# Patient Record
Sex: Female | Born: 1940
Health system: Southern US, Community
[De-identification: ages and names within clinical notes are randomized; demographics above are authoritative.]

## PROBLEM LIST (undated history)

## (undated) DIAGNOSIS — M67919 Unspecified disorder of synovium and tendon, unspecified shoulder: Secondary | ICD-10-CM

## (undated) DIAGNOSIS — M159 Polyosteoarthritis, unspecified: Secondary | ICD-10-CM

## (undated) DIAGNOSIS — K449 Diaphragmatic hernia without obstruction or gangrene: Secondary | ICD-10-CM

## (undated) DIAGNOSIS — I10 Essential (primary) hypertension: Secondary | ICD-10-CM

## (undated) DIAGNOSIS — K644 Residual hemorrhoidal skin tags: Secondary | ICD-10-CM

## (undated) DIAGNOSIS — M1990 Unspecified osteoarthritis, unspecified site: Secondary | ICD-10-CM

## (undated) DIAGNOSIS — I8390 Asymptomatic varicose veins of unspecified lower extremity: Secondary | ICD-10-CM

## (undated) DIAGNOSIS — M25512 Pain in left shoulder: Secondary | ICD-10-CM

## (undated) DIAGNOSIS — M719 Bursopathy, unspecified: Secondary | ICD-10-CM

## (undated) DIAGNOSIS — H269 Unspecified cataract: Secondary | ICD-10-CM

## (undated) DIAGNOSIS — D126 Benign neoplasm of colon, unspecified: Secondary | ICD-10-CM

## (undated) DIAGNOSIS — M25559 Pain in unspecified hip: Secondary | ICD-10-CM

## (undated) DIAGNOSIS — N72 Inflammatory disease of cervix uteri: Secondary | ICD-10-CM

## (undated) DIAGNOSIS — J3489 Other specified disorders of nose and nasal sinuses: Secondary | ICD-10-CM

## (undated) DIAGNOSIS — K2100 Gastro-esophageal reflux disease with esophagitis, without bleeding: Secondary | ICD-10-CM

## (undated) DIAGNOSIS — M25511 Pain in right shoulder: Secondary | ICD-10-CM

## (undated) DIAGNOSIS — M949 Disorder of cartilage, unspecified: Secondary | ICD-10-CM

## (undated) DIAGNOSIS — M25579 Pain in unspecified ankle and joints of unspecified foot: Secondary | ICD-10-CM

## (undated) DIAGNOSIS — E669 Obesity, unspecified: Secondary | ICD-10-CM

## (undated) DIAGNOSIS — M899 Disorder of bone, unspecified: Secondary | ICD-10-CM

## (undated) DIAGNOSIS — H6120 Impacted cerumen, unspecified ear: Secondary | ICD-10-CM

## (undated) DIAGNOSIS — E559 Vitamin D deficiency, unspecified: Secondary | ICD-10-CM

## (undated) DIAGNOSIS — K219 Gastro-esophageal reflux disease without esophagitis: Secondary | ICD-10-CM

## (undated) DIAGNOSIS — M25519 Pain in unspecified shoulder: Secondary | ICD-10-CM

## (undated) DIAGNOSIS — N6019 Diffuse cystic mastopathy of unspecified breast: Secondary | ICD-10-CM

## (undated) HISTORY — PX: UPPER GASTROINTESTINAL ENDOSCOPY: SHX188

## (undated) HISTORY — DX: Other specified disorders of nose and nasal sinuses: J34.89

## (undated) HISTORY — DX: Pain in unspecified shoulder: M25.519

## (undated) HISTORY — DX: Pain in unspecified ankle and joints of unspecified foot: M25.579

## (undated) HISTORY — DX: Pain in right shoulder: M25.511

## (undated) HISTORY — DX: Disorder of bone, unspecified: M89.9

## (undated) HISTORY — DX: Essential (primary) hypertension: I10

## (undated) HISTORY — DX: Residual hemorrhoidal skin tags: K64.4

## (undated) HISTORY — DX: Polyosteoarthritis, unspecified: M15.9

## (undated) HISTORY — DX: Bursopathy, unspecified: M71.9

## (undated) HISTORY — DX: Disorder of cartilage, unspecified: M94.9

## (undated) HISTORY — DX: Pain in unspecified hip: M25.559

## (undated) HISTORY — DX: Diffuse cystic mastopathy of unspecified breast: N60.19

## (undated) HISTORY — DX: Inflammatory disease of cervix uteri: N72

## (undated) HISTORY — DX: Obesity, unspecified: E66.9

## (undated) HISTORY — DX: Gastro-esophageal reflux disease with esophagitis, without bleeding: K21.00

## (undated) HISTORY — DX: Vitamin D deficiency, unspecified: E55.9

## (undated) HISTORY — DX: Pain in left shoulder: M25.512

## (undated) HISTORY — DX: Unspecified disorder of synovium and tendon, unspecified shoulder: M67.919

## (undated) HISTORY — DX: Gastro-esophageal reflux disease without esophagitis: K21.9

## (undated) HISTORY — DX: Benign neoplasm of colon, unspecified: D12.6

## (undated) HISTORY — DX: Unspecified cataract: H26.9

## (undated) HISTORY — DX: Gastro-esophageal reflux disease with esophagitis: K21.0

## (undated) HISTORY — DX: Diaphragmatic hernia without obstruction or gangrene: K44.9

## (undated) HISTORY — DX: Unspecified osteoarthritis, unspecified site: M19.90

## (undated) HISTORY — DX: Impacted cerumen, unspecified ear: H61.20

## (undated) HISTORY — DX: Asymptomatic varicose veins of unspecified lower extremity: I83.90

---
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
## 1988-10-02 HISTORY — PX: FLEXIBLE SIGMOIDOSCOPY: SHX1649

## 1998-04-30 ENCOUNTER — Other Ambulatory Visit: Admission: RE | Admit: 1998-04-30 | Discharge: 1998-04-30 | Payer: Self-pay | Admitting: Internal Medicine

## 1999-05-10 ENCOUNTER — Ambulatory Visit (HOSPITAL_COMMUNITY): Admission: RE | Admit: 1999-05-10 | Discharge: 1999-05-10 | Payer: Self-pay | Admitting: *Deleted

## 2000-06-01 ENCOUNTER — Other Ambulatory Visit: Admission: RE | Admit: 2000-06-01 | Discharge: 2000-06-01 | Payer: Self-pay | Admitting: Internal Medicine

## 2001-07-03 LAB — HM PAP SMEAR

## 2002-05-28 ENCOUNTER — Ambulatory Visit (HOSPITAL_COMMUNITY): Admission: RE | Admit: 2002-05-28 | Discharge: 2002-05-28 | Payer: Self-pay | Admitting: *Deleted

## 2002-05-28 ENCOUNTER — Encounter (INDEPENDENT_AMBULATORY_CARE_PROVIDER_SITE_OTHER): Payer: Self-pay | Admitting: Specialist

## 2003-10-03 HISTORY — PX: MOUTH SURGERY: SHX715

## 2004-10-02 HISTORY — PX: COLONOSCOPY: SHX174

## 2005-07-25 ENCOUNTER — Ambulatory Visit (HOSPITAL_COMMUNITY): Admission: RE | Admit: 2005-07-25 | Discharge: 2005-07-25 | Payer: Self-pay | Admitting: *Deleted

## 2009-02-02 ENCOUNTER — Encounter: Admission: RE | Admit: 2009-02-02 | Discharge: 2009-02-02 | Payer: Self-pay | Admitting: Internal Medicine

## 2009-09-21 ENCOUNTER — Encounter: Payer: Self-pay | Admitting: Internal Medicine

## 2010-01-28 ENCOUNTER — Encounter: Payer: Self-pay | Admitting: Internal Medicine

## 2010-05-25 ENCOUNTER — Encounter (INDEPENDENT_AMBULATORY_CARE_PROVIDER_SITE_OTHER): Payer: Self-pay | Admitting: *Deleted

## 2010-05-25 ENCOUNTER — Encounter: Payer: Self-pay | Admitting: Internal Medicine

## 2010-06-24 ENCOUNTER — Encounter (INDEPENDENT_AMBULATORY_CARE_PROVIDER_SITE_OTHER): Payer: Self-pay | Admitting: *Deleted

## 2010-06-27 ENCOUNTER — Ambulatory Visit: Payer: Self-pay | Admitting: Internal Medicine

## 2010-07-12 ENCOUNTER — Ambulatory Visit: Payer: Self-pay | Admitting: Internal Medicine

## 2010-07-23 ENCOUNTER — Encounter: Payer: Self-pay | Admitting: Internal Medicine

## 2010-11-01 NOTE — Letter (Signed)
Summary: Previsit letter  [facility]  [address] [place], [place] [address]   Phone: [phone]  Fax: [phone]       05/25/2010 MRN: [id number]  Danielle Pierce [address] [place], [place]  [address]  Dear Danielle Pierce,  Welcome to the Gastroenterology Division at [facility].    You are scheduled to see a nurse for your pre-procedure visit on 06/27/2010 at 10:00am  on the 3rd floor at [facility], [address]  We ask that you try to arrive at our office 15 minutes prior to your appointment time to allow for check-in.  Your nurse visit will consist of discussing your medical and surgical history, your immediate family medical history, and your medications.    Please bring a complete list of all your medications or, if you prefer, bring the medication bottles and we will list them.  We will need to be aware of both prescribed and over the counter drugs.  We will need to know exact dosage information as well.  If you are on blood thinners (Coumadin, Plavix, Aggrenox, Ticlid, etc.) please call our office today/prior to your appointment, as we need to consult with your physician about holding your medication.   Please be prepared to read and sign documents such as consent forms, a financial agreement, and acknowledgement forms.  If necessary, and with your consent, a friend or relative is welcome to sit-in on the nurse visit with you.  Please bring your insurance card so that we may make a copy of it.  If your insurance requires a referral to see a specialist, please bring your referral form from your primary care physician.  No co-pay is required for this nurse visit.     If you cannot keep your appointment, please call [phone] to cancel or reschedule prior to your appointment date.  This allows [place] the opportunity to schedule an appointment for another patient in need of care.    Thank you for choosing [facility] for your medical  needs.  We appreciate the opportunity to care for you.  Please visit [place] at our website  to learn more about our practice.                     Sincerely.                                                                                                                   The Gastroenterology Division

## 2010-11-01 NOTE — Letter (Signed)
Summary: [facility]   Imported By: Lester Straughn 07/14/2010 09:09:31  _____________________________________________________________________  External Attachment:    Type:   Image     Comment:   External Document

## 2010-11-01 NOTE — Miscellaneous (Signed)
Summary: previsit prep/rm  Clinical Lists Changes  Medications: Added new medication of MOVIPREP 100 GM  SOLR (PEG-KCL-NACL-NASULF-NA ASC-C) As per prep instructions. - Signed Rx of MOVIPREP 100 GM  SOLR (PEG-KCL-NACL-NASULF-NA ASC-C) As per prep instructions.;  #1 x 0;  Signed;  Entered by: Sherren Kerns RN;  Authorized by: Iva Boop MD, [facility];  Method used: Electronically to [facility]*, [address], [place], [place]  [id number], Ph: [phone], Fax: [phone] Observations: Added new observation of ALLERGY REV: Done (06/27/2010 9:40) Added new observation of NKA: T (06/27/2010 9:40)    Prescriptions: MOVIPREP 100 GM  SOLR (PEG-KCL-NACL-NASULF-NA ASC-C) As per prep instructions.  #1 x 0   Entered by:   Sherren Kerns RN   Authorized by:   Iva Boop MD, [facility]   Signed by:   Sherren Kerns RN on 06/27/2010   Method used:   Electronically to        [facility]* (retail)       [address]       [place], [place]  [id number]       Ph: [phone]       Fax: [phone]   RxID:   [id number]

## 2010-11-01 NOTE — Letter (Signed)
Summary: [facility] Instructions  [facility]  [address] [place], [place] [address]   Phone: [phone]  Fax: [phone]       Danielle Pierce    1941-07-13    MRN: [id number]        Procedure Day Dorna Bloom:  Jake Shark  07/12/10     Arrival Time:  10:30AM     Procedure Time:  11:30AM     Location of Procedure:                    _ X_  [facility] (4th Floor)                       PREPARATION FOR COLONOSCOPY WITH MOVIPREP   Starting 5 days prior to your procedure 07/07/10 do not eat nuts, seeds, popcorn, corn, beans, peas,  salads, or any raw vegetables.  Do not take any fiber supplements (e.g. Metamucil, Citrucel, and Benefiber).  THE DAY BEFORE YOUR PROCEDURE         DATE: 07/11/10  DAY: MONDAY  1.  Drink clear liquids the entire day-NO SOLID FOOD  2.  Do not drink anything colored red or purple.  Avoid juices with pulp.  No orange juice.  3.  Drink at least 64 oz. (8 glasses) of fluid/clear liquids during the day to prevent dehydration and help the prep work efficiently.  CLEAR LIQUIDS INCLUDE: Water Jello Ice Popsicles Tea (sugar ok, no milk/cream) Powdered fruit flavored drinks Coffee (sugar ok, no milk/cream) Gatorade Juice: apple, white grape, white cranberry  Lemonade Clear bullion, consomm, broth Carbonated beverages (any kind) Strained chicken noodle soup Hard Candy                             4.  In the morning, mix first dose of MoviPrep solution:    Empty 1 Pouch A and 1 Pouch B into the disposable container    Add lukewarm drinking water to the top line of the container. Mix to dissolve    Refrigerate (mixed solution should be used within 24 hrs)  5.  Begin drinking the prep at 5:00 p.m. The MoviPrep container is divided by 4 marks.   Every 15 minutes drink the solution down to the next mark (approximately 8 oz) until the full liter is complete.   6.  Follow completed prep with 16 oz of clear liquid of your choice  (Nothing red or purple).  Continue to drink clear liquids until bedtime.  7.  Before going to bed, mix second dose of MoviPrep solution:    Empty 1 Pouch A and 1 Pouch B into the disposable container    Add lukewarm drinking water to the top line of the container. Mix to dissolve    Refrigerate  THE DAY OF YOUR PROCEDURE      DATE: 07/12/10  DAY: TUESDAY  Beginning at 6:30AM (5 hours before procedure):         1. Every 15 minutes, drink the solution down to the next mark (approx 8 oz) until the full liter is complete.  2. Follow completed prep with 16 oz. of clear liquid of your choice.    3. You may drink clear liquids until 9:30AM (2 HOURS BEFORE PROCEDURE).   MEDICATION INSTRUCTIONS  Unless otherwise instructed, you should take regular prescription medications with a small sip of water   as early as possible the morning of  your procedure.  Additional medication instructions: Hold losartan/HCTZ pill morning of procedure        OTHER INSTRUCTIONS  You will need a responsible adult at least 69 years of age to accompany you and drive you home.   This person must remain in the waiting room during your procedure.  Wear loose fitting clothing that is easily removed.  Leave jewelry and other valuables at home.  However, you may wish to bring a book to read or  an iPod/MP3 player to listen to music as you wait for your procedure to start.  Remove all body piercing jewelry and leave at home.  Total time from sign-in until discharge is approximately 2-3 hours.  You should go home directly after your procedure and rest.  You can resume normal activities the  day after your procedure.  The day of your procedure you should not:   Drive   Make legal decisions   Operate machinery   Drink alcohol   Return to work  You will receive specific instructions about eating, activities and medications before you leave.    The above instructions have been reviewed and  explained to me by  Sherren Kerns RN  June 27, 2010 10:01 AM      I fully understand and can verbalize these instructions _____________________________ Date _________

## 2010-11-01 NOTE — Letter (Signed)
Summary: [facility]   Imported By: Lester Taunton 07/14/2010 09:07:26  _____________________________________________________________________  External Attachment:    Type:   Image     Comment:   External Document

## 2010-11-01 NOTE — Letter (Signed)
Summary: Kimber Relic MD  Kimber Relic MD   Imported By: Lester Startex 07/14/2010 09:06:18  _____________________________________________________________________  External Attachment:    Type:   Image     Comment:   External Document

## 2010-11-01 NOTE — Procedures (Signed)
Summary: Colonoscopy  Patient: Danielle Pierce Note: All result statuses are Final unless otherwise noted.  Tests: (1) Colonoscopy (COL)   COL Colonoscopy           DONE      [facility]     [address]     [place], [place]  [address]           COLONOSCOPY PROCEDURE REPORT           PATIENT:  Danielle Pierce, Danielle Pierce  MR#:  [id number]     BIRTHDATE:  [birth year], 68 yrs. old  GENDER:  female     ENDOSCOPIST:  Iva Boop, MD, [facility]           PROCEDURE DATE:  07/12/2010     PROCEDURE:  Colonoscopy with snare polypectomy     ASA CLASS:  Class II     INDICATIONS:  surveillance and high-risk screening, history of     pre-cancerous (adenomatous) colon polyps, family history of colon     cancer prior diminutive adenoma in 2003, no polyps in 2006 (Dr.     Virginia Rochester).     father had colon cancer in his 71's     MEDICATIONS:   Fentanyl 50 mcg IV, Versed 5 mg IV           DESCRIPTION OF PROCEDURE:   After the risks benefits and     alternatives of the procedure were thoroughly explained, informed     consent was obtained.  Digital rectal exam was performed and     revealed no abnormalities.   The [id number] [id number] endoscope was     introduced through the anus and advanced to the cecum, which was     identified by both the appendix and ileocecal valve, without     limitations.  The quality of the prep was excellent, using     MoviPrep.  The instrument was then slowly withdrawn as the colon     was fully examined. Insertion: 1:00 minutes Withdrawal: 15:23     minutes     <<PROCEDUREIMAGES>>           FINDINGS:  There was a possible polyp seen in the in the ascending     colon. Sessile lesion, raised and irregular mucosa, not a     contiguous abnormality.  Suspected polyp was snared piecemeal     without cautery. Retrieval was successful. This was otherwise a     normal examination of the colon. Includes right colon     retroflexion.   Retroflexed views in the rectum revealed internal  hemorrhoids.    The scope was then withdrawn from the patient and     the procedure completed.           COMPLICATIONS:  None     ENDOSCOPIC IMPRESSION:     1) Possible polyp in the ascending colon - removed     2) Internal hemorrhoids     3) Otherwise normal examination, excellent prep     4) personal history of diminutive adenoma (2003) and family     history of colon cancer (father)           REPEAT EXAM:  In for Colonoscopy, pending biopsy results.           Iva Boop, MD, [facility]           CC:  Murray Hodgkins, MD     The Patient  n.     eSIGNED:   Iva Boop at 07/12/2010 12:54 PM           Quenten Raven, [id number]  Note: An exclamation mark (!) indicates a result that was not dispersed into the flowsheet. Document Creation Date: 07/12/2010 12:56 PM _______________________________________________________________________  (1) Order result status: Final Collection or observation date-time: 07/12/2010 12:35 Requested date-time:  Receipt date-time:  Reported date-time:  Referring Physician:   Ordering Physician: Stan Head [phone]) Specimen Source:  Source: Launa Grill Order Number: [id number] Lab site:   Appended Document: Colonoscopy   Colonoscopy  Procedure date:  07/12/2010  Findings:          1) Possible polyp in the ascending colon - removed ADENOMA     2) Internal hemorrhoids     3) Otherwise normal examination, excellent prep     4) personal history of diminutive adenoma (2003) and family     history of colon cancer (father)  Comments:      Repeat colonoscopy in 3 years.   Due to shape of polyp and family hx  Procedures Next Due Date:    Colonoscopy: 07/2013

## 2010-11-01 NOTE — Letter (Signed)
Summary: Patient Notice- Polyp Results  [facility]  [address]   [place], [place] [address]   Phone: [phone]  Fax: [phone]        July 23, 2010 MRN: [id number]    Danielle Pierce [address] [place], [place]  [address]    Dear Ms. Skeels,  The polyp removed from your colon was adenomatous. This means that it was pre-cancerous or that  it had the potential to change into cancer over time.   I recommend that you have a repeat colonoscopy in 3 years to determine if you have developed any new polyps over time. If you develop any new rectal bleeding, abdominal pain or significant bowel habit changes, please contact us before then.  In addition to repeating colonoscopy, changing health habits may reduce your risk of having more colon polyps and possibly, colon cancer. You may lower your risk of future polyps and colon cancer by adopting healthy habits such as not smoking or using tobacco (if you do), being physically active, losing weight (if overweight), and eating a diet which includes fruits and vegetables and limits red meat.   Sincerely,  Iva Boop MD, [facility]  This letter has been electronically signed by your physician.  Appended Document: Patient Notice- Polyp Results letter mailed

## 2011-02-17 NOTE — Op Note (Signed)
   Danielle Pierce, FALTER                     ACCOUNT NO.:  [id number]   MEDICAL RECORD NO.:  [id number]                   PATIENT TYPE:  AMB   LOCATION:  ENDO                                 FACILITY[facility]  [facility]   PHYSICIAN:  Georgiana Spinner, M.D.                 DATE OF BIRTH:  1941/08/06   DATE OF PROCEDURE:  DATE OF DISCHARGE:                                 OPERATIVE REPORT   PROCEDURE:  Upper endoscopy.   INDICATIONS FOR PROCEDURE:  Small bowel polyps, Hemoccult positivity.   ANESTHESIA:  Demerol 40, Versed 4 mg.   DESCRIPTION OF PROCEDURE:  With the patient mildly sedated in the left  lateral decubitus position, the Olympus videoscopic endoscope was inserted  in the mouth and passed under direct vision through the esophagus which  appeared normal. The endoscope was advanced to the stomach, the fundus,  body, antrum, and duodenal bulb  all appeared normal. In the second portion  of the duodenum were two small polyps which we removed using biopsy forceps  technique. The endoscope was then pulled back into the stomach, placed in  retroflexion to view the stomach from below.  The endoscope was then  straightened and withdrawn taking circumferential views of the remaining  gastric and esophageal mucosa. The patient's vital signs and pulse oximeter  remained stable. The patient tolerated the procedure well without apparent  complications.   FINDINGS:  Duodenal polyps removed. Await biopsy report. Proceed to  colonoscopy as planned.                                                Georgiana Spinner, M.D.    GMO/MEDQ  D:  05/28/2002  T:  05/29/2002  Job:  [id number]

## 2011-02-17 NOTE — Op Note (Signed)
Danielle Pierce, Danielle Pierce            ACCOUNT NO.:  [id number]   MEDICAL RECORD NO.:  [id number]          PATIENT TYPE:  AMB   LOCATION:  ENDO                         FACILITY:  [facility]   PHYSICIAN:  Georgiana Spinner, M.D.    DATE OF BIRTH:  11-12-1940   DATE OF PROCEDURE:  07/25/2005  DATE OF DISCHARGE:                                 OPERATIVE REPORT   PROCEDURE:  Colonoscopy.   ENDOSCOPIST:  Georgiana Spinner, M.D.   INDICATIONS:  Colon polyps.   ANESTHESIA:  Demerol 60, Versed 6 mg.   PROCEDURE:  With the patient mildly sedated in the left lateral decubitus  position, the Olympus videoscopic colonoscope was inserted in the rectum and  passed under direct vision to the cecum, identified by ileocecal valve and  appendiceal orifice, both of which were photographed.  From this point, the  colonoscope was slowly withdrawn, taking circumferential views of the  colonic mucosa, stopping in the rectum, which appeared normal on direct and  showed hemorrhoids on retroflexed view.  The endoscope was straightened and  withdrawn.  The patient's vital signs and pulse oximetry remained stable.  The patient tolerated procedure well without apparent complications.   FINDINGS:  Internal hemorrhoids, otherwise an unremarkable colonoscopic  examination to cecum.   PLAN:  Consider repeat examination possibly in 5 years.           ______________________________  Georgiana Spinner, M.D.     GMO/MEDQ  D:  07/25/2005  T:  07/25/2005  Job:  [id number]

## 2011-02-17 NOTE — Op Note (Signed)
   TNAMEMARQUETTE, PIONTEK                     ACCOUNT NO.:  [id number]   MEDICAL RECORD NO.:  [id number]                   PATIENT TYPE:  AMB   LOCATION:  ENDO                                 [facility]ACILITY:  [facility]   PHYSICIAN:  Georgiana Spinner, M.D.                 DATE OF BIRTH:  Feb 18, 1941   DATE OF PROCEDURE:  DATE OF DISCHARGE:                                 OPERATIVE REPORT   PROCEDURE:  Colonoscopy with biopsy.   INDICATIONS FOR PROCEDURE:  Colon polyps.   ANESTHESIA:  None further given.   DESCRIPTION OF PROCEDURE:  With the patient mildly sedated in the left  lateral decubitus position, the Olympus videoscopic colonoscope was inserted  in the rectum and passed under direct vision to the cecum identified by the  ileocecal valve and appendiceal orifice. From this point, the colonoscope  was slowly withdrawn taking circumferential views of the entire colonic  mucosa stopping in the descending colon where a polyp was seen, photographed  and removed using hot biopsy forceps technique on a setting of 20:20 blended  current. The endoscope was then withdrawn all the way to the rectum which  appeared normal on direct and retroflexed view. The endoscope was  straightened and withdrawn.  The patient's vital signs and pulse oximeter  remained stable. The patient tolerated the procedure well without apparent  complications.   FINDINGS:  Small polyp of descending colon. Await biopsy report. The patient  will call me for results and followup with me as an outpatient. See  endoscopy note for further details as well.                                                 Georgiana Spinner, M.D.    GMO/MEDQ  D:  05/28/2002  T:  05/29/2002  Job:  [id number]

## 2011-09-13 DIAGNOSIS — N72 Inflammatory disease of cervix uteri: Secondary | ICD-10-CM

## 2011-09-13 HISTORY — DX: Inflammatory disease of cervix uteri: N72

## 2011-10-02 ENCOUNTER — Other Ambulatory Visit: Payer: Self-pay | Admitting: Internal Medicine

## 2011-10-02 ENCOUNTER — Ambulatory Visit
Admission: RE | Admit: 2011-10-02 | Discharge: 2011-10-02 | Disposition: A | Discharge status: Home / Self Care | Payer: Medicare Other | Source: Ambulatory Visit | Attending: Internal Medicine | Admitting: Internal Medicine

## 2011-11-07 DIAGNOSIS — H6120 Impacted cerumen, unspecified ear: Secondary | ICD-10-CM

## 2011-11-07 HISTORY — DX: Impacted cerumen, unspecified ear: H61.20

## 2012-12-24 ENCOUNTER — Other Ambulatory Visit: Payer: Self-pay | Admitting: *Deleted

## 2012-12-24 DIAGNOSIS — E669 Obesity, unspecified: Secondary | ICD-10-CM

## 2012-12-24 DIAGNOSIS — I10 Essential (primary) hypertension: Secondary | ICD-10-CM

## 2012-12-24 DIAGNOSIS — E119 Type 2 diabetes mellitus without complications: Secondary | ICD-10-CM

## 2012-12-30 ENCOUNTER — Other Ambulatory Visit: Payer: Self-pay | Admitting: Internal Medicine

## 2013-01-02 ENCOUNTER — Other Ambulatory Visit: Payer: Medicare Other

## 2013-01-02 DIAGNOSIS — E119 Type 2 diabetes mellitus without complications: Secondary | ICD-10-CM

## 2013-01-02 DIAGNOSIS — E669 Obesity, unspecified: Secondary | ICD-10-CM

## 2013-01-02 DIAGNOSIS — I10 Essential (primary) hypertension: Secondary | ICD-10-CM

## 2013-01-03 ENCOUNTER — Other Ambulatory Visit: Payer: Self-pay

## 2013-01-03 LAB — VITAMIN D 25 HYDROXY (VIT D DEFICIENCY, FRACTURES): Vit D, 25-Hydroxy: 29.2 ng/mL — ABNORMAL LOW (ref 30.0–100.0)

## 2013-01-03 LAB — COMPREHENSIVE METABOLIC PANEL
ALT: 18 IU/L (ref 0–32)
AST: 30 IU/L (ref 0–40)
Albumin/Globulin Ratio: 2.3 (ref 1.1–2.5)
GFR calc Af Amer: 60 mL/min/{1.73_m2} (ref >59)
GFR calc non Af Amer: 52 mL/min/{1.73_m2} — ABNORMAL LOW (ref >59)
Potassium: 4 mmol/L (ref 3.5–5.2)
Sodium: 143 mmol/L (ref 134–144)
Total Bilirubin: 0.5 mg/dL (ref 0.0–1.2)

## 2013-01-03 LAB — MICROALBUMIN / CREATININE URINE RATIO
Creatinine, Ur: 360.5 mg/dL — ABNORMAL HIGH (ref 15.0–278.0)
MICROALB/CREAT RATIO: 16.9 mg/g creat (ref 0.0–30.0)
Microalbumin, Urine: 61.1 ug/mL — ABNORMAL HIGH (ref 0.0–17.0)

## 2013-01-07 ENCOUNTER — Encounter: Payer: Self-pay | Admitting: Internal Medicine

## 2013-01-07 ENCOUNTER — Ambulatory Visit (INDEPENDENT_AMBULATORY_CARE_PROVIDER_SITE_OTHER): Payer: Medicare Other | Admitting: Internal Medicine

## 2013-01-07 VITALS — BP 150/80 | HR 76 | Temp 97.8°F | Resp 18 | Ht 65.0 in | Wt 237.0 lb

## 2013-01-07 DIAGNOSIS — E559 Vitamin D deficiency, unspecified: Secondary | ICD-10-CM

## 2013-01-07 DIAGNOSIS — E119 Type 2 diabetes mellitus without complications: Secondary | ICD-10-CM

## 2013-01-07 DIAGNOSIS — E669 Obesity, unspecified: Secondary | ICD-10-CM

## 2013-01-07 DIAGNOSIS — I10 Essential (primary) hypertension: Secondary | ICD-10-CM

## 2013-01-07 NOTE — Patient Instructions (Signed)
Continue current medications. Increase vitamin D to 2000 units daily

## 2013-02-20 ENCOUNTER — Encounter: Payer: Self-pay | Admitting: Internal Medicine

## 2013-02-20 DIAGNOSIS — E559 Vitamin D deficiency, unspecified: Secondary | ICD-10-CM | POA: Insufficient documentation

## 2013-02-20 DIAGNOSIS — I10 Essential (primary) hypertension: Secondary | ICD-10-CM | POA: Insufficient documentation

## 2013-02-20 DIAGNOSIS — E119 Type 2 diabetes mellitus without complications: Secondary | ICD-10-CM | POA: Insufficient documentation

## 2013-02-20 DIAGNOSIS — E1129 Type 2 diabetes mellitus with other diabetic kidney complication: Secondary | ICD-10-CM | POA: Insufficient documentation

## 2013-02-20 DIAGNOSIS — E669 Obesity, unspecified: Secondary | ICD-10-CM | POA: Insufficient documentation

## 2013-02-20 NOTE — Progress Notes (Signed)
Date: 02/20/2013  MRN:  [id number] Name:  Danielle Pierce Sex:  female Age:  72 y.o. DOB:Feb 22, 1941   [facility] #:     [phone]                  Provider:  Murray Hodgkins, MD  Emergency Contacts: Contact Information   Name Relation Home Work Mobile   Pierce,Danielle Spouse [phone]  [phone]   Pierce, Danielle Daughter [phone]  [phone]      Code Status: full  Allergies:No Known Allergies   Chief Complaint  Patient presents with  . Medical Managment of Chronic Issues  . fingers have been going numb     HPI:  Unspecified essential hypertension: controlled  Type II or unspecified type diabetes mellitus without mention of complication, not stated as uncontrolled: controlled  Obesity, unspecified: no weight loss  Unspecified vitamin D deficiency: still low. Irregular use of supplement    Past Medical History  Diagnosis Date  . GERD (gastroesophageal reflux disease)   . Diabetes mellitus without complication   . Unspecified vitamin D deficiency   . Hypertension   . Fibrocystic breast   . Hiatal hernia   . Arthritis     Ankle  . Obesity, unspecified     Past Surgical History  Procedure Laterality Date  . Mouth surgery  2005     Procedures: 1990-Flex ZDG:[id number] 1993-BE:normal 2000-EGD:hiatal hernia, duodenal polyps 2000-Colonoscopy:cecal polyp; hemorrhoids 2003-Colonoscopy-Polyp 2006-Colonoscopy [place]) 02/26/2008 Mammogram  02/02/2009 Chest x-ray mild cardiomegaly 03/02/2009 Mammogram negative 03/03/2010 Mammogram Normal  05/31/2010 Bone Density  03/06/2011 Mammogram Normal  10/02/11 xray right hip: normal  03/12/12 Mammogram: normal  05/04/12 bone density: T is -2.1   Consultants: Ophthalmology-Dr.Shapiro GI-Dr.Orr    Current Outpatient Prescriptions  Medication Sig Dispense Refill  . aspirin 81 MG tablet Take 81 mg by mouth daily. Take 1 tablet once a day.      . calcium carbonate (TUMS) 500 MG chewable tablet Chew 2 tablets by  mouth 3 (three) times daily. Chew 2 tablets three times a day for indigestion.      . Calcium Carbonate-Vitamin D (CALCIUM-VITAMIN D) 500-200 MG-UNIT per tablet Take 1 tablet by mouth 2 (two) times daily. Take 1 tablet twice a day to help bones.      . Carboxymethylcell-Hypromellose (GENTEAL) 0.25-0.3 % GEL Apply 1 application to eye as needed. Use 1 application to each eye as needed to alleviate irritation.      . Cholecalciferol (VITAMIN D-3 PO) Take 2 tablets by mouth daily. Take 2 tablets once a day for vitamin D.      . ibuprofen (ADVIL,MOTRIN) 800 MG tablet TAKE 1 TABLET UP TO 4 TIMES A DAY AS NEEDED FOR PAIN.  120 tablet  5  . losartan-hydrochlorothiazide (HYZAAR) 50-12.5 MG per tablet Take 1 tablet by mouth daily. Take 1 tablet once a day to control blood pressure.      . vitamin B-12 (CYANOCOBALAMIN) 1000 MCG tablet Take 1,000 mcg by mouth daily. Take 1 tablet once a day.      . vitamin C (ASCORBIC ACID) 500 MG tablet Take 500 mg by mouth daily. Take 1 tablet once a day.      . vitamin E 400 UNIT capsule Take 400 Units by mouth daily. Take 1 capsule once a day.       No current facility-administered medications for this visit.    Immunization History  Administered Date(s) Administered  . Td 04/30/1998     Diet: no concentrated sweets  History  Substance  Use Topics  . Smoking status: Never Smoker   . Smokeless tobacco: Not on file  . Alcohol Use: No    Family History  Problem Relation Age of Onset  . Hypertension Mother   . Kidney disease Mother     Renal failure  . Cancer Brother     Lymphoma  . ADD / ADHD Son   . Seizures Son   . Hypertension Sister   . Hypertension Sister   . Hypertension Sister   . Early death Brother     Some type of accident    Review of Systems Constitutional: negative Eyes: negative Ears, nose, mouth, throat, and face: negative Respiratory: negative Cardiovascular: negative Gastrointestinal: positive for  constipation Genitourinary:negative Integument/breast: negative Hematologic/lymphatic: negative Musculoskeletal:positive for arthralgias, back pain and myalgias Neurological: negative Behavioral/Psych: negative Endocrine: diabetic Allergic/Immunologic: negative  Vital signs: BP 150/80  Pulse 76  Temp(Src) 97.8 F (36.6 C) (Oral)  Resp 18  Ht 5\' 5"  (1.651 m)  Wt 237 lb (107.502 kg)  BMI 39.44 kg/m2  General Appearance:    Alert, cooperative, no distress, appears stated age. Obese.  Head:    Normocephalic, without obvious abnormality, atraumatic  Eyes:    PERRL, conjunctiva/corneas clear, EOM's intact, fundi    benign, both eyes  Ears:    Normal TM's and external ear canals, both ears  Nose:   Nares normal, septum midline, mucosa normal, no drainage    or sinus tenderness  Throat:   Lips, mucosa, and tongue normal; teeth and gums normal  Neck:   Supple, symmetrical, trachea midline, no adenopathy;    thyroid:  no enlargement/tenderness/nodules; no carotid   bruit or JVD  Back:     Symmetric, no curvature, ROM normal, no CVA tenderness  Lungs:     Clear to auscultation bilaterally, respirations unlabored  Chest Wall:    No tenderness or deformity   Heart:    Regular rate and rhythm, S1 and S2 normal, no murmur, rub   or gallop  Breast Exam:    No tenderness, masses, or nipple abnormality  Abdomen:     Soft, non-tender, bowel sounds active all four quadrants,    no masses, no organomegaly  Genitalia:    Normal female without lesion, discharge or tenderness  Rectal:    Normal tone, normal prostate, no masses or tenderness;   guaiac negative stool  Extremities:   Extremities normal, atraumatic, no cyanosis or edema  Pulses:   2+ and symmetric all extremities  Skin:   Skin color, texture, turgor normal, no rashes or lesions  Lymph nodes:   Cervical, supraclavicular, and axillary nodes normal  Neurologic:   CNII-XII intact, normal strength, sensation and reflexes    throughout      Screening Score  MMS    PHQ2 0  PHQ9     Fall Risk    BIMS    Lab reports 01/27/2011 BMP Glucose 96 Bun 20 Creatinine 0.99          HA1C 6.4          Lipid Panel Cholesterol 186 Triglycerides 66 Hdl 65 Ldl 108          Vitamin D 25 hydroxy 24.4  06/02/2011   BMP: Glucose 100, BUN 16, Creatinine 1.02,          HgbA1C 6.2         Vit D 26.8 09/11/2011 CMP; Glucose 105, BUN 18, Creatinine 1.04 A1c 6.1 Lipid Panel; Cholesterol 193, Triglycerides 61, HDL 64, LDL  117 Microalbumin 36.0, TSH 2.340 01/22/2012  BMP: glucose 104, BUN 19, Creatinine 1.05 A1C: 6.2 Appointment on 01/02/2013  Component Date Value Range Status  . Glucose 01/02/2013 104* 65 - 99 mg/dL Final  . BUN 19/14/7829 13  8 - 27 mg/dL Final  . Creatinine, Ser 01/02/2013 1.08* 0.57 - 1.00 mg/dL Final  . GFR calc non Af Amer 01/02/2013 52* >59 mL/min/1.73 Final  . GFR calc Af Amer 01/02/2013 60  >59 mL/min/1.73 Final  . BUN/Creatinine Ratio 01/02/2013 12  11 - 26 Final  . Sodium 01/02/2013 143  134 - 144 mmol/L Final  . Potassium 01/02/2013 4.0  3.5 - 5.2 mmol/L Final  . Chloride 01/02/2013 105  97 - 108 mmol/L Final  . CO2 01/02/2013 23  19 - 28 mmol/L Final  . Calcium 01/02/2013 9.5  8.6 - 10.2 mg/dL Final  . Total Protein 01/02/2013 6.2  6.0 - 8.5 g/dL Final  . Albumin 56/21/3086 4.3  3.5 - 4.8 g/dL Final  . Globulin, Total 01/02/2013 1.9  1.5 - 4.5 g/dL Final  . Albumin/Globulin Ratio 01/02/2013 2.3  1.1 - 2.5 Final  . Total Bilirubin 01/02/2013 0.5  0.0 - 1.2 mg/dL Final  . Alkaline Phosphatase 01/02/2013 82  39 - 117 IU/L Final  . AST 01/02/2013 30  0 - 40 IU/L Final  . ALT 01/02/2013 18  0 - 32 IU/L Final  . Cholesterol, Total 01/02/2013 193  100 - 199 mg/dL Final  . Triglycerides 01/02/2013 57  0 - 149 mg/dL Final  . HDL 57/84/6962 55  >39 mg/dL Final   Comment: According to ATP-III Guidelines, HDL-C >59 mg/dL is considered a                          negative risk factor for CHD.  Marland Kitchen VLDL Cholesterol  Cal 01/02/2013 11  5 - 40 mg/dL Final  . LDL Calculated 01/02/2013 952* 0 - 99 mg/dL Final  . Chol/HDL Ratio 01/02/2013 3.5  0.0 - 4.4 ratio units Final  . Vit D, 25-Hydroxy 01/02/2013 29.2* 30.0 - 100.0 ng/mL Final   Comment: Vitamin D deficiency has been defined by the Institute of                          Medicine and an Endocrine Society practice guideline as a                          level of serum 25-OH vitamin D less than 20 ng/mL (1,2).                          The Endocrine Society went on to further define vitamin D                          insufficiency as a level between 21 and 29 ng/mL (2).                          1. IOM (Institute of Medicine). 2010. Dietary reference                             intakes for calcium and D. [place]: The  [employer].                          2. Holick MF, Binkley , Bischoff-Ferrari HA, et al.                             Evaluation, treatment, and prevention of vitamin D                             deficiency: an Endocrine Society clinical practice                             guideline. JCEM. 2011 Jul; 96(7):1911-30.  . Creatinine, Ur 01/02/2013 360.5* 15.0 - 278.0 mg/dL Final  . Microalbum.,U,Random 01/02/2013 61.1* 0.0 - 17.0 ug/mL Final  . MICROALB/CREAT RATIO 01/02/2013 16.9  0.0 - 30.0 mg/g creat Final  01/07/13 EKG: rate 56. NSR. Left atrial abnormality.   Annual summary: Hospitalizations: none in the last year Infection History: none significant Functional assessment: independent in all ADL Areas of potential improvement: none Rehabilitation Potential: not pertinent Prognosis for survival: good  Plan: 1. Unspecified essential hypertension controlled - EKG 12-Lead  2. Type II or unspecified type diabetes mellitus without mention of complication, not stated as uncontrolled controlled - Hemoglobin A1c; Future - Basic Metabolic Panel; Future  3. Obesity, unspecified Encouraged  weight loss  4. Unspecified vitamin D deficiency Take supplements

## 2013-03-06 ENCOUNTER — Encounter: Payer: Self-pay | Admitting: *Deleted

## 2013-03-07 ENCOUNTER — Ambulatory Visit (INDEPENDENT_AMBULATORY_CARE_PROVIDER_SITE_OTHER): Payer: Medicare Other | Admitting: Internal Medicine

## 2013-03-07 ENCOUNTER — Encounter: Payer: Self-pay | Admitting: Internal Medicine

## 2013-03-07 ENCOUNTER — Ambulatory Visit
Admission: RE | Admit: 2013-03-07 | Discharge: 2013-03-07 | Disposition: A | Discharge status: Home / Self Care | Payer: Medicare Other | Source: Ambulatory Visit | Attending: Internal Medicine | Admitting: Internal Medicine

## 2013-03-07 VITALS — BP 128/72 | HR 60 | Temp 98.0°F | Resp 16 | Ht 65.0 in | Wt 229.0 lb

## 2013-03-07 DIAGNOSIS — K625 Hemorrhage of anus and rectum: Secondary | ICD-10-CM | POA: Insufficient documentation

## 2013-03-07 DIAGNOSIS — M25569 Pain in unspecified knee: Secondary | ICD-10-CM

## 2013-03-07 DIAGNOSIS — M25561 Pain in right knee: Secondary | ICD-10-CM

## 2013-03-07 DIAGNOSIS — M79609 Pain in unspecified limb: Secondary | ICD-10-CM

## 2013-03-07 NOTE — Patient Instructions (Addendum)
Let us know if you develop weakness, dizziness or your bleeding persists in your stool.  Watch for dark stool or bright red blood.  We will plan to keep the plan to see Dr. Leone Payor in October for your routine colonoscopy unless these symptoms persist or your blood counts have dropped.

## 2013-03-07 NOTE — Progress Notes (Signed)
Patient ID: Danielle Pierce, female   DOB: 01-29-1941, 72 y.o.   MRN: [id number]   No Known Allergies  Chief Complaint  Patient presents with  . Acute Visit    Patient fell and hurt her right knee and left pinkey .    Marland Kitchen Rectal Bleeding    has hemorrhoids    HPI: Patient is a 72 y.o. female seen in the office today for acute visit for two things, possible fracture of left 5th digit and rectal bleeding.    Larey Seat and hurt her right knee 3 weeks ago.  Stepped on her long pants as she was walking.  Was trying to move old tv with her daughter just over a week ago, pinky was in the way as moved it, swelled, sore between 4th and 5th digit.    This am, when had BM, had a gush of blood.  BM was red.  Does have hemorrhoids.  This is the first episode like this.  Has been pushing lawnmower up hill, cutting shrubbery.  No abdominal pain.  Has had some discomfort around her lower back.  Due for cscope 10/14 due to h/o adenomatous polyp and fhx.  Get q 3 years.  Had negative hemoccult last visit here.  Has not had further bleeding since the episode with BM. Used a suppository for the hemorrhoids afterwards.  Has not had other GI problems in the past.   Discussed tylenol instead of ibuprofen due to bleeding risk.    Review of Systems:  Review of Systems  Constitutional: Negative for malaise/fatigue.  HENT: Negative for congestion.   Eyes: Negative for blurred vision.  Respiratory: Negative for cough and shortness of breath.   Cardiovascular: Negative for chest pain.  Gastrointestinal: Positive for constipation and blood in stool. Negative for heartburn, abdominal pain and melena.  Genitourinary: Negative for dysuria.  Musculoskeletal: Positive for falls and joint pain.  Skin: Negative for rash.  Neurological: Negative for dizziness.     Past Medical History  Diagnosis Date  . GERD (gastroesophageal reflux disease)   . Diabetes mellitus without complication   . Unspecified vitamin D  deficiency   . Hypertension   . Fibrocystic breast   . Hiatal hernia   . Arthritis     Ankle  . Obesity, unspecified   . Other diseases of nasal cavity and sinuses(478.19)   . Other voice and resonance disorders   . Impacted cerumen 11/07/2011  . Cervicitis and endocervicitis 09/13/2011  . Pain in joint, pelvic region and thigh   . Pain in joint, ankle and foot   . Disorder of bone and cartilage, unspecified   . Pain in joint, shoulder region   . Benign neoplasm of colon   . Reflux esophagitis   . External hemorrhoids without mention of complication   . Benign neoplasm of skin of trunk, except scrotum   . Unspecified essential hypertension   . Asymptomatic varicose veins   . Generalized osteoarthrosis, unspecified site   . Obesity, unspecified   . Disorders of bursae and tendons in shoulder region, unspecified    Past Surgical History  Procedure Laterality Date  . Mouth surgery  2005    DR LUTINS   Social History:   reports that she has never smoked. She does not have any smokeless tobacco history on file. She reports that she does not drink alcohol or use illicit drugs.  Family History  Problem Relation Age of Onset  . Hypertension Mother   . Kidney disease Mother  Renal failure  . Cancer Brother     Lymphoma  . ADD / ADHD Son   . Seizures Son   . Hypertension Sister   . Hypertension Sister   . Hypertension Sister   . Early death Brother     Some type of accident    Medications: Patient's Medications  New Prescriptions   No medications on file  Previous Medications   ASPIRIN 81 MG TABLET    Take 81 mg by mouth daily. Take 1 tablet once a day.   CALCIUM CARBONATE-VITAMIN D (CALCIUM-VITAMIN D) 500-200 MG-UNIT PER TABLET    Take 1 tablet by mouth 2 (two) times daily. Take 1 tablet twice a day to help bones.   CARBOXYMETHYLCELL-HYPROMELLOSE (GENTEAL) 0.25-0.3 % GEL    Apply 1 application to eye as needed. Use 1 application to each eye as needed to alleviate  irritation.   CHOLECALCIFEROL (VITAMIN D-3 PO)    Take 2 tablets by mouth daily. Take 2 tablets once a day for vitamin D.   HYDROCORTISONE (PROCTOCREAM-HC) 2.5 % RECTAL CREAM    Place rectally 2 (two) times daily. Apply up to 4 times a day needed to hemorrhoids   IBUPROFEN (ADVIL,MOTRIN) 800 MG TABLET    TAKE 1 TABLET UP TO 4 TIMES A DAY AS NEEDED FOR PAIN.   LOSARTAN-HYDROCHLOROTHIAZIDE (HYZAAR) 50-12.5 MG PER TABLET    Take 1 tablet by mouth daily. Take 1 tablet once a day to control blood pressure.   VITAMIN B-12 (CYANOCOBALAMIN) 1000 MCG TABLET    Take 1,000 mcg by mouth daily. Take 1 tablet once a day.   VITAMIN C (ASCORBIC ACID) 500 MG TABLET    Take 500 mg by mouth daily. Take 1 tablet once a day.   VITAMIN E 400 UNIT CAPSULE    Take 400 Units by mouth daily. Take 1 capsule once a day.  Modified Medications   No medications on file  Discontinued Medications   CALCIUM CARBONATE (TUMS) 500 MG CHEWABLE TABLET    Chew 2 tablets by mouth 3 (three) times daily. Chew 2 tablets three times a day for indigestion.   DOCUSATE SODIUM (COLACE) 100 MG CAPSULE    Take 100 mg by mouth 2 (two) times daily. Take 1 two times a day, hold for diarrhea   FLUTICASONE (FLOVENT DISKUS) 50 MCG/BLIST DISKUS INHALER    Inhale 1 puff into the lungs 2 (two) times daily. One spray   SODIUM CHLORIDE (NASAL MOISTURIZER) 0.65 % NASAL SPRAY    Place 1 spray into the nose as needed for congestion. Apply one drop in each nostril 2 times a day     Physical Exam: Filed Vitals:   03/07/13 1014  BP: 128/72  Pulse: 60  Temp: 98 F (36.7 C)  TempSrc: Oral  Resp: 16  Height: 5\' 5"  (1.651 m)  Weight: 229 lb (103.874 kg)   Physical Exam  Constitutional: No distress.  Cardiovascular: Normal rate, regular rhythm, normal heart sounds and intact distal pulses.   Pulmonary/Chest: Effort normal and breath sounds normal. No respiratory distress.  Abdominal: Soft. Bowel sounds are normal. She exhibits no distension. There is no  tenderness.  Genitourinary: Guaiac positive stool.  Hemorrhoids present  Musculoskeletal: Normal range of motion.  Tenderness of knee and swelling of 5th digit with ecchymoses  Neurological: She is alert.  Skin: Skin is warm and dry.      Labs reviewed: Basic Metabolic Panel:  Recent Labs  45/40/98 1000  NA 143  K 4.0  CL 105  CO2 23  GLUCOSE 104*  BUN 13  CREATININE 1.08*  CALCIUM 9.5   Liver Function Tests:  Recent Labs  01/02/13 1000  AST 30  ALT 18  ALKPHOS 82  BILITOT 0.5  PROT 6.2  Lipid Panel:  Recent Labs  01/02/13 1000  HDL 55  LDLCALC 127*  TRIG 57  CHOLHDL 3.5   Assessment/Plan 1. Rectal bleeding -likely hemorrhoidal -advised to let us know if this recurs or is more significant or she develops chest pain, shortness of breath, abdominal pain, or weakness with it - f/u labs:  CBC with Differential -due for cscope in 10/14  2. Right knee pain -s/p fall--has swelling, tenderness, is able to bear weight--check xrays to ensure no acute traumatic injury - DG Knee Complete 4 Views Right; Future  3. Pain in finger, left -obtain xrays due to injury - DG Hand Complete Left; Future  Labs/tests ordered:  Cbc with diff, right knee xrays and left hand xrays

## 2013-03-08 LAB — CBC WITH DIFFERENTIAL/PLATELET
Basophils Absolute: 0 10*3/uL (ref 0.0–0.2)
Basos: 0 % (ref 0–3)
Eos: 2 % (ref 0–5)
Eosinophils Absolute: 0.1 10*3/uL (ref 0.0–0.4)
HCT: 37.4 % (ref 34.0–46.6)
Hemoglobin: 12.8 g/dL (ref 11.1–15.9)
Immature Grans (Abs): 0 10*3/uL (ref 0.0–0.1)
Immature Granulocytes: 0 % (ref 0–2)
Lymphocytes Absolute: 2.9 10*3/uL (ref 0.7–3.1)
Lymphs: 40 % (ref 14–46)
MCH: 31.1 pg (ref 26.6–33.0)
MCHC: 34.2 g/dL (ref 31.5–35.7)
MCV: 91 fL (ref 79–97)
Monocytes Absolute: 0.6 10*3/uL (ref 0.1–0.9)
Monocytes: 8 % (ref 4–12)
Neutrophils Absolute: 3.6 10*3/uL (ref 1.4–7.0)
Neutrophils Relative %: 50 % (ref 40–74)
RBC: 4.11 x10E6/uL (ref 3.77–5.28)
RDW: 14.9 % (ref 12.3–15.4)
WBC: 7.3 10*3/uL (ref 3.4–10.8)

## 2013-04-07 ENCOUNTER — Other Ambulatory Visit: Payer: Self-pay | Admitting: *Deleted

## 2013-04-07 ENCOUNTER — Encounter: Payer: Self-pay | Admitting: *Deleted

## 2013-04-08 ENCOUNTER — Encounter: Payer: Self-pay | Admitting: Internal Medicine

## 2013-04-08 ENCOUNTER — Ambulatory Visit (INDEPENDENT_AMBULATORY_CARE_PROVIDER_SITE_OTHER): Payer: Medicare Other | Admitting: Internal Medicine

## 2013-04-08 VITALS — BP 150/80 | HR 53 | Temp 98.2°F | Resp 16 | Ht 65.0 in | Wt 231.8 lb

## 2013-04-08 DIAGNOSIS — Z9889 Other specified postprocedural states: Secondary | ICD-10-CM

## 2013-04-08 DIAGNOSIS — E119 Type 2 diabetes mellitus without complications: Secondary | ICD-10-CM

## 2013-04-08 DIAGNOSIS — I10 Essential (primary) hypertension: Secondary | ICD-10-CM

## 2013-04-08 DIAGNOSIS — E669 Obesity, unspecified: Secondary | ICD-10-CM

## 2013-04-08 DIAGNOSIS — K625 Hemorrhage of anus and rectum: Secondary | ICD-10-CM

## 2013-04-08 DIAGNOSIS — E785 Hyperlipidemia, unspecified: Secondary | ICD-10-CM

## 2013-04-08 DIAGNOSIS — E559 Vitamin D deficiency, unspecified: Secondary | ICD-10-CM

## 2013-04-08 NOTE — Progress Notes (Signed)
Subjective:    Patient ID: Danielle Pierce, female    DOB: [birth year], 72 y.o.   MRN: [id number]  HPI Type II or unspecified type diabetes mellitus without mention of complication, not stated as uncontrolled  Unspecified essential hypertension  Unspecified vitamin D deficiency  S/P colonoscopy - Plan: Ambulatory referral to Gastroenterology  Obesity, unspecified  Current Outpatient Prescriptions on File Prior to Visit  Medication Sig Dispense Refill  . aspirin 81 MG tablet Take 81 mg by mouth daily. Take 1 tablet once a day.      . Calcium Carbonate-Vitamin D (CALCIUM-VITAMIN D) 500-200 MG-UNIT per tablet Take 1 tablet by mouth 2 (two) times daily. Take 1 tablet twice a day to help bones.      . Carboxymethylcell-Hypromellose (GENTEAL) 0.25-0.3 % GEL Apply 1 application to eye as needed. Use 1 application to each eye as needed to alleviate irritation.      . Cholecalciferol (VITAMIN D-3 PO) Take 2 tablets by mouth daily. Take 2 tablets once a day for vitamin D.      . ibuprofen (ADVIL,MOTRIN) 800 MG tablet TAKE 1 TABLET UP TO 4 TIMES A DAY AS NEEDED FOR PAIN.  120 tablet  5  . losartan-hydrochlorothiazide (HYZAAR) 50-12.5 MG per tablet Take 1 tablet by mouth daily. Take 1 tablet once a day to control blood pressure.      . vitamin B-12 (CYANOCOBALAMIN) 1000 MCG tablet Take 1,000 mcg by mouth daily. Take 1 tablet once a day.      . vitamin C (ASCORBIC ACID) 500 MG tablet Take 500 mg by mouth daily. Take 1 tablet once a day.      . vitamin E 400 UNIT capsule Take 400 Units by mouth daily. Take 1 capsule once a day.       No current facility-administered medications on file prior to visit.      Review of Systems  Constitutional: Negative.  Negative for activity change, appetite change and unexpected weight change.  HENT: Negative.   Eyes: Negative.   Respiratory: Negative.   Cardiovascular: Negative for chest pain, palpitations and leg swelling.  Gastrointestinal: Positive for  blood in stool. Negative for nausea, abdominal pain, diarrhea, abdominal distention and rectal pain.       Seen in this office 03/07/13 by Dr. Bufford Spikes. Hematochezia 03/07/13. No abdominal pain. Mild discomfort in lower back. She is due for colonoscopy October 2014 due to a history of adenomatous polyp and family history. She only had one episode of the blood in the stool. She is seeing Dr. Leone Payor in the past.  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 Still some discomfort, but no swelling.  Skin: Negative.   Hematological: Negative.   Psychiatric/Behavioral: Negative.        Objective:   Physical Exam  Constitutional: She is oriented to person, place, and time.  Obese.  HENT:  Head: Normocephalic and atraumatic.  Right Ear: External ear normal.  Left Ear: External ear normal.  Nose: Nose normal.  Mouth/Throat: Oropharynx is clear and moist.  Eyes: Conjunctivae and EOM are normal. Pupils are equal, round, and reactive to light.  Neck: No JVD present. No tracheal deviation present. No thyromegaly present.  Cardiovascular: Normal rate, regular rhythm, normal heart sounds and intact distal pulses.   Pulmonary/Chest: No respiratory distress. She has no wheezes. She has no rales. She exhibits no tenderness.  Abdominal:  Bowel sounds are normal. She exhibits no distension and no mass. There is no tenderness.  Musculoskeletal: Normal range of motion. She exhibits no edema and no tenderness.  Lymphadenopathy:    She has no cervical adenopathy.  Neurological: She is alert and oriented to person, place, and time. She has normal reflexes. No cranial nerve deficit.  Intact vibratory sensation.  Skin: No rash noted. No erythema. No pallor.  Psychiatric: She has a normal mood and affect. Her behavior is normal. Judgment and thought content normal.      Office Visit on 03/07/2013  Component Date Value Range Status  .  WBC 03/07/2013 7.3  3.4 - 10.8 x10E3/uL Final  . RBC 03/07/2013 4.11  3.77 - 5.28 x10E6/uL Final  . Hemoglobin 03/07/2013 12.8  11.1 - 15.9 g/dL Final  . HCT 16/07/9603 37.4  34.0 - 46.6 % Final  . MCV 03/07/2013 91  79 - 97 fL Final  . MCH 03/07/2013 31.1  26.6 - 33.0 pg Final  . MCHC 03/07/2013 34.2  31.5 - 35.7 g/dL Final  . RDW 54/06/8118 14.9  12.3 - 15.4 % Final  . Neutrophils Relative % 03/07/2013 50  40 - 74 % Final  . Lymphs 03/07/2013 40  14 - 46 % Final  . Monocytes 03/07/2013 8  4 - 12 % Final  . Eos 03/07/2013 2  0 - 5 % Final  . Basos 03/07/2013 0  0 - 3 % Final  . Neutrophils Absolute 03/07/2013 3.6  1.4 - 7.0 x10E3/uL Final  . Lymphocytes Absolute 03/07/2013 2.9  0.7 - 3.1 x10E3/uL Final  . Monocytes Absolute 03/07/2013 0.6  0.1 - 0.9 x10E3/uL Final  . Eosinophils Absolute 03/07/2013 0.1  0.0 - 0.4 x10E3/uL Final  . Basophils Absolute 03/07/2013 0.0  0.0 - 0.2 x10E3/uL Final  . Immature Granulocytes 03/07/2013 0  0 - 2 % Final  . Immature Grans (Abs) 03/07/2013 0.0  0.0 - 0.1 x10E3/uL Final       Assessment & Plan:  Type II or unspecified type diabetes mellitus without mention of complication, not stated as uncontrolled: controlled  Unspecified essential hypertension: Mild systolic blood pressure elevation.   Unspecified vitamin D deficiency: Vitamin supplements  S/P colonoscopy: Due to the acute nature of her rectal bleeding she should have an earlier appointment with Dr. Leone Payor .  Obesity, unspecified: Encouraged dietary compliance and weight loss   Rectal bleeding: Most likely from hemorrhoids, but her previous history of polyps is pertinent. We will refer to Dr. Leone Payor and let him decide if colonoscopy is to be scheduled sooner than anticipated.

## 2013-04-10 ENCOUNTER — Encounter: Payer: Self-pay | Admitting: Internal Medicine

## 2013-05-30 NOTE — Patient Instructions (Signed)
Continue current medications. 

## 2013-06-24 ENCOUNTER — Ambulatory Visit (AMBULATORY_SURGERY_CENTER): Payer: Medicare Other

## 2013-06-24 VITALS — Ht 64.5 in | Wt 234.8 lb

## 2013-06-24 DIAGNOSIS — Z800 Family history of malignant neoplasm of digestive organs: Secondary | ICD-10-CM

## 2013-06-24 DIAGNOSIS — Z860100 Personal history of colon polyps, unspecified: Secondary | ICD-10-CM

## 2013-06-24 MED ORDER — NA SULFATE-K SULFATE-MG SULF 17.5-3.13-1.6 GM/177ML PO SOLN: 1.0000 | Freq: Once | ORAL | Status: DC

## 2013-07-08 ENCOUNTER — Encounter: Payer: Self-pay | Admitting: Internal Medicine

## 2013-07-08 ENCOUNTER — Ambulatory Visit (AMBULATORY_SURGERY_CENTER): Payer: Medicare Other | Admitting: Internal Medicine

## 2013-07-08 VITALS — BP 135/79 | HR 64 | Temp 97.7°F | Resp 17 | Ht 64.5 in | Wt 234.0 lb

## 2013-07-08 DIAGNOSIS — Z860100 Personal history of colon polyps, unspecified: Secondary | ICD-10-CM

## 2013-07-08 DIAGNOSIS — Z800 Family history of malignant neoplasm of digestive organs: Secondary | ICD-10-CM | POA: Insufficient documentation

## 2013-07-08 DIAGNOSIS — K5730 Diverticulosis of large intestine without perforation or abscess without bleeding: Secondary | ICD-10-CM

## 2013-07-08 DIAGNOSIS — K644 Residual hemorrhoidal skin tags: Secondary | ICD-10-CM

## 2013-07-08 DIAGNOSIS — K648 Other hemorrhoids: Secondary | ICD-10-CM

## 2013-07-08 HISTORY — PX: COLONOSCOPY: SHX174

## 2013-07-08 MED ORDER — SODIUM CHLORIDE 0.9 % IV SOLN: 500.0000 mL | INTRAVENOUS | Status: DC

## 2013-07-08 NOTE — Progress Notes (Signed)
Patient did not experience any of the following events: a burn prior to discharge; a fall within the facility; wrong site/side/patient/procedure/implant event; or a hospital transfer or hospital admission upon discharge from the facility. (G8907)Patient did not have preoperative order for IV antibiotic SSI prophylaxis. (G8918) ewm 

## 2013-07-08 NOTE — Patient Instructions (Addendum)
No polyps today.  You have diverticulosis and hemorrhoids.  If you have hemorrhoid problems (swelling, itching, bleeding) I am able to treat those with an in-office procedure. If you like, please call my office at [phone] to schedule an appointment and I can evaluate you further.  Next routine colonoscopy 2019.  I appreciate the opportunity to care for you. Iva Boop, MD, FACG   YOU HAD AN ENDOSCOPIC PROCEDURE TODAY AT THE [facility]: Refer to the procedure report that was given to you for any specific questions about what was found during the examination.  If the procedure report does not answer your questions, please call your gastroenterologist to clarify.  If you requested that your care partner not be given the details of your procedure findings, then the procedure report has been included in a sealed envelope for you to review at your convenience later.  YOU SHOULD EXPECT: Some feelings of bloating in the abdomen. Passage of more gas than usual.  Walking can help get rid of the air that was put into your GI tract during the procedure and reduce the bloating. If you had a lower endoscopy (such as a colonoscopy or flexible sigmoidoscopy) you may notice spotting of blood in your stool or on the toilet paper. If you underwent a bowel prep for your procedure, then you may not have a normal bowel movement for a few days.  DIET: Your first meal following the procedure should be a light meal and then it is ok to progress to your normal diet.  A half-sandwich or bowl of soup is an example of a good first meal.  Heavy or fried foods are harder to digest and may make you feel nauseous or bloated.  Likewise meals heavy in dairy and vegetables can cause extra gas to form and this can also increase the bloating.  Drink plenty of fluids but you should avoid alcoholic beverages for 24 hours.  ACTIVITY: Your care partner should take you home directly after the procedure.  You should plan  to take it easy, moving slowly for the rest of the day.  You can resume normal activity the day after the procedure however you should NOT DRIVE or use heavy machinery for 24 hours (because of the sedation medicines used during the test).    SYMPTOMS TO REPORT IMMEDIATELY: A gastroenterologist can be reached at any hour.  During normal business hours, 8:30 AM to 5:00 PM Monday through Friday, call [phone].  After hours and on weekends, please call the GI answering service at [phone]  Emergency number who will take a message and have the physician on call contact you.   Following lower endoscopy (colonoscopy or flexible sigmoidoscopy):  Excessive amounts of blood in the stool  Significant tenderness or worsening of abdominal pains  Swelling of the abdomen that is new, acute  Fever of 100F or higher  FOLLOW UP: If any biopsies were taken you will be contacted by phone or by letter within the next 1-3 weeks.  Call your gastroenterologist if you have not heard about the biopsies in 3 weeks.  Our staff will call the home number listed on your records the next business day following your procedure to check on you and address any questions or concerns that you may have at that time regarding the information given to you following your procedure. This is a courtesy call and so if there is no answer at the home number and we have not heard from  you through the emergency physician on call, we will assume that you have returned to your regular daily activities without incident.  SIGNATURES/CONFIDENTIALITY: You and/or your care partner have signed paperwork which will be entered into your electronic medical record.  These signatures attest to the fact that that the information above on your After Visit Summary has been reviewed and is understood.  Full responsibility of the confidentiality of this discharge information lies with you and/or your care-partner.

## 2013-07-08 NOTE — Op Note (Signed)
[facility] [address] [place], [address]   COLONOSCOPY PROCEDURE REPORT  PATIENT: Danielle Pierce, Danielle Pierce  MR#: [id number] BIRTHDATE: [birth year] , 71  yrs. old GENDER: Female ENDOSCOPIST: Iva Boop, MD, [facility] PROCEDURE DATE:  07/08/2013 PROCEDURE:   Colonoscopy, surveillance First Screening Colonoscopy - Avg.  risk and is 50 yrs.  old or older - No.  Prior Negative Screening - Now for repeat screening. N/A  History of Adenoma - Now for follow-up colonoscopy & has been > or = to 3 yrs.  Yes hx of adenoma.  Has been 3 or more years since last colonoscopy.  Polyps Removed Today? No.  Recommend repeat exam, <10 yrs? Yes.  High risk (family or personal hx). ASA CLASS:   Class II INDICATIONS:Patient's personal history of adenomatous colon polyps and Patient's immediate family history of colon cancer. MEDICATIONS: propofol (Diprivan) 150mg  IV, MAC sedation, administered by CRNA, and These medications were titrated to patient response per physician's verbal order  DESCRIPTION OF PROCEDURE:   After the risks benefits and alternatives of the procedure were thoroughly explained, informed consent was obtained.  A digital rectal exam revealed no abnormalities of the rectum.   The LB [id number]  endoscope was introduced through the anus and advanced to the cecum, which was identified by both the appendix and ileocecal valve. No adverse events experienced.   The quality of the prep was excellent using Suprep  The instrument was then slowly withdrawn as the colon was fully examined.    COLON FINDINGS: Moderate diverticulosis was noted in the sigmoid colon.   The colon mucosa was otherwise normal.   A right colon retroflexion was performed.  Retroflexed views revealed internal/external hemorrhoids. The time to cecum=1 minutes 05 seconds.  Withdrawal time=6 minutes 0 seconds.  The scope was withdrawn and the procedure completed. COMPLICATIONS: There were no  complications.  ENDOSCOPIC IMPRESSION: 1.   Moderate diverticulosis was noted in the sigmoid colon 2.   The colon mucosa was otherwise normal - excellent prep (hx small adenomas 2003 and 2009) 3.   Internal hemorrhoids 4.   External hemorrhoids  RECOMMENDATIONS: Repeat Colonoscopy in 5 years - 2019 If hemorrhoids are causing problems banding is an option.  eSigned:  Iva Boop, MD, [facility] 07/08/2013 10:37 AM  cc: The Patient and Murray Hodgkins, MD

## 2013-07-09 ENCOUNTER — Telehealth: Payer: Self-pay | Admitting: *Deleted

## 2013-07-09 NOTE — Telephone Encounter (Signed)
  Follow up Call-  Call back number 07/08/2013  Post procedure Call Back phone  # [phone]  Permission to leave phone message Yes     Patient questions:  Do you have a fever, pain , or abdominal swelling? no Pain Score  0 *  Have you tolerated food without any problems? yes  Have you been able to return to your normal activities? yes  Do you have any questions about your discharge instructions: Diet   no Medications  no Follow up visit  no  Do you have questions or concerns about your Care? no  Actions: * If pain score is 4 or above: No action needed, pain <4.

## 2013-07-18 ENCOUNTER — Other Ambulatory Visit: Payer: Self-pay | Admitting: Internal Medicine

## 2013-07-24 ENCOUNTER — Other Ambulatory Visit: Payer: Medicare Other

## 2013-07-24 DIAGNOSIS — E119 Type 2 diabetes mellitus without complications: Secondary | ICD-10-CM

## 2013-07-25 ENCOUNTER — Other Ambulatory Visit: Payer: Medicare Other

## 2013-07-25 LAB — HEMOGLOBIN A1C
Est. average glucose Bld gHb Est-mCnc: 140 mg/dL
Hgb A1c MFr Bld: 6.5 % — ABNORMAL HIGH (ref 4.8–5.6)

## 2013-07-25 LAB — BASIC METABOLIC PANEL
BUN/Creatinine Ratio: 13 (ref 11–26)
BUN: 14 mg/dL (ref 8–27)
CO2: 24 mmol/L (ref 18–29)
Calcium: 9.7 mg/dL (ref 8.6–10.2)
Chloride: 103 mmol/L (ref 97–108)
GFR calc Af Amer: 62 mL/min/{1.73_m2} (ref >59)
Potassium: 4 mmol/L (ref 3.5–5.2)
Sodium: 143 mmol/L (ref 134–144)

## 2013-07-29 ENCOUNTER — Encounter: Payer: Self-pay | Admitting: Internal Medicine

## 2013-07-29 ENCOUNTER — Ambulatory Visit (INDEPENDENT_AMBULATORY_CARE_PROVIDER_SITE_OTHER): Payer: Medicare Other | Admitting: Internal Medicine

## 2013-07-29 VITALS — BP 140/88 | HR 49 | Temp 96.8°F | Wt 236.6 lb

## 2013-07-29 DIAGNOSIS — K644 Residual hemorrhoidal skin tags: Secondary | ICD-10-CM

## 2013-07-29 DIAGNOSIS — E119 Type 2 diabetes mellitus without complications: Secondary | ICD-10-CM

## 2013-07-29 DIAGNOSIS — K648 Other hemorrhoids: Secondary | ICD-10-CM

## 2013-07-29 DIAGNOSIS — Z23 Encounter for immunization: Secondary | ICD-10-CM

## 2013-07-29 DIAGNOSIS — E669 Obesity, unspecified: Secondary | ICD-10-CM

## 2013-07-29 DIAGNOSIS — I10 Essential (primary) hypertension: Secondary | ICD-10-CM

## 2013-07-29 MED ORDER — LOSARTAN POTASSIUM-HCTZ 50-12.5 MG PO TABS: ORAL_TABLET | ORAL | Status: DC

## 2013-07-29 MED ORDER — TETANUS-DIPHTH-ACELL PERTUSSIS 5-2.5-18.5 LF-MCG/0.5 IM SUSP: 0.5000 mL | Freq: Once | INTRAMUSCULAR | Status: DC

## 2013-07-29 MED ORDER — IBUPROFEN 800 MG PO TABS: ORAL_TABLET | ORAL | Status: DC

## 2013-07-29 NOTE — Patient Instructions (Signed)
Continue current medications. 

## 2013-07-29 NOTE — Progress Notes (Signed)
Subjective:    Patient ID: Danielle Pierce, female    DOB: 22-Jun-1941, 72 y.o.   MRN: [id number]  Chief Complaint  Patient presents with  . Medical Managment of Chronic Issues    3-4 month follow-up, discuss labs (copy printed)     HPI Obesity, unspecified: having difficulty maintaining weight loss  Type II or unspecified type diabetes mellitus without mention of complication: controlled  Unspecified essential hypertension: controlled  Internal and external bleeding hemorrhoids: occ. Bleeding. She says Dr. Herby Abraham told her he could fix them. Painless.  Past Surgical History  Procedure Laterality Date  . Mouth surgery  2005    DR LUTINS  . Flexible sigmoidoscopy  1990    Hemorrhoids   . Colonoscopy  2006    Dr.Orr  . Colonoscopy  07/08/2013    Dr. Leone Payor     Current Outpatient Prescriptions on File Prior to Visit  Medication Sig Dispense Refill  . aspirin 81 MG tablet Take 81 mg by mouth daily. Take 1 tablet once a day.      . Calcium Carbonate-Vitamin D (CALCIUM-VITAMIN D) 500-200 MG-UNIT per tablet Take 1 tablet by mouth 2 (two) times daily. Take 1 tablet twice a day to help bones.      . Carboxymethylcell-Hypromellose (GENTEAL) 0.25-0.3 % GEL Apply 1 application to eye daily. Use 1 application to each eye as needed to alleviate irritation.      . Cholecalciferol (VITAMIN D-3 PO) Take 1 tablet by mouth daily.       . hydrocortisone (ANUSOL-HC) 25 MG suppository Place 25 mg rectally 2 (two) times daily as needed for hemorrhoids.      Marland Kitchen ibuprofen (ADVIL,MOTRIN) 800 MG tablet TAKE 1 TABLET UP TO 4 TIMES A DAY AS NEEDED FOR PAIN.  120 tablet  5  . losartan-hydrochlorothiazide (HYZAAR) 50-12.5 MG per tablet TAKE 1 TABLET DAILY FOR BLOOD PRESSURE.  90 tablet  0  . vitamin B-12 (CYANOCOBALAMIN) 1000 MCG tablet Take 1,000 mcg by mouth daily. Take 1 tablet once a day.      . vitamin C (ASCORBIC ACID) 500 MG tablet Take 500 mg by mouth daily. Take 1 tablet once a day.      .  vitamin E 400 UNIT capsule Take 400 Units by mouth daily. Take 1 capsule once a day.       No current facility-administered medications on file prior to visit.    Review of Systems  Constitutional: Negative.  Negative for activity change, appetite change and unexpected weight change.  HENT: Negative.   Eyes: Negative.   Respiratory: Negative.   Cardiovascular: Negative for chest pain, palpitations and leg swelling.  Gastrointestinal: Positive for blood in stool. Negative for nausea, abdominal pain, diarrhea, abdominal distention and rectal pain.       Seen in this office 03/07/13 by Dr. Bufford Spikes. Hematochezia 03/07/13. No abdominal pain. Mild discomfort in lower back. Colonoscopy done October 2014 due to a history of adenomatous polyp and family history. She is seeing Dr. Leone Payor.  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 Still some discomfort, but no swelling.  Skin: Negative.   Hematological: Negative.   Psychiatric/Behavioral: Negative.        Objective:BP 140/88  Pulse 49  Temp(Src) 96.8 F (36 C)  Wt 236 lb 9.6 oz (107.321 kg)  BMI 40 kg/m2  SpO2 96%    Physical Exam  Constitutional:  She is oriented to person, place, and time.  Obese.  HENT:  Head: Normocephalic and atraumatic.  Right Ear: External ear normal.  Left Ear: External ear normal.  Nose: Nose normal.  Mouth/Throat: Oropharynx is clear and moist.  Eyes: Conjunctivae and EOM are normal. Pupils are equal, round, and reactive to light.  Neck: No JVD present. No tracheal deviation present. No thyromegaly present.  Cardiovascular: Normal rate, regular rhythm, normal heart sounds and intact distal pulses.   Pulmonary/Chest: No respiratory distress. She has no wheezes. She has no rales. She exhibits no tenderness.  Abdominal: Bowel sounds are normal. She exhibits no distension and no mass. There is no tenderness.  Musculoskeletal: Normal  range of motion. She exhibits no edema and no tenderness.  Lymphadenopathy:    She has no cervical adenopathy.  Neurological: She is alert and oriented to person, place, and time. She has normal reflexes. No cranial nerve deficit.  Intact vibratory sensation.  Skin: No rash noted. No erythema. No pallor.  Psychiatric: She has a normal mood and affect. Her behavior is normal. Judgment and thought content normal.     Office Visit on 07/29/2013  Component Date Value Range Status  . HM Mammogram 03/12/2012 Neg   Final  Appointment on 07/24/2013  Component Date Value Range Status  . Hemoglobin A1C 07/24/2013 6.5* 4.8 - 5.6 % Final   Comment:          Increased risk for diabetes: 5.7 - 6.4                                   Diabetes: >6.4                                   Glycemic control for adults with diabetes: <7.0  . Estimated average glucose 07/24/2013 140   Final  . Glucose 07/24/2013 109* 65 - 99 mg/dL Final  . BUN 16/07/9603 14  8 - 27 mg/dL Final  . Creatinine, Ser 07/24/2013 1.04* 0.57 - 1.00 mg/dL Final  . GFR calc non Af Amer 07/24/2013 54* >59 mL/min/1.73 Final  . GFR calc Af Amer 07/24/2013 62  >59 mL/min/1.73 Final  . BUN/Creatinine Ratio 07/24/2013 13  11 - 26 Final  . Sodium 07/24/2013 143  134 - 144 mmol/L Final  . Potassium 07/24/2013 4.0  3.5 - 5.2 mmol/L Final  . Chloride 07/24/2013 103  97 - 108 mmol/L Final  . CO2 07/24/2013 24  18 - 29 mmol/L Final  . Calcium 07/24/2013 9.7  8.6 - 10.2 mg/dL Final         Assessment & Plan:  Obesity, unspecified; no change in weight in the last 6 mo.  Type II or unspecified type diabetes mellitus without mention of complication: controlled  Unspecified essential hypertension: controlled  Internal and external bleeding hemorrhoids: rec. she call Dr. Leone Payor to fix hemorrhoids

## 2013-08-15 ENCOUNTER — Encounter: Payer: Self-pay | Admitting: Internal Medicine

## 2013-08-15 ENCOUNTER — Encounter: Payer: Self-pay | Admitting: *Deleted

## 2013-11-19 ENCOUNTER — Other Ambulatory Visit: Payer: Self-pay | Admitting: Internal Medicine

## 2013-12-01 ENCOUNTER — Other Ambulatory Visit: Payer: Medicare Other

## 2013-12-01 DIAGNOSIS — I10 Essential (primary) hypertension: Secondary | ICD-10-CM

## 2013-12-01 DIAGNOSIS — E119 Type 2 diabetes mellitus without complications: Secondary | ICD-10-CM

## 2013-12-02 LAB — COMPREHENSIVE METABOLIC PANEL
ALT: 16 IU/L (ref 0–32)
AST: 26 IU/L (ref 0–40)
Albumin/Globulin Ratio: 2.7 — ABNORMAL HIGH (ref 1.1–2.5)
Albumin: 4.3 g/dL (ref 3.5–4.8)
Alkaline Phosphatase: 74 IU/L (ref 39–117)
BUN/Creatinine Ratio: 17 (ref 11–26)
BUN: 17 mg/dL (ref 8–27)
CO2: 22 mmol/L (ref 18–29)
Calcium: 9.1 mg/dL (ref 8.7–10.3)
Chloride: 107 mmol/L (ref 97–108)
Creatinine, Ser: 0.98 mg/dL (ref 0.57–1.00)
GFR calc Af Amer: 67 mL/min/{1.73_m2} (ref >59)
GFR calc non Af Amer: 58 mL/min/{1.73_m2} — ABNORMAL LOW (ref >59)
Globulin, Total: 1.6 g/dL (ref 1.5–4.5)
Glucose: 101 mg/dL — ABNORMAL HIGH (ref 65–99)
POTASSIUM: 4.2 mmol/L (ref 3.5–5.2)
SODIUM: 146 mmol/L — AB (ref 134–144)
Total Bilirubin: 0.4 mg/dL (ref 0.0–1.2)
Total Protein: 5.9 g/dL — ABNORMAL LOW (ref 6.0–8.5)

## 2013-12-02 LAB — HEMOGLOBIN A1C
ESTIMATED AVERAGE GLUCOSE: 140 mg/dL
HEMOGLOBIN A1C: 6.5 % — AB (ref 4.8–5.6)

## 2013-12-03 ENCOUNTER — Ambulatory Visit (INDEPENDENT_AMBULATORY_CARE_PROVIDER_SITE_OTHER): Payer: Medicare Other | Admitting: Internal Medicine

## 2013-12-03 ENCOUNTER — Encounter: Payer: Self-pay | Admitting: Internal Medicine

## 2013-12-03 VITALS — BP 150/86 | HR 72 | Resp 14 | Wt 236.0 lb

## 2013-12-03 DIAGNOSIS — K648 Other hemorrhoids: Secondary | ICD-10-CM

## 2013-12-03 DIAGNOSIS — E669 Obesity, unspecified: Secondary | ICD-10-CM

## 2013-12-03 DIAGNOSIS — K644 Residual hemorrhoidal skin tags: Secondary | ICD-10-CM

## 2013-12-03 DIAGNOSIS — E119 Type 2 diabetes mellitus without complications: Secondary | ICD-10-CM

## 2013-12-03 DIAGNOSIS — I10 Essential (primary) hypertension: Secondary | ICD-10-CM

## 2013-12-03 DIAGNOSIS — Z23 Encounter for immunization: Secondary | ICD-10-CM

## 2013-12-03 MED ORDER — TETANUS-DIPHTH-ACELL PERTUSSIS 5-2.5-18.5 LF-MCG/0.5 IM SUSP: 0.5000 mL | Freq: Once | INTRAMUSCULAR | Status: DC

## 2013-12-03 MED ORDER — HYDROCORTISONE ACETATE 25 MG RE SUPP: RECTAL | Status: DC

## 2013-12-03 NOTE — Patient Instructions (Signed)
Continue medications as listed 

## 2013-12-03 NOTE — Progress Notes (Signed)
Patient ID: Danielle Pierce, female   DOB: [birth year], 73 y.o.   MRN: [id number]    Location:    [facility]  Place of Service:  OFFICE   No Known Allergies  Chief Complaint  Patient presents with  . Medical Managment of Chronic Issues    4 month f/u  & discuss labs. (printed)  . other    watery/itchy eyes x 1 month   . Immunizations    discuss shingles vaccine and print Tdap vaccine    HPI:   Unspecified essential hypertension  Type II or unspecified type diabetes mellitus without mention of complication, not stated as uncontrolled  Obesity, unspecified  Internal and external bleeding hemorrhoids - Plan: hydrocortisone (ANUCORT-HC) 25 MG suppository  Immunization due - Plan: Tdap (BOOSTRIX) 5-2.5-18.5 LF-MCG/0.5 injection  Need for prophylactic vaccination with combined diphtheria-tetanus-pertussis (DTP) vaccine - Plan: Tdap (BOOSTRIX) 5-2.5-18.5 LF-MCG/0.5 injection    Medications: Patient's Medications  New Prescriptions   No medications on file  Previous Medications   ASPIRIN 81 MG TABLET    Take 81 mg by mouth daily. Take 1 tablet once a day.   CALCIUM CARBONATE-VITAMIN D (CALCIUM-VITAMIN D) 500-200 MG-UNIT PER TABLET    Take 1 tablet by mouth 2 (two) times daily. Take 1 tablet twice a day to help bones.   CARBOXYMETHYLCELL-HYPROMELLOSE (GENTEAL) 0.25-0.3 % GEL    Apply 1 application to eye daily. Use 1 application to each eye as needed to alleviate irritation.   CHOLECALCIFEROL (VITAMIN D-3 PO)    Take 1 tablet by mouth daily.    HYDROCORTISONE (ANUSOL-HC) 25 MG SUPPOSITORY    Place 25 mg rectally 2 (two) times daily as needed for hemorrhoids.   IBUPROFEN (ADVIL,MOTRIN) 800 MG TABLET    TAKE 1 TABLET UP TO 4 TIMES A DAY AS NEEDED FOR PAIN.   LOSARTAN-HYDROCHLOROTHIAZIDE (HYZAAR) 50-12.5 MG PER TABLET    TAKE 1 TABLET DAILY FOR BLOOD PRESSURE.   VITAMIN B-12 (CYANOCOBALAMIN) 1000 MCG TABLET    Take 1,000 mcg by mouth daily. Take 1 tablet once a day.   VITAMIN C  (ASCORBIC ACID) 500 MG TABLET    Take 500 mg by mouth daily. Take 1 tablet once a day.   VITAMIN E 400 UNIT CAPSULE    Take 400 Units by mouth daily. Take 1 capsule once a day.  Modified Medications   Modified Medication Previous Medication   HYDROCORTISONE (ANUCORT-HC) 25 MG SUPPOSITORY ANUCORT-HC 25 MG suppository      Insert 1 suppository rectally once daily for Hemorrhoids.    INSERT 1 SUPPOSITORY RECTALLY TWICE A DAY FOR HEMORRHOIDS.   TDAP (BOOSTRIX) 5-2.5-18.5 LF-MCG/0.5 INJECTION TDaP (BOOSTRIX) 5-2.5-18.5 LF-MCG/0.5 injection      Inject 0.5 mLs into the muscle once.    Inject 0.5 mLs into the muscle once.  Discontinued Medications   No medications on file     Review of Systems  Constitutional: Negative.  Negative for activity change, appetite change and unexpected weight change.  HENT: Negative.   Eyes: Negative.   Respiratory: Negative.   Cardiovascular: Negative for chest pain, palpitations and leg swelling.  Gastrointestinal: Positive for blood in stool. Negative for nausea, abdominal pain, diarrhea, abdominal distention and rectal pain.       Seen in this office 03/07/13 by Dr. Hollace Kinnier. Hematochezia 03/07/13. No abdominal pain. Mild discomfort in lower back. Colonoscopy done October 2014 due to a history of adenomatous polyp and family history. She is seeing Dr. Carlean Purl.  Skin: Negative.   Hematological: Negative.  Psychiatric/Behavioral: Negative.     Filed Vitals:   12/03/13 1555  BP: 150/86  Pulse: 72  Resp: 14  Weight: 236 lb (107.049 kg)   Physical Exam  Constitutional: She is oriented to person, place, and time.  Obese.  HENT:  Head: Normocephalic and atraumatic.  Right Ear: External ear normal.  Left Ear: External ear normal.  Nose: Nose normal.  Mouth/Throat: Oropharynx is clear and moist.  Eyes: Conjunctivae and EOM are normal. Pupils are equal, round, and reactive to light.  Neck: No JVD present. No tracheal deviation present. No thyromegaly  present.  Cardiovascular: Normal rate, regular rhythm, normal heart sounds and intact distal pulses.   Pulmonary/Chest: No respiratory distress. She has no wheezes. She has no rales. She exhibits no tenderness.  Abdominal: Bowel sounds are normal. She exhibits no distension and no mass. There is no tenderness.  Musculoskeletal: Normal range of motion. She exhibits no edema and no tenderness.  Lymphadenopathy:    She has no cervical adenopathy.  Neurological: She is alert and oriented to person, place, and time. She has normal reflexes. No cranial nerve deficit.  Intact vibratory sensation.  Skin: No rash noted. No erythema. No pallor.  Psychiatric: She has a normal mood and affect. Her behavior is normal. Judgment and thought content normal.     Labs reviewed: Appointment on 12/01/2013  Component Date Value Ref Range Status  . Hemoglobin A1C 12/01/2013 6.5* 4.8 - 5.6 % Final   Comment:          Increased risk for diabetes: 5.7 - 6.4                                   Diabetes: >6.4                                   Glycemic control for adults with diabetes: <7.0  . Estimated average glucose 12/01/2013 140   Final  . Glucose 12/01/2013 101* 65 - 99 mg/dL Final  . BUN 12/01/2013 17  8 - 27 mg/dL Final  . Creatinine, Ser 12/01/2013 0.98  0.57 - 1.00 mg/dL Final  . GFR calc non Af Amer 12/01/2013 58* >59 mL/min/1.73 Final  . GFR calc Af Amer 12/01/2013 67  >59 mL/min/1.73 Final  . BUN/Creatinine Ratio 12/01/2013 17  11 - 26 Final  . Sodium 12/01/2013 146* 134 - 144 mmol/L Final  . Potassium 12/01/2013 4.2  3.5 - 5.2 mmol/L Final  . Chloride 12/01/2013 107  97 - 108 mmol/L Final  . CO2 12/01/2013 22  18 - 29 mmol/L Final  . Calcium 12/01/2013 9.1  8.7 - 10.3 mg/dL Final  . Total Protein 12/01/2013 5.9* 6.0 - 8.5 g/dL Final  . Albumin 12/01/2013 4.3  3.5 - 4.8 g/dL Final  . Globulin, Total 12/01/2013 1.6  1.5 - 4.5 g/dL Final  . Albumin/Globulin Ratio 12/01/2013 2.7* 1.1 - 2.5 Final  .  Total Bilirubin 12/01/2013 0.4  0.0 - 1.2 mg/dL Final  . Alkaline Phosphatase 12/01/2013 74  39 - 117 IU/L Final  . AST 12/01/2013 26  0 - 40 IU/L Final  . ALT 12/01/2013 16  0 - 32 IU/L Final      Assessment/Plan  1. Type II or unspecified type diabetes mellitus without mention of complication, not stated as uncontrolled - Hemoglobin A1c; Future - Basic metabolic panel;  Future - Microalbumin / creatinine urine ratio; Future  2. Unspecified essential hypertension - Basic metabolic panel; Future  3. Obesity, unspecified Continue to try to lose weight  4. Internal and external bleeding hemorrhoids - hydrocortisone (ANUCORT-HC) 25 MG suppository; Insert 1 suppository rectally once daily for Hemorrhoids.  Dispense: 48 suppository; Refill: 0  5. Immunization due - Tdap (BOOSTRIX) 5-2.5-18.5 LF-MCG/0.5 injection; Inject 0.5 mLs into the muscle once.  Dispense: 0.5 mL; Refill: 0  6. Need for prophylactic vaccination with combined diphtheria-tetanus-pertussis (DTP) vaccine - Tdap (BOOSTRIX) 5-2.5-18.5 LF-MCG/0.5 injection; Inject 0.5 mLs into the muscle once.  Dispense: 0.5 mL; Refill: 0

## 2013-12-31 ENCOUNTER — Ambulatory Visit: Payer: Medicare Other | Admitting: Internal Medicine

## 2014-03-03 LAB — HM MAMMOGRAPHY: HM MAMMO: NORMAL

## 2014-04-06 ENCOUNTER — Other Ambulatory Visit: Payer: Medicare Other

## 2014-04-06 DIAGNOSIS — I10 Essential (primary) hypertension: Secondary | ICD-10-CM

## 2014-04-06 DIAGNOSIS — E119 Type 2 diabetes mellitus without complications: Secondary | ICD-10-CM

## 2014-04-07 LAB — BASIC METABOLIC PANEL
BUN/Creatinine Ratio: 16 (ref 11–26)
BUN: 16 mg/dL (ref 8–27)
CALCIUM: 9.1 mg/dL (ref 8.7–10.3)
CO2: 23 mmol/L (ref 18–29)
Chloride: 103 mmol/L (ref 97–108)
Creatinine, Ser: 1.02 mg/dL — ABNORMAL HIGH (ref 0.57–1.00)
GFR calc non Af Amer: 55 mL/min/{1.73_m2} — ABNORMAL LOW (ref >59)
GFR, EST AFRICAN AMERICAN: 64 mL/min/{1.73_m2} (ref >59)
Glucose: 98 mg/dL (ref 65–99)
POTASSIUM: 3.6 mmol/L (ref 3.5–5.2)
SODIUM: 144 mmol/L (ref 134–144)

## 2014-04-07 LAB — HEMOGLOBIN A1C
ESTIMATED AVERAGE GLUCOSE: 137 mg/dL
Hgb A1c MFr Bld: 6.4 % — ABNORMAL HIGH (ref 4.8–5.6)

## 2014-04-08 ENCOUNTER — Encounter: Payer: Self-pay | Admitting: Internal Medicine

## 2014-04-08 ENCOUNTER — Ambulatory Visit (INDEPENDENT_AMBULATORY_CARE_PROVIDER_SITE_OTHER): Payer: Medicare Other | Admitting: Internal Medicine

## 2014-04-08 VITALS — BP 132/80 | HR 64 | Temp 97.4°F | Resp 10 | Wt 233.0 lb

## 2014-04-08 DIAGNOSIS — L819 Disorder of pigmentation, unspecified: Secondary | ICD-10-CM

## 2014-04-08 DIAGNOSIS — E669 Obesity, unspecified: Secondary | ICD-10-CM

## 2014-04-08 DIAGNOSIS — M545 Low back pain, unspecified: Secondary | ICD-10-CM

## 2014-04-08 DIAGNOSIS — E119 Type 2 diabetes mellitus without complications: Secondary | ICD-10-CM

## 2014-04-08 DIAGNOSIS — M79609 Pain in unspecified limb: Secondary | ICD-10-CM

## 2014-04-08 DIAGNOSIS — I10 Essential (primary) hypertension: Secondary | ICD-10-CM

## 2014-04-08 DIAGNOSIS — H0233 Blepharochalasis right eye, unspecified eyelid: Secondary | ICD-10-CM | POA: Insufficient documentation

## 2014-04-08 DIAGNOSIS — Z23 Encounter for immunization: Secondary | ICD-10-CM

## 2014-04-08 DIAGNOSIS — M79602 Pain in left arm: Secondary | ICD-10-CM

## 2014-04-08 DIAGNOSIS — L814 Other melanin hyperpigmentation: Secondary | ICD-10-CM | POA: Insufficient documentation

## 2014-04-08 DIAGNOSIS — H0230 Blepharochalasis unspecified eye, unspecified eyelid: Secondary | ICD-10-CM

## 2014-04-08 MED ORDER — TETANUS-DIPHTH-ACELL PERTUSSIS 5-2.5-18.5 LF-MCG/0.5 IM SUSP: 0.5000 mL | Freq: Once | INTRAMUSCULAR | Status: DC

## 2014-04-08 NOTE — Progress Notes (Signed)
Patient ID: Danielle Pierce, female   DOB: 1941/07/15, 73 y.o.   MRN: [id number]    Location:    [facility]  Place of Service:  OFFICE    No Known Allergies  Chief Complaint  Patient presents with  . Medical Management of Chronic Issues    4 month follow-up, discuss labs (copy printed)   . Arm Pain    Left arm pain- ongoing concern (see ast OV)   . Skin Problem    Patient c/o spots all over- ? referral to dermatologist     HPI:  Immunization due - Plan: Tdap (BOOSTRIX) 5-2.5-18.5 LF-MCG/0.5 injection  Need for prophylactic vaccination with combined diphtheria-tetanus-pertussis (DTP) vaccine - Plan: Tdap (BOOSTRIX) 5-2.5-18.5 LF-MCG/0.5 injection  Left arm pain - mid left arm. No knots or mass. No deformity. Hand movement is normal  Age spots - some areas of discoloration, but most of heer concern is directed to scaly lesions that look like seborrheic keratoses to me.  Type II or unspecified type diabetes mellitus without mention of complication, not stated as uncontrolled - controlled  Obesity, unspecified: continues to work on her diet. She lost 3# since last visit with me.  Unspecified essential hypertension - controlled  Blepharochalasis of right eye: she asks what causes a droopy eyelid and says her right upper lid seems to droop. Denies vision impairment or pain in the eye. Present for more than a year  Midline low back pain without sciatica: present for more than a month. She finds most troublesome when working a 4 hour shift at [employer].    Medications: Patient's Medications  New Prescriptions   No medications on file  Previous Medications   ASPIRIN 81 MG TABLET    Take 81 mg by mouth daily. Take 1 tablet once a day.   CALCIUM CARBONATE-VITAMIN D (CALCIUM-VITAMIN D) 500-200 MG-UNIT PER TABLET    Take 1 tablet by mouth 2 (two) times daily. Take 1 tablet twice a day to help bones.   CARBOXYMETHYLCELL-HYPROMELLOSE (GENTEAL) 0.25-0.3 % GEL    Apply 1 application to  eye daily. Use 1 application to each eye as needed to alleviate irritation.   CHOLECALCIFEROL (VITAMIN D-3 PO)    Take 1 tablet by mouth daily.    HYDROCORTISONE (ANUCORT-HC) 25 MG SUPPOSITORY    Insert 1 suppository rectally once daily for Hemorrhoids.   IBUPROFEN (ADVIL,MOTRIN) 800 MG TABLET    TAKE 1 TABLET UP TO 4 TIMES A DAY AS NEEDED FOR PAIN.   LOSARTAN-HYDROCHLOROTHIAZIDE (HYZAAR) 50-12.5 MG PER TABLET    TAKE 1 TABLET DAILY FOR BLOOD PRESSURE.   VITAMIN B-12 (CYANOCOBALAMIN) 1000 MCG TABLET    Take 1,000 mcg by mouth daily. Take 1 tablet once a day.   VITAMIN C (ASCORBIC ACID) 500 MG TABLET    Take 500 mg by mouth daily. Take 1 tablet once a day.   VITAMIN E 400 UNIT CAPSULE    Take 400 Units by mouth daily. Take 1 capsule once a day.  Modified Medications   Modified Medication Previous Medication   TDAP (BOOSTRIX) 5-2.5-18.5 LF-MCG/0.5 INJECTION Tdap (BOOSTRIX) 5-2.5-18.5 LF-MCG/0.5 injection      Inject 0.5 mLs into the muscle once.    Inject 0.5 mLs into the muscle once.  Discontinued Medications   HYDROCORTISONE (ANUSOL-HC) 25 MG SUPPOSITORY    Place 25 mg rectally 2 (two) times daily as needed for hemorrhoids.     Review of Systems  Constitutional: Negative.  Negative for activity change, appetite change and unexpected  weight change.  HENT: Negative.   Eyes: Negative.   Respiratory: Negative.   Cardiovascular: Negative for chest pain, palpitations and leg swelling.  Gastrointestinal: Positive for blood in stool. Negative for nausea, abdominal pain, diarrhea, abdominal distention and rectal pain.        Hematochezia 03/07/13. No abdominal pain. Mild discomfort in lower back. Colonoscopy done October 2014 due to a history of adenomatous polyp and family history. Sees Dr. Carlean Purl.  Skin: Negative.   Hematological: Negative.   Psychiatric/Behavioral: Negative.     Filed Vitals:   04/08/14 1403  BP: 132/80  Pulse: 64  Temp: 97.4 F (36.3 C)  TempSrc: Oral  Resp: 10    Weight: 233 lb (105.688 kg)   Body mass index is 39.39 kg/(m^2).  Physical Exam  Constitutional: She is oriented to person, place, and time.  Obese.  HENT:  Head: Normocephalic and atraumatic.  Right Ear: External ear normal.  Left Ear: External ear normal.  Nose: Nose normal.  Mouth/Throat: Oropharynx is clear and moist.  Eyes: Conjunctivae and EOM are normal. Pupils are equal, round, and reactive to light.  Neck: No JVD present. No tracheal deviation present. No thyromegaly present.  Cardiovascular: Normal rate, regular rhythm, normal heart sounds and intact distal pulses.   Pulmonary/Chest: No respiratory distress. She has no wheezes. She has no rales. She exhibits no tenderness.  Abdominal: Bowel sounds are normal. She exhibits no distension and no mass. There is no tenderness.  Musculoskeletal: Normal range of motion. She exhibits no edema and no tenderness.  Discomfort left mid arm on palpation. No focal spots of pain in back or pelvis on palpation  Lymphadenopathy:    She has no cervical adenopathy.  Neurological: She is alert and oriented to person, place, and time. She has normal reflexes. No cranial nerve deficit.  Intact vibratory sensation.  Skin: No rash noted. No erythema. No pallor.  Psychiatric: She has a normal mood and affect. Her behavior is normal. Judgment and thought content normal.     Labs reviewed: Appointment on 04/06/2014  Component Date Value Ref Range Status  . Hemoglobin A1C 04/06/2014 6.4* 4.8 - 5.6 % Final   Comment:          Increased risk for diabetes: 5.7 - 6.4                                   Diabetes: >6.4                                   Glycemic control for adults with diabetes: <7.0  . Estimated average glucose 04/06/2014 137   Final  . Glucose 04/06/2014 98  65 - 99 mg/dL Final  . BUN 04/06/2014 16  8 - 27 mg/dL Final  . Creatinine, Ser 04/06/2014 1.02* 0.57 - 1.00 mg/dL Final  . GFR calc non Af Amer 04/06/2014 55* >59 mL/min/1.73  Final  . GFR calc Af Amer 04/06/2014 64  >59 mL/min/1.73 Final  . BUN/Creatinine Ratio 04/06/2014 16  11 - 26 Final  . Sodium 04/06/2014 144  134 - 144 mmol/L Final  . Potassium 04/06/2014 3.6  3.5 - 5.2 mmol/L Final  . Chloride 04/06/2014 103  97 - 108 mmol/L Final  . CO2 04/06/2014 23  18 - 29 mmol/L Final  . Calcium 04/06/2014 9.1  8.7 - 10.3 mg/dL Final  Assessment/Plan  1. Immunization due - Tdap (BOOSTRIX) 5-2.5-18.5 LF-MCG/0.5 injection; Inject 0.5 mLs into the muscle once.  Dispense: 0.5 mL; Refill: 0  2. Need for prophylactic vaccination with combined diphtheria-tetanus-pertussis (DTP) vaccine - Tdap (BOOSTRIX) 5-2.5-18.5 LF-MCG/0.5 injection; Inject 0.5 mLs into the muscle once.  Dispense: 0.5 mL; Refill: 0  3. Left arm pain - Ambulatory referral to Orthopedic Surgery [facility])  4. Age spots - Ambulatory referral to Dermatology  5. Type II or unspecified type diabetes mellitus without mention of complication, not stated as uncontrolled - Comprehensive metabolic panel; Future - Hemoglobin A1c; Future - Microalbumin, urine; Future  6. Obesity, unspecified Continue diet and weight loss  7. Unspecified essential hypertension - Comprehensive metabolic panel; Future  8. Blepharochalasis of right eye See eye doctor. i am unable to see any significant droop of the right upper lid.  9. Midline low back pain without sciatica Use ibuprofen 800 mg prior to work and as neede. i do not think xrays are necessary at this time.

## 2014-04-08 NOTE — Patient Instructions (Signed)
Continue current medications. 

## 2014-04-09 ENCOUNTER — Encounter: Payer: Self-pay | Admitting: Internal Medicine

## 2014-04-09 LAB — MICROALBUMIN / CREATININE URINE RATIO
Creatinine, Ur: 220.4 mg/dL (ref 15.0–278.0)
MICROALB/CREAT RATIO: 5 mg/g creat (ref 0.0–30.0)
Microalbumin, Urine: 11 ug/mL (ref 0.0–17.0)

## 2014-04-10 ENCOUNTER — Other Ambulatory Visit: Payer: Self-pay | Admitting: Internal Medicine

## 2014-05-07 ENCOUNTER — Encounter (HOSPITAL_COMMUNITY): Payer: Self-pay | Admitting: Emergency Medicine

## 2014-05-07 ENCOUNTER — Emergency Department (HOSPITAL_COMMUNITY)
Admission: EM | Admit: 2014-05-07 | Discharge: 2014-05-07 | Disposition: A | Discharge status: Home / Self Care | Payer: Medicare Other | Attending: Emergency Medicine | Admitting: Emergency Medicine

## 2014-05-07 ENCOUNTER — Emergency Department (HOSPITAL_COMMUNITY): Payer: Medicare Other

## 2014-05-07 ENCOUNTER — Telehealth: Payer: Self-pay | Admitting: *Deleted

## 2014-05-07 ENCOUNTER — Emergency Department (INDEPENDENT_AMBULATORY_CARE_PROVIDER_SITE_OTHER)
Admission: EM | Admit: 2014-05-07 | Discharge: 2014-05-07 | Disposition: A | Discharge status: Nursing Home (Includes ICF) | Payer: Medicare Other | Source: Home / Self Care | Attending: Emergency Medicine | Admitting: Emergency Medicine

## 2014-05-07 DIAGNOSIS — E669 Obesity, unspecified: Secondary | ICD-10-CM | POA: Insufficient documentation

## 2014-05-07 DIAGNOSIS — Y92009 Unspecified place in unspecified non-institutional (private) residence as the place of occurrence of the external cause: Secondary | ICD-10-CM

## 2014-05-07 DIAGNOSIS — Y9389 Activity, other specified: Secondary | ICD-10-CM | POA: Diagnosis not present

## 2014-05-07 DIAGNOSIS — S0990XA Unspecified injury of head, initial encounter: Secondary | ICD-10-CM | POA: Insufficient documentation

## 2014-05-07 DIAGNOSIS — Z8719 Personal history of other diseases of the digestive system: Secondary | ICD-10-CM | POA: Diagnosis not present

## 2014-05-07 DIAGNOSIS — S40019A Contusion of unspecified shoulder, initial encounter: Secondary | ICD-10-CM | POA: Insufficient documentation

## 2014-05-07 DIAGNOSIS — Z7982 Long term (current) use of aspirin: Secondary | ICD-10-CM | POA: Diagnosis not present

## 2014-05-07 DIAGNOSIS — Y9289 Other specified places as the place of occurrence of the external cause: Secondary | ICD-10-CM | POA: Diagnosis not present

## 2014-05-07 DIAGNOSIS — W010XXA Fall on same level from slipping, tripping and stumbling without subsequent striking against object, initial encounter: Secondary | ICD-10-CM | POA: Insufficient documentation

## 2014-05-07 DIAGNOSIS — E559 Vitamin D deficiency, unspecified: Secondary | ICD-10-CM | POA: Insufficient documentation

## 2014-05-07 DIAGNOSIS — Z85038 Personal history of other malignant neoplasm of large intestine: Secondary | ICD-10-CM | POA: Diagnosis not present

## 2014-05-07 DIAGNOSIS — M129 Arthropathy, unspecified: Secondary | ICD-10-CM | POA: Diagnosis not present

## 2014-05-07 DIAGNOSIS — W102XXA Fall (on)(from) incline, initial encounter: Secondary | ICD-10-CM

## 2014-05-07 DIAGNOSIS — Z79899 Other long term (current) drug therapy: Secondary | ICD-10-CM | POA: Insufficient documentation

## 2014-05-07 DIAGNOSIS — Z8709 Personal history of other diseases of the respiratory system: Secondary | ICD-10-CM | POA: Insufficient documentation

## 2014-05-07 DIAGNOSIS — IMO0002 Reserved for concepts with insufficient information to code with codable children: Secondary | ICD-10-CM | POA: Insufficient documentation

## 2014-05-07 DIAGNOSIS — S40011A Contusion of right shoulder, initial encounter: Secondary | ICD-10-CM

## 2014-05-07 DIAGNOSIS — Z791 Long term (current) use of non-steroidal anti-inflammatories (NSAID): Secondary | ICD-10-CM | POA: Diagnosis not present

## 2014-05-07 DIAGNOSIS — I10 Essential (primary) hypertension: Secondary | ICD-10-CM | POA: Insufficient documentation

## 2014-05-07 DIAGNOSIS — W19XXXA Unspecified fall, initial encounter: Secondary | ICD-10-CM

## 2014-05-07 DIAGNOSIS — Z8742 Personal history of other diseases of the female genital tract: Secondary | ICD-10-CM | POA: Insufficient documentation

## 2014-05-07 DIAGNOSIS — S8001XA Contusion of right knee, initial encounter: Secondary | ICD-10-CM

## 2014-05-07 DIAGNOSIS — S8000XA Contusion of unspecified knee, initial encounter: Secondary | ICD-10-CM

## 2014-05-07 NOTE — ED Notes (Signed)
Pt  Felled  Last  Pm   Injuring  Her  r  Shoulder       And  Striking her  Head    - she   Did  Not  Black out  She  Remembers  All        She  Is  Alert  And oriented   rom is  present    she  Has  Not  Vomited   Her  Skin is  Warm  And  Dry

## 2014-05-07 NOTE — ED Provider Notes (Signed)
Chief Complaint   Chief Complaint  Patient presents with  . Fall    History of Present Illness   Danielle Pierce is a 73 year old female who tripped and fell going up some steps at home last night around midnight. She fell forward, striking her head on a linoleum floor. There was no loss of consciousness, but she did see stars. Today he she's got a mild headache and it feels tight like a band around her head. She also has mild pain in her right shoulder with full range of motion and pain in her right knee, she is ambulatory. She denies any blurry vision, diplopia, bleeding from the nose or ears, or neck pain. She has no pain in her chest, upper lower back, abdomen, hips, ankles, feet, elbows, wrists, or hands. She denies any paresthesias, localized muscle weakness, or difficulty with speech or ambulation.  Review of Systems   Other than as noted above, the patient denies any of the following symptoms: ENT:  No headache, facial pain, or bleeding from the nose or ears.  No loose or broken teeth. Neck:  No neck pain or stiffnes. Cardiac:  No chest pain. No palpitations, dizziness, syncope or fainting. GI:  No abdominal pain. No nausea, vomiting, or diarrhea. M-S:  No extremity pain, swelling, bruising, limited ROM, or back pain. Neuro:  No loss of consciousness, seizure activity, dizziness, vertigo, paresthesias, numbness, or weakness.  No difficulty with speech or ambulation.  [place]   Past medical history, family history, social history, meds, and allergies were reviewed.  She has diet-controlled diabetes and hypertension. She takes Hyzaar for blood pressure. No known medication allergies.  Physical Examination    Vital signs:  BP 174/94  Pulse 61  Temp(Src) 98.1 F (36.7 C) (Oral)  Resp 18  SpO2 97% General:  Alert, oriented and in no distress. Eye:  PERRL, full EOMs. ENT:  No cranial or facial tenderness to palpation. Neck:  No tenderness to palpation.  Full ROM without  pain. Heart:  Regular rhythm.  No extrasystoles, gallops, or murmers. Lungs:  No chest wall tenderness to palpation. Breath sounds clear and equal bilaterally.  No wheezes, rales or rhonchi. Abdomen:  Non tender. Back:  Non tender to palpation.  Full ROM without pain. Extremities:  She has mild tenderness to palpation over her entire right shoulder but full range of motion actively and passively with normal muscle strength and negative impingement signs. She also has mild tenderness to palpation over her right patella. The knee has a full range of motion with minimal pain.  Full ROM of all joints without pain.  Pulses full.  Brisk capillary refill. Neuro:  Alert and oriented times 3.  Cranial nerves intact.  No muscle weakness.  Sensation intact to light touch.  Gait normal. Skin:  No bruising, abrasions, or lacerations.  Assessment   The primary encounter diagnosis was Head injury, initial encounter. Diagnoses of Contusion of right shoulder, initial encounter, Contusion of right knee, initial encounter, Fall at home, initial encounter, and Place of occurrence, home were also pertinent to this visit.  Plan   The patient was transferred to the ED via shuttle in stable condition.  Medical Decision Making:  73 year old female tripped and [place].  She hit her head on a tile floor.  No LOC but she did see stars.  Today has slight headache.  No N or V or neuro symptoms.  Also has pain  in right shoulder and right knee.  Neuro exam is normal.  She is being sent due to significant head trauma and age.  Feel she needs a CT.       Harden Mo, MD 05/07/14 [id number]

## 2014-05-07 NOTE — Telephone Encounter (Signed)
Patient walked into office complaining that she fell last night and hit her head and shoulder. She states she has an appointment with her Orthopedic Dr. Marylene Buerger but wanted to see someone today for hitting her head. Checked our availability and no appointments. Patient stated that she wanted to be seen so she would just go over to the [facility] to be evaluated. Agreed.

## 2014-05-07 NOTE — Discharge Instructions (Signed)
We have determined that your problem requires further evaluation in the emergency department.  We will take care of your transport there.  Once at the emergency department, you will be evaluated by a provider and they will order whatever treatment or tests they deem necessary.  We cannot guarantee that they will do any specific test or do any specific treatment.  ° °

## 2014-05-07 NOTE — ED Notes (Signed)
Pt arrives via POV from [facility]. Pt was seen and treated for fall. Pt reports hitting her head with the fall. NEg LOC. Denies pain, headache, blurred vision or double vision. Sent over for CT due to pts age. Pt takes daily aspirin. Pt awake, alert, oriented x4,VSS, NAD at present.

## 2014-05-07 NOTE — Discharge Instructions (Signed)
Acromioclavicular Injuries °The AC (acromioclavicular) joint is the joint in the shoulder where the collarbone (clavicle) meets the shoulder blade (scapula). The part of the shoulder blade connected to the collarbone is called the acromion. Common problems with and treatments for the AC joint are detailed below. °ARTHRITIS °Arthritis occurs when the joint has been injured and the smooth padding between the joints (cartilage) is lost. This is the wear and tear seen in most joints of the body if they have been overused. This causes the joint to produce pain and swelling which is worse with activity.  °AC JOINT SEPARATION °AC joint separation means that the ligaments connecting the acromion of the shoulder blade and collarbone have been damaged, and the two bones no longer line up. AC separations can be anywhere from mild to severe, and are "graded" depending upon which ligaments are torn and how badly they are torn. °· Grade I Injury: the least damage is done, and the AC joint still lines up. °· Grade II Injury: damage to the ligaments which reinforce the AC joint. In a Grade II injury, these ligaments are stretched but not entirely torn. When stressed, the AC joint becomes painful and unstable. °· Grade III Injury: AC and secondary ligaments are completely torn, and the collarbone is no longer attached to the shoulder blade. This results in deformity; a prominence of the end of the clavicle. °AC JOINT FRACTURE °AC joint fracture means that there has been a break in the bones of the AC joint, usually the end of the clavicle. °TREATMENT °TREATMENT OF AC ARTHRITIS °· There is currently no way to replace the cartilage damaged by arthritis. The best way to improve the condition is to decrease the activities which aggravate the problem. Application of ice to the joint helps decrease pain and soreness (inflammation). The use of non-steroidal anti-inflammatory medication is helpful. °· If less conservative measures do not  work, then cortisone shots (injections) may be used. These are anti-inflammatories; they decrease the soreness in the joint and swelling. °· If non-surgical measures fail, surgery may be recommended. The procedure is generally removal of a portion of the end of the clavicle. This is the part of the collarbone closest to your acromion which is stabilized with ligaments to the acromion of the shoulder blade. This surgery may be performed using a tube-like instrument with a light (arthroscope) for looking into a joint. It may also be performed as an open surgery through a small incision by the surgeon. Most patients will have good range of motion within 6 weeks and may return to all activity including sports by 8-12 weeks, barring complications. °TREATMENT OF AN AC SEPARATION °· The initial treatment is to decrease pain. This is best accomplished by immobilizing the arm in a sling and placing an ice pack to the shoulder for 20 to 30 minutes every 2 hours as needed. As the pain starts to subside, it is important to begin moving the fingers, wrist, elbow and eventually the shoulder in order to prevent a stiff or "frozen" shoulder. Instruction on when and how much to move the shoulder will be provided by your caregiver. The length of time needed to regain full motion and function depends on the amount or grade of the injury. Recovery from a Grade I AC separation usually takes 10 to 14 days, whereas a Grade III may take 6 to 8 weeks. °· Grade I and II separations usually do not require surgery. Even Grade III injuries usually allow return to full   activity with few restrictions. Treatment is also based on the activity demands of the injured shoulder. For example, a high level quarterback with an injured throwing arm will receive more aggressive treatment than someone with a desk job who rarely uses his/her arm for strenuous activities. In some cases, a painful lump may persist which could require a later surgery. Surgery  can be very successful, but the benefits must be weighed against the potential risks. °TREATMENT OF AN AC JOINT FRACTURE °Fracture treatment depends on the type of fracture. Sometimes a splint or sling may be all that is required. Other times surgery may be required for repair. This is more frequently the case when the ligaments supporting the clavicle are completely torn. Your caregiver will help you with these decisions and together you can decide what will be the best treatment. °HOME CARE INSTRUCTIONS  °· Apply ice to the injury for 15-20 minutes each hour while awake for 2 days. Put the ice in a plastic bag and place a towel between the bag of ice and skin. °· If a sling has been applied, wear it constantly for as long as directed by your caregiver, even at night. The sling or splint can be removed for bathing or showering or as directed. Be sure to keep the shoulder in the same place as when the sling is on. Do not lift the arm. °· If a figure-of-eight splint has been applied it should be tightened gently by another person every day. Tighten it enough to keep the shoulders held back. Allow enough room to place the index finger between the body and strap. Loosen the splint immediately if there is numbness or tingling in the hands. °· Take over-the-counter or prescription medicines for pain, discomfort or fever as directed by your caregiver. °· If you or your child has received a follow up appointment, it is very important to keep that appointment in order to avoid long term complications, chronic pain or disability. °SEEK MEDICAL CARE IF:  °· The pain is not relieved with medications. °· There is increased swelling or discoloration that continues to get worse rather than better. °· You or your child has been unable to follow up as instructed. °· There is progressive numbness and tingling in the arm, forearm or hand. °SEEK IMMEDIATE MEDICAL CARE IF:  °· The arm is numb, cold or pale. °· There is increasing pain  in the hand, forearm or fingers. °MAKE SURE YOU:  °· Understand these instructions. °· Will watch your condition. °· Will get help right away if you are not doing well or get worse. °Document Released: 06/28/2005 Document Revised: 12/11/2011 Document Reviewed: 12/21/2008 °[employer]® Patient Information ©2015 [employer]. This information is not intended to replace advice given to you by your health care provider. Make sure you discuss any questions you have with your health care provider. ° °

## 2014-05-07 NOTE — ED Provider Notes (Signed)
CSN: [id number]     Arrival date & time 05/07/14  1333 History  This chart was scribed for Delos Haring, PA-C, working with Evelina Bucy, MD by Girtha Hake, ED Scribe. The patient was seen in TR09C/TR09C. The patient's care was started at 3:04 PM.   Chief Complaint  Patient presents with  . Fall  . Head Injury   Patient is a 73 y.o. female presenting with fall and head injury. The history is provided by the patient. No language interpreter was used.  Fall  Head Injury  HPI Comments: Danielle Pierce is a 73 y.o. female who presents to the Emergency Department sent by Urgent Care because they wanted a head CT to be done. She is complaining of a fall that occurred last night at approximately midnight. She reports that she slipped and fell up the stairs and hit her head on the ground. Patient denies LOC. Patient also complains of  right shoulder pain that is exacerbated by ROM. She denies being concerned about her head. Sat her shoulder pain is mild. Patient took 800 mg ibuprofen with relief of pain. She was able to drive to the ED today. Patient denies weakness or trouble ambulating.   Patient has an appointment with Dr. Durward Fortes tomorrow. PCP is Dr. Nyoka Cowden.   Past Medical History  Diagnosis Date  . GERD (gastroesophageal reflux disease)   . Unspecified vitamin D deficiency   . Hypertension   . Fibrocystic breast   . Hiatal hernia   . Arthritis     Ankle  . Obesity, unspecified   . Other diseases of nasal cavity and sinuses(478.19)   . Impacted cerumen 11/07/2011  . Cervicitis and endocervicitis 09/13/2011  . Pain in joint, pelvic region and thigh   . Pain in joint, ankle and foot   . Disorder of bone and cartilage, unspecified   . Pain in joint, shoulder region   . Benign neoplasm of colon   . Reflux esophagitis   . External hemorrhoids without mention of complication   . Unspecified essential hypertension   . Asymptomatic varicose veins   . Generalized osteoarthrosis,  unspecified site   . Disorders of bursae and tendons in shoulder region, unspecified    Past Surgical History  Procedure Laterality Date  . Mouth surgery  2005    DR LUTINS  . Flexible sigmoidoscopy  1990    Hemorrhoids   . Colonoscopy  2006    Dr.Orr  . Colonoscopy  07/08/2013    Dr. Carlean Purl   Family History  Problem Relation Age of Onset  . Hypertension Mother   . Kidney disease Mother     Renal failure  . Cancer Brother     Lymphoma  . ADD / ADHD Son   . Seizures Son   . Hypertension Sister   . Hypertension Sister   . Hypertension Sister   . Early death Brother     Some type of accident  . Colon cancer Father 11   History  Substance Use Topics  . Smoking status: Never Smoker   . Smokeless tobacco: Never Used  . Alcohol Use: No   OB History   Grav Para Term Preterm Abortions TAB SAB Ect Mult Living                 Review of Systems  Musculoskeletal: Positive for arthralgias (right shoulder). Negative for gait problem.  Neurological: Negative for syncope and weakness.  All other systems reviewed and are negative.  Allergies  Review of patient's allergies indicates no known allergies.  Home Medications   Prior to Admission medications   Medication Sig Start Date End Date Taking? Authorizing Provider  aspirin 81 MG tablet Take 81 mg by mouth daily. Take 1 tablet once a day.    Historical Provider, MD  Calcium Carbonate-Vitamin D (CALCIUM-VITAMIN D) 500-200 MG-UNIT per tablet Take 1 tablet by mouth 2 (two) times daily. Take 1 tablet twice a day to help bones.    Historical Provider, MD  Carboxymethylcell-Hypromellose (GENTEAL) 0.25-0.3 % GEL Apply 1 application to eye daily. Use 1 application to each eye as needed to alleviate irritation.    Historical Provider, MD  Cholecalciferol (VITAMIN D-3 PO) Take 1 tablet by mouth daily.     Historical Provider, MD  hydrocortisone (ANUCORT-HC) 25 MG suppository Insert 1 suppository rectally once daily for  Hemorrhoids. 12/03/13   Estill Dooms, MD  ibuprofen (ADVIL,MOTRIN) 800 MG tablet TAKE 1 TABLET UP TO 4 TIMES A DAY AS NEEDED FOR PAIN. 07/29/13   Estill Dooms, MD  losartan-hydrochlorothiazide [facility]) 50-12.5 MG per tablet TAKE 1 TABLET DAILY FOR BLOOD PRESSURE. 04/10/14   Estill Dooms, MD  Tdap [facility] Reges) 5-2.5-18.5 LF-MCG/0.5 injection Inject 0.5 mLs into the muscle once. 04/08/14   Estill Dooms, MD  vitamin B-12 (CYANOCOBALAMIN) 1000 MCG tablet Take 1,000 mcg by mouth daily. Take 1 tablet once a day.    Historical Provider, MD  vitamin C (ASCORBIC ACID) 500 MG tablet Take 500 mg by mouth daily. Take 1 tablet once a day.    Historical Provider, MD  vitamin E 400 UNIT capsule Take 400 Units by mouth daily. Take 1 capsule once a day.    Historical Provider, MD   Triage Vitals: BP 154/83  Pulse 64  Temp(Src) 97.8 F (36.6 C) (Oral)  Resp 18  Wt 231 lb 8 oz (105.008 kg)  SpO2 97% Physical Exam  Nursing note and vitals reviewed. Constitutional: She is oriented to person, place, and time. She appears well-developed and well-nourished. No distress.  HENT:  Head: Normocephalic and atraumatic. Head is without raccoon's eyes, without Battle's sign, without abrasion and without contusion.  Eyes: Conjunctivae and EOM are normal.  Neck: Neck supple. No spinous process tenderness and no muscular tenderness present. No rigidity. No tracheal deviation present.  Cardiovascular: Normal rate.   Pulmonary/Chest: Effort normal. No respiratory distress.  Musculoskeletal: Normal range of motion.       Right shoulder: She exhibits pain. She exhibits normal range of motion, no tenderness, no bony tenderness, no swelling, no effusion, no crepitus, no deformity, no laceration, no spasm, normal pulse and normal strength.  Neurological: She is alert and oriented to person, place, and time. She has normal strength. No cranial nerve deficit or sensory deficit. GCS eye subscore is 4. GCS verbal subscore is 5. GCS  motor subscore is 6.  Skin: Skin is warm and dry.  Psychiatric: She has a normal mood and affect. Her behavior is normal.    ED Course  Procedures (including critical care time) DIAGNOSTIC STUDIES: Oxygen Saturation is 97% on room air, normal by my interpretation.    COORDINATION OF CARE: 3:08 PM-Discussed treatment plan which includes a heat CT w/o contrast with pt at bedside and pt agreed to plan.     Labs Review Labs Reviewed - No data to display  Imaging Review Ct Head Wo Contrast  05/07/2014   CLINICAL DATA:  Fall  EXAM: CT HEAD WITHOUT CONTRAST  TECHNIQUE:  Contiguous axial images were obtained from the base of the skull through the vertex without intravenous contrast.  COMPARISON:  None.  FINDINGS: No skull fracture is noted. Paranasal sinuses and mastoid air cells are unremarkable. No intracranial hemorrhage, mass effect or midline shift. Mild cerebral atrophy. Moderate periventricular and patchy subcortical white matter decreased attenuation probable due to chronic small vessel ischemic changes. No acute cortical infarction. No mass lesion is noted on this unenhanced scan.  IMPRESSION: No acute intracranial abnormality. Mild cerebral atrophy. Periventricular and patchy subcortical white matter decreased attenuation probable due to chronic small vessel ischemic changes.   Electronically Signed   By: Lahoma Crocker M.D.   On: 05/07/2014 14:46     EKG Interpretation None      MDM   Final diagnoses:  Fall (on)(from) incline, initial encounter  Shoulder contusion, right, initial encounter    Discussed case with Dr. Mingo Amber. Pt has had a non acute head CT, her shoulder exam is very mild and not impressive. She is going to see her Orthopedic Dr. Durward Fortes tomorrow due to her shoulder pain. Declines wanting anything for pain. The patient looks very well. Dr. Mingo Amber feels comfortable letting patient going home and plan.  73 y.o.Danielle Pierce's evaluation in the Emergency Department  is complete. It has been determined that no acute conditions requiring further emergency intervention are present at this time. The patient/guardian have been advised of the diagnosis and plan. We have discussed signs and symptoms that warrant return to the ED, such as changes or worsening in symptoms.  Vital signs are stable at discharge. Filed Vitals:   05/07/14 1446  BP: 154/83  Pulse: 64  Temp:   Resp: 18    Patient/guardian has voiced understanding and agreed to follow-up with the PCP or specialist.  I personally performed the services described in this documentation, which was scribed in my presence. The recorded information has been reviewed and is accurate.   Linus Mako, PA-C 05/08/14 1055

## 2014-05-08 NOTE — ED Provider Notes (Signed)
Medical screening examination/treatment/procedure(s) were performed by non-physician practitioner and as supervising physician I was immediately available for consultation/collaboration.   EKG Interpretation None        Tanna Furry, MD 05/08/14 1535

## 2014-05-12 ENCOUNTER — Other Ambulatory Visit: Payer: Self-pay | Admitting: Orthopaedic Surgery

## 2014-05-12 DIAGNOSIS — M25511 Pain in right shoulder: Secondary | ICD-10-CM

## 2014-05-14 ENCOUNTER — Ambulatory Visit
Admission: RE | Admit: 2014-05-14 | Discharge: 2014-05-14 | Disposition: A | Discharge status: Home / Self Care | Payer: Medicare Other | Source: Ambulatory Visit | Attending: Orthopaedic Surgery | Admitting: Orthopaedic Surgery

## 2014-05-14 DIAGNOSIS — M25511 Pain in right shoulder: Secondary | ICD-10-CM

## 2014-06-17 LAB — HM DEXA SCAN

## 2014-06-24 ENCOUNTER — Encounter: Payer: Self-pay | Admitting: *Deleted

## 2014-07-02 DIAGNOSIS — M25511 Pain in right shoulder: Secondary | ICD-10-CM

## 2014-07-02 DIAGNOSIS — M25512 Pain in left shoulder: Secondary | ICD-10-CM

## 2014-07-02 HISTORY — PX: SHOULDER ARTHROSCOPY W/ ACROMIAL REPAIR: SUR94

## 2014-07-02 HISTORY — DX: Pain in right shoulder: M25.511

## 2014-08-10 ENCOUNTER — Other Ambulatory Visit: Payer: Medicare Other

## 2014-08-10 DIAGNOSIS — E119 Type 2 diabetes mellitus without complications: Secondary | ICD-10-CM

## 2014-08-10 DIAGNOSIS — I10 Essential (primary) hypertension: Secondary | ICD-10-CM

## 2014-08-11 LAB — COMPREHENSIVE METABOLIC PANEL
ALT: 18 IU/L (ref 0–32)
AST: 24 IU/L (ref 0–40)
Albumin/Globulin Ratio: 2.2 (ref 1.1–2.5)
Albumin: 4.4 g/dL (ref 3.5–4.8)
Alkaline Phosphatase: 90 IU/L (ref 39–117)
BUN/Creatinine Ratio: 15 (ref 11–26)
BUN: 16 mg/dL (ref 8–27)
CALCIUM: 9.6 mg/dL (ref 8.7–10.3)
CO2: 24 mmol/L (ref 18–29)
CREATININE: 1.07 mg/dL — AB (ref 0.57–1.00)
Chloride: 101 mmol/L (ref 97–108)
GFR calc Af Amer: 60 mL/min/{1.73_m2} (ref >59)
GFR calc non Af Amer: 52 mL/min/{1.73_m2} — ABNORMAL LOW (ref >59)
GLOBULIN, TOTAL: 2 g/dL (ref 1.5–4.5)
Glucose: 106 mg/dL — ABNORMAL HIGH (ref 65–99)
Potassium: 4 mmol/L (ref 3.5–5.2)
Sodium: 143 mmol/L (ref 134–144)
TOTAL PROTEIN: 6.4 g/dL (ref 6.0–8.5)
Total Bilirubin: 0.4 mg/dL (ref 0.0–1.2)

## 2014-08-11 LAB — MICROALBUMIN, URINE: MICROALBUM., U, RANDOM: 27.5 ug/mL — AB (ref 0.0–17.0)

## 2014-08-11 LAB — HEMOGLOBIN A1C
Est. average glucose Bld gHb Est-mCnc: 131 mg/dL
Hgb A1c MFr Bld: 6.2 % — ABNORMAL HIGH (ref 4.8–5.6)

## 2014-08-12 ENCOUNTER — Encounter: Payer: Self-pay | Admitting: Internal Medicine

## 2014-08-12 ENCOUNTER — Other Ambulatory Visit: Payer: Self-pay | Admitting: Internal Medicine

## 2014-08-12 ENCOUNTER — Ambulatory Visit (INDEPENDENT_AMBULATORY_CARE_PROVIDER_SITE_OTHER): Payer: Medicare Other | Admitting: Internal Medicine

## 2014-08-12 VITALS — BP 130/60 | HR 68 | Temp 97.7°F | Ht 61.0 in | Wt 234.2 lb

## 2014-08-12 DIAGNOSIS — I10 Essential (primary) hypertension: Secondary | ICD-10-CM

## 2014-08-12 DIAGNOSIS — M25511 Pain in right shoulder: Secondary | ICD-10-CM

## 2014-08-12 DIAGNOSIS — E1122 Type 2 diabetes mellitus with diabetic chronic kidney disease: Secondary | ICD-10-CM

## 2014-08-12 DIAGNOSIS — N189 Chronic kidney disease, unspecified: Secondary | ICD-10-CM

## 2014-08-12 DIAGNOSIS — E669 Obesity, unspecified: Secondary | ICD-10-CM

## 2014-08-12 NOTE — Progress Notes (Signed)
Patient ID: Danielle Pierce, female   DOB: [birth year], 73 y.o.   MRN: [id number]    Facility  [facility]    Place of Service:   OFFICE   No Known Allergies  Chief Complaint  Patient presents with  . Follow-up    4 month Follow Up, Discuss labs (copy printed)  . Immunizations    Immunizations    HPI:  Type 2 diabetes mellitus with diabetic chronic kidney disease: very mild deviation in the creatinine. DM is controlled.  Essential hypertension: controlled  Obesity: Was down to 228# , but regained weight after she fell and injured the right shoulder.  Right shoulder pain: fell late Sept 2015. Had right shoulder pain and then right shoulder surgery by Dr. Durward Fortes to correct arthritic spur problems.   Bilateral shoulder pain: chronic mild discomfort.    Medications: Patient's Medications  New Prescriptions   No medications on file  Previous Medications   ASPIRIN 81 MG TABLET    Take 81 mg by mouth daily. Take 1 tablet once a day.   CALCIUM CARBONATE-VITAMIN D (CALCIUM-VITAMIN D) 500-200 MG-UNIT PER TABLET    Take 1 tablet by mouth 2 (two) times daily. Take 1 tablet twice a day to help bones.   CARBOXYMETHYLCELL-HYPROMELLOSE (GENTEAL) 0.25-0.3 % GEL    Apply 1 application to eye daily. Use 1 application to each eye as needed to alleviate irritation.   CHOLECALCIFEROL (VITAMIN D-3 PO)    Take 1 tablet by mouth daily.    HYDROCORTISONE (ANUCORT-HC) 25 MG SUPPOSITORY    Insert 1 suppository rectally once daily for Hemorrhoids.   IBUPROFEN (ADVIL,MOTRIN) 800 MG TABLET    TAKE 1 TABLET UP TO 4 TIMES A DAY AS NEEDED FOR PAIN.   LOSARTAN-HYDROCHLOROTHIAZIDE (HYZAAR) 50-12.5 MG PER TABLET    TAKE 1 TABLET DAILY FOR BLOOD PRESSURE.   TDAP (BOOSTRIX) 5-2.5-18.5 LF-MCG/0.5 INJECTION    Inject 0.5 mLs into the muscle once.   VITAMIN B-12 (CYANOCOBALAMIN) 1000 MCG TABLET    Take 1,000 mcg by mouth daily. Take 1 tablet once a day.   VITAMIN C (ASCORBIC ACID) 500 MG TABLET    Take 500 mg by  mouth daily. Take 1 tablet once a day.   VITAMIN E 400 UNIT CAPSULE    Take 400 Units by mouth daily. Take 1 capsule once a day.  Modified Medications   No medications on file  Discontinued Medications   No medications on file     Review of Systems  Constitutional: Negative.  Negative for activity change, appetite change and unexpected weight change.  HENT: Negative.   Eyes: Negative.   Respiratory: Negative.   Cardiovascular: Negative for chest pain, palpitations and leg swelling.  Gastrointestinal: Positive for blood in stool. Negative for nausea, abdominal pain, diarrhea, abdominal distention and rectal pain.        Hematochezia 03/07/13. No abdominal pain. Mild discomfort in lower back. Colonoscopy done October 2014 due to a history of adenomatous polyp and family history. Sees Dr. Carlean Purl.  Endocrine:       Diabetic. Mild increase in creatinine.  Genitourinary: Negative.   Musculoskeletal:       Bilateral mild shoulder pains. S/P right shoulder surgery.  Skin: Negative.   Allergic/Immunologic: Negative.   Neurological: Negative.   Hematological: Negative.   Psychiatric/Behavioral: Negative.     Filed Vitals:   08/12/14 1205  BP: 130/60  Pulse: 68  Temp: 97.7 F (36.5 C)  TempSrc: Oral  Height: 5\' 1"  (1.549 m)  Weight:  234 lb 3.2 oz (106.232 kg)  SpO2: 95%   Body mass index is 44.27 kg/(m^2).  Physical Exam  Constitutional: She is oriented to person, place, and time.  Obese.  HENT:  Head: Normocephalic and atraumatic.  Right Ear: External ear normal.  Left Ear: External ear normal.  Nose: Nose normal.  Mouth/Throat: Oropharynx is clear and moist.  Eyes: Conjunctivae and EOM are normal. Pupils are equal, round, and reactive to light.  Neck: No JVD present. No tracheal deviation present. No thyromegaly present.  Cardiovascular: Normal rate, regular rhythm, normal heart sounds and intact distal pulses.   Pulmonary/Chest: No respiratory distress. She has no  wheezes. She has no rales. She exhibits no tenderness.  Abdominal: Bowel sounds are normal. She exhibits no distension and no mass. There is no tenderness.  Musculoskeletal: Normal range of motion. She exhibits no edema or tenderness.  No focal spots of pain in back or pelvis on palpation Scar right shoulder from surgery 07/02/2014.  Lymphadenopathy:    She has no cervical adenopathy.  Neurological: She is alert and oriented to person, place, and time. She has normal reflexes. No cranial nerve deficit.  Intact vibratory sensation.  Skin: No rash noted. No erythema. No pallor.  Psychiatric: She has a normal mood and affect. Her behavior is normal. Judgment and thought content normal.     Labs reviewed: Appointment on 08/10/2014  Component Date Value Ref Range Status  . Glucose 08/10/2014 106* 65 - 99 mg/dL Final  . BUN 08/10/2014 16  8 - 27 mg/dL Final  . Creatinine, Ser 08/10/2014 1.07* 0.57 - 1.00 mg/dL Final  . GFR calc non Af Amer 08/10/2014 52* >59 mL/min/1.73 Final  . GFR calc Af Amer 08/10/2014 60  >59 mL/min/1.73 Final  . BUN/Creatinine Ratio 08/10/2014 15  11 - 26 Final  . Sodium 08/10/2014 143  134 - 144 mmol/L Final  . Potassium 08/10/2014 4.0  3.5 - 5.2 mmol/L Final  . Chloride 08/10/2014 101  97 - 108 mmol/L Final  . CO2 08/10/2014 24  18 - 29 mmol/L Final  . Calcium 08/10/2014 9.6  8.7 - 10.3 mg/dL Final  . Total Protein 08/10/2014 6.4  6.0 - 8.5 g/dL Final  . Albumin 08/10/2014 4.4  3.5 - 4.8 g/dL Final  . Globulin, Total 08/10/2014 2.0  1.5 - 4.5 g/dL Final  . Albumin/Globulin Ratio 08/10/2014 2.2  1.1 - 2.5 Final  . Total Bilirubin 08/10/2014 0.4  0.0 - 1.2 mg/dL Final  . Alkaline Phosphatase 08/10/2014 90  39 - 117 IU/L Final  . AST 08/10/2014 24  0 - 40 IU/L Final  . ALT 08/10/2014 18  0 - 32 IU/L Final  . Hgb A1c MFr Bld 08/10/2014 6.2* 4.8 - 5.6 % Final   Comment:          Pre-diabetes: 5.7 - 6.4          Diabetes: >6.4          Glycemic control for adults  with diabetes: <7.0   . Est. average glucose Bld gHb Est-m* 08/10/2014 131   Final  . Microalbum.,U,Random 08/10/2014 27.5* 0.0 - 17.0 ug/mL Final  Abstract on 06/24/2014  Component Date Value Ref Range Status  . HM Dexa Scan 06/17/2014 [id number] Density   Final     Assessment/Plan 1. Type 2 diabetes mellitus with diabetic chronic kidney disease - Hemoglobin A1c; Future - Comprehensive metabolic panel; Future  2. Essential hypertension - Comprehensive metabolic panel; Future  3. Obesity Resume walks  and exercises. Follow diet.  4. Right shoulder pain Improving. Continue exercises.

## 2014-09-02 ENCOUNTER — Other Ambulatory Visit: Payer: Self-pay | Admitting: Internal Medicine

## 2014-10-14 ENCOUNTER — Other Ambulatory Visit: Payer: Self-pay | Admitting: Internal Medicine

## 2014-10-14 ENCOUNTER — Other Ambulatory Visit: Payer: Self-pay | Admitting: *Deleted

## 2014-10-14 MED ORDER — LOSARTAN POTASSIUM-HCTZ 50-12.5 MG PO TABS: ORAL_TABLET | ORAL | Status: DC

## 2014-10-14 NOTE — Telephone Encounter (Signed)
[facility]  

## 2014-12-11 ENCOUNTER — Other Ambulatory Visit: Payer: Medicare Other

## 2014-12-15 ENCOUNTER — Ambulatory Visit: Payer: Medicare Other | Admitting: Internal Medicine

## 2014-12-18 ENCOUNTER — Telehealth: Payer: Self-pay | Admitting: Internal Medicine

## 2014-12-18 NOTE — Telephone Encounter (Signed)
Called pt left message regarding Flu Shot..Did she have a flu shot for 2015.  [facility]

## 2015-01-04 ENCOUNTER — Other Ambulatory Visit: Payer: Medicare Other

## 2015-01-04 DIAGNOSIS — E1122 Type 2 diabetes mellitus with diabetic chronic kidney disease: Secondary | ICD-10-CM

## 2015-01-04 DIAGNOSIS — I10 Essential (primary) hypertension: Secondary | ICD-10-CM

## 2015-01-05 LAB — COMPREHENSIVE METABOLIC PANEL
ALT: 13 IU/L (ref 0–32)
AST: 24 IU/L (ref 0–40)
Albumin/Globulin Ratio: 2.2 (ref 1.1–2.5)
Albumin: 4.3 g/dL (ref 3.5–4.8)
Alkaline Phosphatase: 90 IU/L (ref 39–117)
BUN / CREAT RATIO: 12 (ref 11–26)
BUN: 13 mg/dL (ref 8–27)
Bilirubin Total: 0.6 mg/dL (ref 0.0–1.2)
CALCIUM: 9.2 mg/dL (ref 8.7–10.3)
CO2: 24 mmol/L (ref 18–29)
Chloride: 103 mmol/L (ref 97–108)
Creatinine, Ser: 1.06 mg/dL — ABNORMAL HIGH (ref 0.57–1.00)
GFR calc Af Amer: 61 mL/min/{1.73_m2} (ref >59)
GFR, EST NON AFRICAN AMERICAN: 53 mL/min/{1.73_m2} — AB (ref >59)
GLUCOSE: 111 mg/dL — AB (ref 65–99)
Globulin, Total: 2 g/dL (ref 1.5–4.5)
Potassium: 4 mmol/L (ref 3.5–5.2)
Sodium: 141 mmol/L (ref 134–144)
Total Protein: 6.3 g/dL (ref 6.0–8.5)

## 2015-01-05 LAB — HEMOGLOBIN A1C
Est. average glucose Bld gHb Est-mCnc: 137 mg/dL
HEMOGLOBIN A1C: 6.4 % — AB (ref 4.8–5.6)

## 2015-01-06 ENCOUNTER — Ambulatory Visit (INDEPENDENT_AMBULATORY_CARE_PROVIDER_SITE_OTHER): Payer: Medicare Other | Admitting: Internal Medicine

## 2015-01-06 ENCOUNTER — Encounter: Payer: Self-pay | Admitting: Internal Medicine

## 2015-01-06 VITALS — BP 138/88 | HR 69 | Temp 97.5°F | Ht 61.0 in | Wt 237.2 lb

## 2015-01-06 DIAGNOSIS — E1122 Type 2 diabetes mellitus with diabetic chronic kidney disease: Secondary | ICD-10-CM

## 2015-01-06 DIAGNOSIS — I10 Essential (primary) hypertension: Secondary | ICD-10-CM

## 2015-01-06 DIAGNOSIS — N189 Chronic kidney disease, unspecified: Secondary | ICD-10-CM

## 2015-01-06 DIAGNOSIS — E669 Obesity, unspecified: Secondary | ICD-10-CM

## 2015-01-06 NOTE — Progress Notes (Signed)
Patient ID: Danielle Pierce, female   DOB: 1940-10-07, 74 y.o.   MRN: [id number]    Facility  [facility]    Place of Service:   OFFICE   No Known Allergies  Chief Complaint  Patient presents with  . Medical Management of Chronic Issues    4 Month follow up    HPI:  Type 2 diabetes mellitus with diabetic chronic kidney disease - Diet controlled , but she does not adhere to the diet. Admits to eating too many sweets.  Essential hypertension - controlled  Obesity: no success in weight loss. Considering going to [employer].    Medications: Patient's Medications  New Prescriptions   No medications on file  Previous Medications   ANUCORT-HC 25 MG SUPPOSITORY    INSERT 1 SUPPOSITORY PER RECTUM ONCE DAILY FOR HEMORRHOIDS.   ASPIRIN 81 MG TABLET    Take 81 mg by mouth daily. Take 1 tablet once a day.   CALCIUM CARBONATE-VITAMIN D (CALCIUM-VITAMIN D) 500-200 MG-UNIT PER TABLET    Take 1 tablet by mouth 2 (two) times daily. Take 1 tablet twice a day to help bones.   CARBOXYMETHYLCELL-HYPROMELLOSE (GENTEAL) 0.25-0.3 % GEL    Apply 1 application to eye daily. Use 1 application to each eye as needed to alleviate irritation.   CHOLECALCIFEROL (VITAMIN D-3 PO)    Take 1 tablet by mouth daily.    HYDROCORTISONE (ANUCORT-HC) 25 MG SUPPOSITORY    Insert 1 suppository rectally once daily for Hemorrhoids.   IBUPROFEN (ADVIL,MOTRIN) 800 MG TABLET    TAKE 1 TABLET UP TO FOUR TIMES DAILY AS NEEDED FOR PAIN.   LOSARTAN-HYDROCHLOROTHIAZIDE (HYZAAR) 50-12.5 MG PER TABLET    TAKE 1 TABLET DAILY FOR BLOOD PRESSURE.   TDAP (BOOSTRIX) 5-2.5-18.5 LF-MCG/0.5 INJECTION    Inject 0.5 mLs into the muscle once.   VITAMIN B-12 (CYANOCOBALAMIN) 1000 MCG TABLET    Take 1,000 mcg by mouth daily. Take 1 tablet once a day.   VITAMIN C (ASCORBIC ACID) 500 MG TABLET    Take 500 mg by mouth daily. Take 1 tablet once a day.   VITAMIN E 400 UNIT CAPSULE    Take 400 Units by mouth daily. Take 1 capsule once a day.    Modified Medications   No medications on file  Discontinued Medications   No medications on file     Review of Systems  Constitutional: Negative.  Negative for activity change, appetite change and unexpected weight change.  HENT: Negative.   Eyes: Negative.   Respiratory: Negative.   Cardiovascular: Negative for chest pain, palpitations and leg swelling.  Gastrointestinal: Positive for blood in stool. Negative for nausea, abdominal pain, diarrhea, abdominal distention and rectal pain.        Hematochezia 03/07/13. No abdominal pain. Mild discomfort in lower back. Colonoscopy done October 2014 due to a history of adenomatous polyp and family history. Sees Dr. Carlean Purl.  Endocrine:       Diabetic. Mild increase in creatinine.  Genitourinary: Negative.   Musculoskeletal:       Bilateral mild shoulder pains. S/P right shoulder surgery.  Skin: Negative.   Allergic/Immunologic: Negative.   Neurological: Negative.   Hematological: Negative.   Psychiatric/Behavioral: Negative.     Filed Vitals:   01/06/15 1448  BP: 138/88  Pulse: 69  Temp: 97.5 F (36.4 C)  TempSrc: Oral  Height: 5\' 1"  (1.549 m)  Weight: 237 lb 3.2 oz (107.593 kg)   Body mass index is 44.84 kg/(m^2).  Physical Exam  Constitutional: She is oriented to person, place, and time.  Obese.  HENT:  Head: Normocephalic and atraumatic.  Right Ear: External ear normal.  Left Ear: External ear normal.  Nose: Nose normal.  Mouth/Throat: Oropharynx is clear and moist.  Eyes: Conjunctivae and EOM are normal. Pupils are equal, round, and reactive to light.  Neck: No JVD present. No tracheal deviation present. No thyromegaly present.  Cardiovascular: Normal rate, regular rhythm, normal heart sounds and intact distal pulses.   Pulmonary/Chest: No respiratory distress. She has no wheezes. She has no rales. She exhibits no tenderness.  Abdominal: Bowel sounds are normal. She exhibits no distension and no mass. There is no  tenderness.  Musculoskeletal: Normal range of motion. She exhibits no edema or tenderness.  No focal spots of pain in back or pelvis on palpation Scar right shoulder from surgery 07/02/2014.  Lymphadenopathy:    She has no cervical adenopathy.  Neurological: She is alert and oriented to person, place, and time. She has normal reflexes. No cranial nerve deficit.  Intact vibratory sensation.  Skin: No rash noted. No erythema. No pallor.  Psychiatric: She has a normal mood and affect. Her behavior is normal. Judgment and thought content normal.     Labs reviewed: Appointment on 01/04/2015  Component Date Value Ref Range Status  . Glucose 01/04/2015 111* 65 - 99 mg/dL Final  . BUN 01/04/2015 13  8 - 27 mg/dL Final  . Creatinine, Ser 01/04/2015 1.06* 0.57 - 1.00 mg/dL Final  . GFR calc non Af Amer 01/04/2015 53* >59 mL/min/1.73 Final  . GFR calc Af Amer 01/04/2015 61  >59 mL/min/1.73 Final  . BUN/Creatinine Ratio 01/04/2015 12  11 - 26 Final  . Sodium 01/04/2015 141  134 - 144 mmol/L Final  . Potassium 01/04/2015 4.0  3.5 - 5.2 mmol/L Final  . Chloride 01/04/2015 103  97 - 108 mmol/L Final  . CO2 01/04/2015 24  18 - 29 mmol/L Final  . Calcium 01/04/2015 9.2  8.7 - 10.3 mg/dL Final  . Total Protein 01/04/2015 6.3  6.0 - 8.5 g/dL Final  . Albumin 01/04/2015 4.3  3.5 - 4.8 g/dL Final  . Globulin, Total 01/04/2015 2.0  1.5 - 4.5 g/dL Final  . Albumin/Globulin Ratio 01/04/2015 2.2  1.1 - 2.5 Final  . Bilirubin Total 01/04/2015 0.6  0.0 - 1.2 mg/dL Final  . Alkaline Phosphatase 01/04/2015 90  39 - 117 IU/L Final  . AST 01/04/2015 24  0 - 40 IU/L Final  . ALT 01/04/2015 13  0 - 32 IU/L Final  . Hgb A1c MFr Bld 01/04/2015 6.4* 4.8 - 5.6 % Final   Comment:          Pre-diabetes: 5.7 - 6.4          Diabetes: >6.4          Glycemic control for adults with diabetes: <7.0   . Est. average glucose Bld gHb Est-m* 01/04/2015 137   Final     Assessment/Plan 1. Type 2 diabetes mellitus with  diabetic chronic kidney disease Lose weight - Hemoglobin A1c; Future - Basic metabolic panel; Future - Microalbumin, urine; Future  2. Essential hypertension controlled - Basic metabolic panel; Future  3. Obesity Lose weight.

## 2015-03-25 LAB — HM MAMMOGRAPHY

## 2015-03-26 ENCOUNTER — Encounter: Payer: Self-pay | Admitting: *Deleted

## 2015-04-09 ENCOUNTER — Other Ambulatory Visit: Payer: Self-pay | Admitting: Internal Medicine

## 2015-05-14 ENCOUNTER — Other Ambulatory Visit: Payer: Medicare Other

## 2015-05-14 DIAGNOSIS — E1122 Type 2 diabetes mellitus with diabetic chronic kidney disease: Secondary | ICD-10-CM

## 2015-05-14 DIAGNOSIS — I10 Essential (primary) hypertension: Secondary | ICD-10-CM

## 2015-05-15 LAB — BASIC METABOLIC PANEL
BUN/Creatinine Ratio: 14 (ref 11–26)
BUN: 13 mg/dL (ref 8–27)
CHLORIDE: 101 mmol/L (ref 97–108)
CO2: 23 mmol/L (ref 18–29)
Calcium: 9.1 mg/dL (ref 8.7–10.3)
Creatinine, Ser: 0.94 mg/dL (ref 0.57–1.00)
GFR calc Af Amer: 70 mL/min/{1.73_m2} (ref >59)
GFR calc non Af Amer: 61 mL/min/{1.73_m2} (ref >59)
GLUCOSE: 101 mg/dL — AB (ref 65–99)
Potassium: 3.9 mmol/L (ref 3.5–5.2)
SODIUM: 142 mmol/L (ref 134–144)

## 2015-05-15 LAB — HEMOGLOBIN A1C
Est. average glucose Bld gHb Est-mCnc: 128 mg/dL
Hgb A1c MFr Bld: 6.1 % — ABNORMAL HIGH (ref 4.8–5.6)

## 2015-05-15 LAB — MICROALBUMIN, URINE: Microalbumin, Urine: 20.6 ug/mL

## 2015-05-18 ENCOUNTER — Encounter: Payer: Self-pay | Admitting: Internal Medicine

## 2015-05-18 ENCOUNTER — Ambulatory Visit (INDEPENDENT_AMBULATORY_CARE_PROVIDER_SITE_OTHER): Payer: Medicare Other | Admitting: Internal Medicine

## 2015-05-18 VITALS — BP 140/80 | HR 59 | Temp 97.5°F | Resp 20 | Ht 61.0 in | Wt 231.4 lb

## 2015-05-18 DIAGNOSIS — E1122 Type 2 diabetes mellitus with diabetic chronic kidney disease: Secondary | ICD-10-CM | POA: Diagnosis not present

## 2015-05-18 DIAGNOSIS — I10 Essential (primary) hypertension: Secondary | ICD-10-CM | POA: Diagnosis not present

## 2015-05-18 DIAGNOSIS — N189 Chronic kidney disease, unspecified: Secondary | ICD-10-CM

## 2015-05-18 DIAGNOSIS — L84 Corns and callosities: Secondary | ICD-10-CM | POA: Diagnosis not present

## 2015-05-18 DIAGNOSIS — H6123 Impacted cerumen, bilateral: Secondary | ICD-10-CM | POA: Diagnosis not present

## 2015-05-18 DIAGNOSIS — E669 Obesity, unspecified: Secondary | ICD-10-CM | POA: Diagnosis not present

## 2015-05-18 NOTE — Progress Notes (Deleted)
Patient ID: Danielle Pierce, female   DOB: Aug 10, 1941, 74 y.o.   MRN: [id number]    Facility  [facility]    Place of Service:   OFFICE    No Known Allergies  Chief Complaint  Patient presents with  . Medical Management of Chronic Issues    HPI:  Type 2 diabetes mellitus with diabetic chronic kidney disease  Essential hypertension  Obesity  Corn  Excessive cerumen in both ear canals    Medications: Patient's Medications  New Prescriptions   No medications on file  Previous Medications   ANUCORT-HC 25 MG SUPPOSITORY    INSERT 1 SUPPOSITORY PER RECTUM ONCE DAILY FOR HEMORRHOIDS.   ASPIRIN 81 MG TABLET    Take 81 mg by mouth daily. Take 1 tablet once a day.   CALCIUM CARBONATE-VITAMIN D (CALCIUM-VITAMIN D) 500-200 MG-UNIT PER TABLET    Take 1 tablet by mouth 2 (two) times daily. Take 1 tablet twice a day to help bones.   CARBOXYMETHYLCELL-HYPROMELLOSE (GENTEAL) 0.25-0.3 % GEL    Apply 1 application to eye daily. Use 1 application to each eye as needed to alleviate irritation.   CHOLECALCIFEROL (VITAMIN D-3 PO)    Take 1 tablet by mouth daily.    HYDROCORTISONE (ANUCORT-HC) 25 MG SUPPOSITORY    Insert 1 suppository rectally once daily for Hemorrhoids.   IBUPROFEN (ADVIL,MOTRIN) 800 MG TABLET    TAKE 1 TABLET UP TO FOUR TIMES DAILY AS NEEDED FOR PAIN.   LOSARTAN-HYDROCHLOROTHIAZIDE (HYZAAR) 50-12.5 MG PER TABLET    TAKE 1 TABLET DAILY FOR BLOOD PRESSURE.   TDAP (BOOSTRIX) 5-2.5-18.5 LF-MCG/0.5 INJECTION    Inject 0.5 mLs into the muscle once.   VITAMIN B-12 (CYANOCOBALAMIN) 1000 MCG TABLET    Take 1,000 mcg by mouth daily. Take 1 tablet once a day.   VITAMIN C (ASCORBIC ACID) 500 MG TABLET    Take 500 mg by mouth daily. Take 1 tablet once a day.   VITAMIN E 400 UNIT CAPSULE    Take 400 Units by mouth daily. Take 1 capsule once a day.  Modified Medications   No medications on file  Discontinued Medications   No medications on file     Review of Systems  Constitutional:  Negative.  Negative for activity change, appetite change and unexpected weight change.  HENT: Negative.   Eyes: Negative.   Respiratory: Negative.   Cardiovascular: Negative for chest pain, palpitations and leg swelling.  Gastrointestinal: Positive for blood in stool. Negative for nausea, abdominal pain, diarrhea, abdominal distention and rectal pain.        Hematochezia 03/07/13. No abdominal pain. Mild discomfort in lower back. Colonoscopy done October 2014 due to a history of adenomatous polyp and family history. Sees Dr. Carlean Purl.  Endocrine:       Diabetic.  Genitourinary: Negative.   Musculoskeletal:       Bilateral mild shoulder pains. S/P right shoulder surgery.  Skin: Negative.   Allergic/Immunologic: Negative.   Neurological: Negative.   Hematological: Negative.   Psychiatric/Behavioral: Negative.     Filed Vitals:   05/18/15 1540  BP: 140/80  Pulse: 59  Temp: 97.5 F (36.4 C)  TempSrc: Oral  Resp: 20  Height: 5\' 1"  (1.549 m)  Weight: 231 lb 6.4 oz (104.962 kg)  SpO2: 99%   Body mass index is 43.75 kg/(m^2).  Physical Exam  Constitutional: She is oriented to person, place, and time.  Obese.  HENT:  Head: Normocephalic and atraumatic.  Right Ear: External ear normal.  Left  Ear: External ear normal.  Nose: Nose normal.  Mouth/Throat: Oropharynx is clear and moist.  Eyes: Conjunctivae and EOM are normal. Pupils are equal, round, and reactive to light.  Neck: No JVD present. No tracheal deviation present. No thyromegaly present.  Cardiovascular: Normal rate, regular rhythm, normal heart sounds and intact distal pulses.   Pulmonary/Chest: No respiratory distress. She has no wheezes. She has no rales. She exhibits no tenderness.  Abdominal: Bowel sounds are normal. She exhibits no distension and no mass. There is no tenderness.  Musculoskeletal: Normal range of motion. She exhibits no edema or tenderness.  No focal spots of pain in back or pelvis on palpation Scar  right shoulder from surgery 07/02/2014.  Lymphadenopathy:    She has no cervical adenopathy.  Neurological: She is alert and oriented to person, place, and time. She has normal reflexes. No cranial nerve deficit.  Intact vibratory sensation.  Skin: No rash noted. No erythema. No pallor.  Psychiatric: She has a normal mood and affect. Her behavior is normal. Judgment and thought content normal.     Labs reviewed: Appointment on 05/14/2015  Component Date Value Ref Range Status  . Hgb A1c MFr Bld 05/14/2015 6.1* 4.8 - 5.6 % Final   Comment:          Pre-diabetes: 5.7 - 6.4          Diabetes: >6.4          Glycemic control for adults with diabetes: <7.0   . Est. average glucose Bld gHb Est-m* 05/14/2015 128   Final  . Glucose 05/14/2015 101* 65 - 99 mg/dL Final  . BUN 05/14/2015 13  8 - 27 mg/dL Final  . Creatinine, Ser 05/14/2015 0.94  0.57 - 1.00 mg/dL Final  . GFR calc non Af Amer 05/14/2015 61  >59 mL/min/1.73 Final  . GFR calc Af Amer 05/14/2015 70  >59 mL/min/1.73 Final  . BUN/Creatinine Ratio 05/14/2015 14  11 - 26 Final  . Sodium 05/14/2015 142  134 - 144 mmol/L Final  . Potassium 05/14/2015 3.9  3.5 - 5.2 mmol/L Final  . Chloride 05/14/2015 101  97 - 108 mmol/L Final  . CO2 05/14/2015 23  18 - 29 mmol/L Final  . Calcium 05/14/2015 9.1  8.7 - 10.3 mg/dL Final  . Microalbum.,U,Random 05/14/2015 20.6  Not Estab. ug/mL Final  Abstract on 03/26/2015  Component Date Value Ref Range Status  . HM Mammogram 03/25/2015 [facility]: no mammographic evidence of malignancy   Final   [facility] [phone]     Assessment/Plan   cm

## 2015-05-20 ENCOUNTER — Encounter: Payer: Self-pay | Admitting: *Deleted

## 2015-05-20 LAB — HM DIABETES EYE EXAM

## 2015-05-24 NOTE — Progress Notes (Signed)
Patient ID: Danielle Pierce, female   DOB: [birth year], 74 y.o.   MRN: [id number]    HISTORY AND PHYSICAL  Location:    [facility]   Place of Service:   OFFICE  Extended Emergency Contact Information Primary Emergency Contact: Gorter,Luther Address: [address]          [place], [place] [address] [place] Phone: [phone] Mobile Phone: [phone] Relation: Spouse Secondary Emergency Contact: Magnus Sinning [place] Phone: [phone] Mobile Phone: [phone] Relation: Daughter  Advanced Directive information  fULL CODE  Chief Complaint  Patient presents with  . Medical Management of Chronic Issues    HPI:  Type 2 diabetes mellitus with diabetic chronic kidney disease - controlled  Essential hypertension - controlled  Obesity - has lost 6 pounds since last seen. She has not really been following a diet and does not know how she lost weight.  Corn - painful corns on feet.  Excessive cerumen in both ear canals - no pain. Some difficulty with hearing.    Past Medical History  Diagnosis Date  . GERD (gastroesophageal reflux disease)   . Unspecified vitamin D deficiency   . Hypertension   . Fibrocystic breast   . Hiatal hernia   . Arthritis     Ankle  . Obesity, unspecified   . Other diseases of nasal cavity and sinuses(478.19)   . Impacted cerumen 11/07/2011  . Cervicitis and endocervicitis 09/13/2011  . Pain in joint, pelvic region and thigh   . Pain in joint, ankle and foot   . Disorder of bone and cartilage, unspecified   . Pain in joint, shoulder region   . Benign neoplasm of colon   . Reflux esophagitis   . External hemorrhoids without mention of complication   . Unspecified essential hypertension   . Asymptomatic varicose veins   . Generalized osteoarthrosis, unspecified site   . Disorders of bursae and tendons in shoulder region, unspecified   . Bilateral shoulder pain 07/02/14    Past Surgical  History  Procedure Laterality Date  . Mouth surgery  2005    DR LUTINS  . Flexible sigmoidoscopy  1990    Hemorrhoids   . Colonoscopy  2006    Dr.Orr  . Colonoscopy  07/08/2013    Dr. Carlean Purl  . Shoulder arthroscopy w/ acromial repair Right 07/02/14    Dr. Durward Fortes    Patient Care Team: Estill Dooms, MD as PCP - General (Internal Medicine) Garald Balding, MD as Consulting Physician (Orthopedic Surgery) Rutherford Guys, MD as Consulting Physician (Ophthalmology) Druscilla Brownie, MD as Consulting Physician (Dermatology) Gatha Mayer, MD as Consulting Physician (Gastroenterology)  Social History   Social History  . Marital Status: Married    Spouse Name: N/A  . Number of Children: N/A  . Years of Education: N/A   Occupational History  . Not on file.   Social History Main Topics  . Smoking status: Never Smoker   . Smokeless tobacco: Never Used  . Alcohol Use: No  . Drug Use: No  . Sexual Activity: Not on file   Other Topics Concern  . Not on file   Social History Narrative     reports that she has never smoked. She has never used smokeless tobacco. She reports that she does not drink alcohol or use illicit drugs.  Family History  Problem Relation Age of Onset  . Hypertension Mother   . Kidney disease Mother     Renal failure  .  Cancer Brother     Lymphoma  . ADD / ADHD Son   . Seizures Son   . Hypertension Sister   . Hypertension Sister   . Hypertension Sister   . Early death Brother     Some type of accident  . Colon cancer Father 6   Family Status  Relation Status Death Age  . Mother Deceased     Cause of Death: HTN & Renal failure  . Brother Deceased     Cause of Death: Lymphoma  . Son Alive   . Sister Alive   . Sister Alive   . Sister Alive   . Brother Deceased     Cause of Death: Accidental  . Father Deceased     Cause of Death: Colon cancer  . Sister Deceased in 01/25/2009    Cause of Death: Unknown  . Daughter Alive   . Brother Music therapist History  Administered Date(s) Administered  . PPD Test 10/02/1997  . Td 04/30/1998    No Known Allergies  Medications: Patient's Medications  New Prescriptions   No medications on file  Previous Medications   ANUCORT-HC 25 MG SUPPOSITORY    INSERT 1 SUPPOSITORY PER RECTUM ONCE DAILY FOR HEMORRHOIDS.   ASPIRIN 81 MG TABLET    Take 81 mg by mouth daily. Take 1 tablet once a day.   CALCIUM CARBONATE-VITAMIN D (CALCIUM-VITAMIN D) 500-200 MG-UNIT PER TABLET    Take 1 tablet by mouth 2 (two) times daily. Take 1 tablet twice a day to help bones.   CARBOXYMETHYLCELL-HYPROMELLOSE (GENTEAL) 0.25-0.3 % GEL    Apply 1 application to eye daily. Use 1 application to each eye as needed to alleviate irritation.   CHOLECALCIFEROL (VITAMIN D-3 PO)    Take 1 tablet by mouth daily.    HYDROCORTISONE (ANUCORT-HC) 25 MG SUPPOSITORY    Insert 1 suppository rectally once daily for Hemorrhoids.   IBUPROFEN (ADVIL,MOTRIN) 800 MG TABLET    TAKE 1 TABLET UP TO FOUR TIMES DAILY AS NEEDED FOR PAIN.   LOSARTAN-HYDROCHLOROTHIAZIDE (HYZAAR) 50-12.5 MG PER TABLET    TAKE 1 TABLET DAILY FOR BLOOD PRESSURE.   TDAP (BOOSTRIX) 5-2.5-18.5 LF-MCG/0.5 INJECTION    Inject 0.5 mLs into the muscle once.   VITAMIN B-12 (CYANOCOBALAMIN) 1000 MCG TABLET    Take 1,000 mcg by mouth daily. Take 1 tablet once a day.   VITAMIN C (ASCORBIC ACID) 500 MG TABLET    Take 500 mg by mouth daily. Take 1 tablet once a day.   VITAMIN E 400 UNIT CAPSULE    Take 400 Units by mouth daily. Take 1 capsule once a day.  Modified Medications   No medications on file  Discontinued Medications   No medications on file    Review of Systems  Constitutional: Negative.  Negative for activity change, appetite change and unexpected weight change.  HENT: Positive for hearing loss.   Eyes: Negative.   Respiratory: Negative.   Cardiovascular: Negative for chest pain, palpitations and leg swelling.  Gastrointestinal: Positive for blood in  stool. Negative for nausea, abdominal pain, diarrhea, abdominal distention and rectal pain.        Hematochezia 03/07/13. No abdominal pain. Mild discomfort in lower back. Colonoscopy done October 2014 due to a history of adenomatous polyp and family history. Sees Dr. Carlean Purl.  Endocrine:       Diabetic. Mild increase in creatinine.  Genitourinary: Negative.   Musculoskeletal:       Bilateral mild  shoulder pains. S/P right shoulder surgery.  Skin:       Painful corns on feet  Allergic/Immunologic: Negative.   Neurological: Negative.   Hematological: Negative.   Psychiatric/Behavioral: Negative.     Filed Vitals:   05/18/15 1540  BP: 140/80  Pulse: 59  Temp: 97.5 F (36.4 C)  TempSrc: Oral  Resp: 20  Height: 5\' 1"  (1.549 m)  Weight: 231 lb 6.4 oz (104.962 kg)  SpO2: 99%   Body mass index is 43.75 kg/(m^2).  Physical Exam  Constitutional: She is oriented to person, place, and time.  Obese.  HENT:  Head: Normocephalic and atraumatic.  Right Ear: External ear normal.  Left Ear: External ear normal.  Nose: Nose normal.  Mouth/Throat: Oropharynx is clear and moist.  Eyes: Conjunctivae and EOM are normal. Pupils are equal, round, and reactive to light.  Neck: No JVD present. No tracheal deviation present. No thyromegaly present.  Cardiovascular: Normal rate, regular rhythm, normal heart sounds and intact distal pulses.   Pulmonary/Chest: No respiratory distress. She has no wheezes. She has no rales. She exhibits no tenderness.  Abdominal: Bowel sounds are normal. She exhibits no distension and no mass. There is no tenderness.  Musculoskeletal: Normal range of motion. She exhibits no edema or tenderness.  No focal spots of pain in back or pelvis on palpation Scar right shoulder from surgery 07/02/2014.  Lymphadenopathy:    She has no cervical adenopathy.  Neurological: She is alert and oriented to person, place, and time. She has normal reflexes. No cranial nerve deficit.    Intact vibratory sensation.  Skin: No rash noted. No erythema. No pallor.  Painful corns on feet  Psychiatric: She has a normal mood and affect. Her behavior is normal. Judgment and thought content normal.     Labs reviewed: Appointment on 05/14/2015  Component Date Value Ref Range Status  . Hgb A1c MFr Bld 05/14/2015 6.1* 4.8 - 5.6 % Final   Comment:          Pre-diabetes: 5.7 - 6.4          Diabetes: >6.4          Glycemic control for adults with diabetes: <7.0   . Est. average glucose Bld gHb Est-m* 05/14/2015 128   Final  . Glucose 05/14/2015 101* 65 - 99 mg/dL Final  . BUN 05/14/2015 13  8 - 27 mg/dL Final  . Creatinine, Ser 05/14/2015 0.94  0.57 - 1.00 mg/dL Final  . GFR calc non Af Amer 05/14/2015 61  >59 mL/min/1.73 Final  . GFR calc Af Amer 05/14/2015 70  >59 mL/min/1.73 Final  . BUN/Creatinine Ratio 05/14/2015 14  11 - 26 Final  . Sodium 05/14/2015 142  134 - 144 mmol/L Final  . Potassium 05/14/2015 3.9  3.5 - 5.2 mmol/L Final  . Chloride 05/14/2015 101  97 - 108 mmol/L Final  . CO2 05/14/2015 23  18 - 29 mmol/L Final  . Calcium 05/14/2015 9.1  8.7 - 10.3 mg/dL Final  . Microalbum.,U,Random 05/14/2015 20.6  Not Estab. ug/mL Final  Abstract on 03/26/2015  Component Date Value Ref Range Status  . HM Mammogram 03/25/2015 [facility]: no mammographic evidence of malignancy   Final   [facility] [phone]    Assessment/Plan  1. Type 2 diabetes mellitus with diabetic chronic kidney disease Continue current medication - Hemoglobin A1c; Future - Comprehensive metabolic panel; Future  2. Essential hypertension Continue current medication - Comprehensive metabolic panel; Future  3. Obesity Discussed diet and compliance. Encouraged  her to be more aware of her diet and to follow dietary caloric restrictions as well as no added sweets diet.  4. Corn Sharply debrided while in the office  5. Excessive cerumen in both ear canals Bilateral ear lavage and instrumentation to  remove cerumen

## 2015-09-10 ENCOUNTER — Other Ambulatory Visit: Payer: Medicare Other

## 2015-09-10 DIAGNOSIS — E1122 Type 2 diabetes mellitus with diabetic chronic kidney disease: Secondary | ICD-10-CM

## 2015-09-10 DIAGNOSIS — I10 Essential (primary) hypertension: Secondary | ICD-10-CM

## 2015-09-11 LAB — COMPREHENSIVE METABOLIC PANEL
ALBUMIN: 4.2 g/dL (ref 3.5–4.8)
ALK PHOS: 79 IU/L (ref 39–117)
ALT: 13 IU/L (ref 0–32)
AST: 27 IU/L (ref 0–40)
Albumin/Globulin Ratio: 2.1 (ref 1.1–2.5)
BILIRUBIN TOTAL: 0.4 mg/dL (ref 0.0–1.2)
BUN / CREAT RATIO: 17 (ref 11–26)
BUN: 18 mg/dL (ref 8–27)
CHLORIDE: 106 mmol/L (ref 97–106)
CO2: 22 mmol/L (ref 18–29)
CREATININE: 1.07 mg/dL — AB (ref 0.57–1.00)
Calcium: 9.2 mg/dL (ref 8.7–10.3)
GFR calc non Af Amer: 52 mL/min/{1.73_m2} — ABNORMAL LOW (ref >59)
GFR, EST AFRICAN AMERICAN: 60 mL/min/{1.73_m2} (ref >59)
Globulin, Total: 2 g/dL (ref 1.5–4.5)
Glucose: 96 mg/dL (ref 65–99)
Potassium: 4.1 mmol/L (ref 3.5–5.2)
Sodium: 142 mmol/L (ref 136–144)
TOTAL PROTEIN: 6.2 g/dL (ref 6.0–8.5)

## 2015-09-11 LAB — HEMOGLOBIN A1C
ESTIMATED AVERAGE GLUCOSE: 131 mg/dL
Hgb A1c MFr Bld: 6.2 % — ABNORMAL HIGH (ref 4.8–5.6)

## 2015-09-14 ENCOUNTER — Ambulatory Visit (INDEPENDENT_AMBULATORY_CARE_PROVIDER_SITE_OTHER): Payer: Medicare Other | Admitting: Internal Medicine

## 2015-09-14 ENCOUNTER — Encounter: Payer: Self-pay | Admitting: Internal Medicine

## 2015-09-14 VITALS — BP 112/80 | HR 60 | Temp 97.7°F | Resp 20 | Ht 61.0 in | Wt 231.4 lb

## 2015-09-14 DIAGNOSIS — N181 Chronic kidney disease, stage 1: Secondary | ICD-10-CM

## 2015-09-14 DIAGNOSIS — I10 Essential (primary) hypertension: Secondary | ICD-10-CM

## 2015-09-14 DIAGNOSIS — E669 Obesity, unspecified: Secondary | ICD-10-CM | POA: Diagnosis not present

## 2015-09-14 DIAGNOSIS — K648 Other hemorrhoids: Secondary | ICD-10-CM

## 2015-09-14 DIAGNOSIS — E1122 Type 2 diabetes mellitus with diabetic chronic kidney disease: Secondary | ICD-10-CM | POA: Diagnosis not present

## 2015-09-14 DIAGNOSIS — K644 Residual hemorrhoidal skin tags: Secondary | ICD-10-CM

## 2015-09-14 NOTE — Progress Notes (Signed)
Patient ID: Danielle Pierce, female   DOB: 07-03-1941, 74 y.o.   MRN: [id number]    Facility  [facility]    Place of Service:   OFFICE    No Known Allergies  Chief Complaint  Patient presents with  . Medical Management of Chronic Issues    4 month follow-up for Hypertension, DM    HPI:  Type 2 diabetes mellitus with stage 1 chronic kidney disease, without long-term current use of insulin ([facility]) - controlled  Essential hypertension - controlled  Obesity - noncompliant with diet. No weight loss.  Internal and external bleeding hemorrhoids - no further bleeding since September 2016.    Medications: Patient's Medications  New Prescriptions   No medications on file  Previous Medications   ANUCORT-HC 25 MG SUPPOSITORY    INSERT 1 SUPPOSITORY PER RECTUM ONCE DAILY FOR HEMORRHOIDS.   ASPIRIN 81 MG TABLET    Take 81 mg by mouth daily. Take 1 tablet once a day.   CALCIUM CARBONATE-VITAMIN D (CALCIUM-VITAMIN D) 500-200 MG-UNIT PER TABLET    Take 1 tablet by mouth 2 (two) times daily. Take 1 tablet twice a day to help bones.   CARBOXYMETHYLCELL-HYPROMELLOSE (GENTEAL) 0.25-0.3 % GEL    Apply 1 application to eye daily. Use 1 application to each eye as needed to alleviate irritation.   CHOLECALCIFEROL (VITAMIN D-3 PO)    Take 1 tablet by mouth daily.    HYDROCORTISONE (ANUCORT-HC) 25 MG SUPPOSITORY    Insert 1 suppository rectally once daily for Hemorrhoids.   IBUPROFEN (ADVIL,MOTRIN) 800 MG TABLET    TAKE 1 TABLET UP TO FOUR TIMES DAILY AS NEEDED FOR PAIN.   LOSARTAN-HYDROCHLOROTHIAZIDE (HYZAAR) 50-12.5 MG PER TABLET    TAKE 1 TABLET DAILY FOR BLOOD PRESSURE.   TDAP (BOOSTRIX) 5-2.5-18.5 LF-MCG/0.5 INJECTION    Inject 0.5 mLs into the muscle once.   VITAMIN B-12 (CYANOCOBALAMIN) 1000 MCG TABLET    Take 1,000 mcg by mouth daily. Take 1 tablet once a day.   VITAMIN C (ASCORBIC ACID) 500 MG TABLET    Take 500 mg by mouth daily. Take 1 tablet once a day.   VITAMIN E 400 UNIT CAPSULE    Take 400  Units by mouth daily. Take 1 capsule once a day.  Modified Medications   No medications on file  Discontinued Medications   No medications on file    Review of Systems  Constitutional: Negative.  Negative for activity change, appetite change and unexpected weight change.  HENT: Positive for hearing loss.   Eyes: Negative.   Respiratory: Negative.   Cardiovascular: Negative for chest pain, palpitations and leg swelling.  Gastrointestinal: Positive for blood in stool. Negative for nausea, abdominal pain, diarrhea, abdominal distention and rectal pain.        Hematochezia 03/07/13. No abdominal pain. Mild discomfort in lower back. Colonoscopy done October 2014 due to a history of adenomatous polyp and family history. Sees Dr. Carlean Purl.  Endocrine:       Diabetic. Mild increase in creatinine.  Genitourinary: Negative.   Musculoskeletal:       Bilateral mild shoulder pains. S/P right shoulder surgery.  Skin:       Painful corns on feet  Allergic/Immunologic: Negative.   Neurological: Negative.   Hematological: Negative.   Psychiatric/Behavioral: Negative.     Filed Vitals:   09/14/15 1349  BP: 112/80  Pulse: 60  Temp: 97.7 F (36.5 C)  TempSrc: Oral  Resp: 20  Height: $Remove'5\' 1"'tDrVNNm$  (1.549 m)  Weight: 231  lb 6.4 oz (104.962 kg)  SpO2: 98%   Body mass index is 43.75 kg/(m^2).  Physical Exam  Constitutional: She is oriented to person, place, and time.  Obese.  HENT:  Head: Normocephalic and atraumatic.  Right Ear: External ear normal.  Left Ear: External ear normal.  Nose: Nose normal.  Mouth/Throat: Oropharynx is clear and moist.  Eyes: Conjunctivae and EOM are normal. Pupils are equal, round, and reactive to light.  Neck: No JVD present. No tracheal deviation present. No thyromegaly present.  Cardiovascular: Normal rate, regular rhythm, normal heart sounds and intact distal pulses.   Pulmonary/Chest: No respiratory distress. She has no wheezes. She has no rales. She exhibits no  tenderness.  Abdominal: Bowel sounds are normal. She exhibits no distension and no mass. There is no tenderness.  Musculoskeletal: Normal range of motion. She exhibits no edema or tenderness.  No focal spots of pain in back or pelvis on palpation Scar right shoulder from surgery 07/02/2014.  Lymphadenopathy:    She has no cervical adenopathy.  Neurological: She is alert and oriented to person, place, and time. She has normal reflexes. No cranial nerve deficit.  Intact vibratory sensation.  Skin: No rash noted. No erythema. No pallor.  Painful corns on feet  Psychiatric: She has a normal mood and affect. Her behavior is normal. Judgment and thought content normal.    Labs reviewed: Lab Summary Latest Ref Rng 09/10/2015 05/14/2015 01/04/2015 08/10/2014  Hemoglobin 13.0-17.0 g/dL (None) (None) (None) (None)  Hematocrit 39.0-52.0 % (None) (None) (None) (None)  White count - (None) (None) (None) (None)  Platelet count - (None) (None) (None) (None)  Sodium 136 - 144 mmol/L 142 142 141 143  Potassium 3.5 - 5.2 mmol/L 4.1 3.9 4.0 4.0  Calcium 8.7 - 10.3 mg/dL 9.2 9.1 9.2 9.6  Phosphorus - (None) (None) (None) (None)  Creatinine 0.57 - 1.00 mg/dL 1.07(H) 0.94 1.06(H) 1.07(H)  AST 0 - 40 IU/L 27 (None) 24 24  Alk Phos 39 - 117 IU/L 79 (None) 90 90  Bilirubin 0.0 - 1.2 mg/dL 0.4 (None) 0.6 0.4  Glucose 65 - 99 mg/dL 96 101(H) 111(H) 106(H)  Cholesterol - (None) (None) (None) (None)  HDL cholesterol - (None) (None) (None) (None)  Triglycerides - (None) (None) (None) (None)  LDL Direct - (None) (None) (None) (None)  LDL Calc - (None) (None) (None) (None)  Total protein - (None) (None) (None) (None)  Albumin 3.5 - 4.8 g/dL 4.2 (None) 4.3 4.4   No results found for: TSH, T3TOTAL, T4TOTAL, THYROIDAB Lab Results  Component Value Date   BUN 18 09/10/2015   Lab Results  Component Value Date   HGBA1C 6.2* 09/10/2015    Assessment/Plan  1. Type 2 diabetes mellitus with stage 1 chronic kidney  disease, without long-term current use of insulin ([facility]) - Hemoglobin A1c; Future - Basic metabolic panel; Future - Microalbumin, urine; Future - Lipid panel; Future  2. Essential hypertension - Basic metabolic panel; Future  3. Obesity - Advised to follow diet and do the best she can with weight loss.   4. Internal and external bleeding hemorrhoids Asymptomatic at present

## 2015-09-29 ENCOUNTER — Other Ambulatory Visit: Payer: Self-pay | Admitting: Internal Medicine

## 2015-10-19 ENCOUNTER — Other Ambulatory Visit: Payer: Self-pay | Admitting: Internal Medicine

## 2015-11-09 ENCOUNTER — Encounter: Payer: Self-pay | Admitting: Internal Medicine

## 2015-11-16 ENCOUNTER — Other Ambulatory Visit: Payer: Self-pay | Admitting: *Deleted

## 2015-11-16 DIAGNOSIS — K644 Residual hemorrhoidal skin tags: Secondary | ICD-10-CM

## 2015-11-16 DIAGNOSIS — K648 Other hemorrhoids: Principal | ICD-10-CM

## 2015-11-16 MED ORDER — HYDROCORTISONE ACETATE 25 MG RE SUPP: RECTAL | Status: DC

## 2015-11-16 NOTE — Telephone Encounter (Signed)
[facility]  

## 2015-12-06 ENCOUNTER — Emergency Department (INDEPENDENT_AMBULATORY_CARE_PROVIDER_SITE_OTHER)
Admission: EM | Admit: 2015-12-06 | Discharge: 2015-12-06 | Disposition: A | Discharge status: Home / Self Care | Payer: Medicare Other | Source: Home / Self Care | Attending: Family Medicine | Admitting: Family Medicine

## 2015-12-06 ENCOUNTER — Emergency Department (INDEPENDENT_AMBULATORY_CARE_PROVIDER_SITE_OTHER): Payer: Medicare Other

## 2015-12-06 ENCOUNTER — Encounter (HOSPITAL_COMMUNITY): Payer: Self-pay | Admitting: Emergency Medicine

## 2015-12-06 DIAGNOSIS — M25511 Pain in right shoulder: Secondary | ICD-10-CM

## 2015-12-06 DIAGNOSIS — M25552 Pain in left hip: Secondary | ICD-10-CM
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
## 2015-12-06 NOTE — Discharge Instructions (Signed)
Heat Therapy  Heat therapy can help ease sore, stiff, injured, and tight muscles and joints. Heat relaxes your muscles, which may help ease your pain.   RISKS AND COMPLICATIONS  If you have any of the following conditions, do not use heat therapy unless your health care provider has approved:   Poor circulation.   Healing wounds or scarred skin in the area being treated.   Diabetes, heart disease, or high blood pressure.   Not being able to feel (numbness) the area being treated.   Unusual swelling of the area being treated.   Active infections.   Blood clots.   Cancer.   Inability to communicate pain. This may include young children and people who have problems with their brain function (dementia).   Pregnancy.  Heat therapy should only be used on old, pre-existing, or long-lasting (chronic) injuries. Do not use heat therapy on new injuries unless directed by your health care provider.  HOW TO USE HEAT THERAPY  There are several different kinds of heat therapy, including:   Moist heat pack.   Warm water bath.   Hot water bottle.   Electric heating pad.   Heated gel pack.   Heated wrap.   Electric heating pad.  Use the heat therapy method suggested by your health care provider. Follow your health care provider's instructions on when and how to use heat therapy.  GENERAL HEAT THERAPY RECOMMENDATIONS   Do not sleep while using heat therapy. Only use heat therapy while you are awake.   Your skin may turn pink while using heat therapy. Do not use heat therapy if your skin turns red.   Do not use heat therapy if you have new pain.   High heat or long exposure to heat can cause burns. Be careful when using heat therapy to avoid burning your skin.   Do not use heat therapy on areas of your skin that are already irritated, such as with a rash or sunburn.  SEEK MEDICAL CARE IF:   You have blisters, redness, swelling, or numbness.   You have new pain.   Your pain is worse.  MAKE SURE  YOU:   Understand these instructions.   Will watch your condition.   Will get help right away if you are not doing well or get worse.     This information is not intended to replace advice given to you by your health care provider. Make sure you discuss any questions you have with your health care provider.     Document Released: 12/11/2011 Document Revised: 10/09/2014 Document Reviewed: 11/11/2013  Elsevier Interactive Patient Education 2016 [employer]

## 2015-12-15 ENCOUNTER — Encounter: Payer: Self-pay | Admitting: Internal Medicine

## 2015-12-15 ENCOUNTER — Ambulatory Visit (INDEPENDENT_AMBULATORY_CARE_PROVIDER_SITE_OTHER): Payer: Medicare Other | Admitting: Internal Medicine

## 2015-12-15 VITALS — BP 154/86 | HR 62 | Temp 97.5°F | Ht 61.0 in | Wt 240.0 lb

## 2015-12-15 DIAGNOSIS — I10 Essential (primary) hypertension: Secondary | ICD-10-CM

## 2015-12-15 DIAGNOSIS — M729 Fibroblastic disorder, unspecified: Secondary | ICD-10-CM | POA: Insufficient documentation
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
## 2015-12-15 NOTE — Patient Instructions (Signed)
Take Aleve with breakfast, lunch, and supper for pain relief.

## 2015-12-17 ENCOUNTER — Ambulatory Visit
Admission: RE | Admit: 2015-12-17 | Discharge: 2015-12-17 | Disposition: A | Discharge status: Home / Self Care | Payer: Self-pay | Source: Ambulatory Visit | Attending: Internal Medicine | Admitting: Internal Medicine

## 2015-12-17 DIAGNOSIS — M729 Fibroblastic disorder, unspecified: Secondary | ICD-10-CM

## 2016-01-17 ENCOUNTER — Other Ambulatory Visit: Payer: Medicare Other

## 2016-01-17 DIAGNOSIS — I10 Essential (primary) hypertension: Secondary | ICD-10-CM

## 2016-01-17 DIAGNOSIS — E669 Obesity, unspecified: Secondary | ICD-10-CM

## 2016-01-17 DIAGNOSIS — E1122 Type 2 diabetes mellitus with diabetic chronic kidney disease: Secondary | ICD-10-CM

## 2016-01-17 DIAGNOSIS — N181 Chronic kidney disease, stage 1: Principal | ICD-10-CM

## 2016-01-18 LAB — BASIC METABOLIC PANEL
BUN/Creatinine Ratio: 16 (ref 12–28)
BUN: 14 mg/dL (ref 8–27)
CALCIUM: 9 mg/dL (ref 8.7–10.3)
CO2: 24 mmol/L (ref 18–29)
Chloride: 105 mmol/L (ref 96–106)
Creatinine, Ser: 0.87 mg/dL (ref 0.57–1.00)
GFR calc non Af Amer: 66 mL/min/{1.73_m2} (ref >59)
GFR, EST AFRICAN AMERICAN: 76 mL/min/{1.73_m2} (ref >59)
Glucose: 106 mg/dL — ABNORMAL HIGH (ref 65–99)
POTASSIUM: 4.1 mmol/L (ref 3.5–5.2)
SODIUM: 145 mmol/L — AB (ref 134–144)

## 2016-01-18 LAB — MICROALBUMIN, URINE: Microalbumin, Urine: 54.5 ug/mL

## 2016-01-18 LAB — LIPID PANEL
CHOL/HDL RATIO: 3.2 ratio (ref 0.0–4.4)
CHOLESTEROL TOTAL: 186 mg/dL (ref 100–199)
HDL: 58 mg/dL (ref >39)
LDL Calculated: 116 mg/dL — ABNORMAL HIGH (ref 0–99)
Triglycerides: 60 mg/dL (ref 0–149)
VLDL Cholesterol Cal: 12 mg/dL (ref 5–40)

## 2016-01-18 LAB — HEMOGLOBIN A1C
ESTIMATED AVERAGE GLUCOSE: 137 mg/dL
HEMOGLOBIN A1C: 6.4 % — AB (ref 4.8–5.6)

## 2016-01-19 ENCOUNTER — Encounter: Payer: Self-pay | Admitting: Internal Medicine

## 2016-01-19 ENCOUNTER — Ambulatory Visit (INDEPENDENT_AMBULATORY_CARE_PROVIDER_SITE_OTHER): Payer: Medicare Other | Admitting: Internal Medicine

## 2016-01-19 VITALS — BP 142/82 | HR 57 | Temp 98.0°F | Ht 61.0 in | Wt 235.0 lb

## 2016-01-19 DIAGNOSIS — M545 Low back pain: Secondary | ICD-10-CM

## 2016-01-19 DIAGNOSIS — E1122 Type 2 diabetes mellitus with diabetic chronic kidney disease: Secondary | ICD-10-CM

## 2016-01-19 DIAGNOSIS — E785 Hyperlipidemia, unspecified: Secondary | ICD-10-CM

## 2016-01-19 DIAGNOSIS — I10 Essential (primary) hypertension: Secondary | ICD-10-CM | POA: Diagnosis not present

## 2016-01-19 DIAGNOSIS — G8929 Other chronic pain: Secondary | ICD-10-CM | POA: Diagnosis not present

## 2016-01-19 DIAGNOSIS — N181 Chronic kidney disease, stage 1: Secondary | ICD-10-CM | POA: Diagnosis not present

## 2016-01-19 DIAGNOSIS — K648 Other hemorrhoids: Secondary | ICD-10-CM

## 2016-01-19 DIAGNOSIS — K644 Residual hemorrhoidal skin tags: Secondary | ICD-10-CM | POA: Diagnosis not present

## 2016-01-19 DIAGNOSIS — E669 Obesity, unspecified: Secondary | ICD-10-CM

## 2016-01-19 DIAGNOSIS — E559 Vitamin D deficiency, unspecified: Secondary | ICD-10-CM | POA: Diagnosis not present

## 2016-01-19 MED ORDER — IBUPROFEN 800 MG PO TABS: ORAL_TABLET | ORAL | Status: DC

## 2016-01-19 MED ORDER — LOSARTAN POTASSIUM-HCTZ 50-12.5 MG PO TABS: ORAL_TABLET | ORAL | Status: DC

## 2016-01-19 MED ORDER — HYDROCORTISONE ACETATE 25 MG RE SUPP: RECTAL | Status: DC

## 2016-01-19 NOTE — Progress Notes (Signed)
Patient ID: Danielle Pierce, female   DOB: [birth year], 75 y.o.   MRN: [id number]    HISTORY AND PHYSICAL  Location:    [facility]   Place of Service:   OFFICE  Extended Emergency Contact Information Primary Emergency Contact: Wisnewski,Luther Address: [address]          [place], [place] [address] [place] Phone: [phone] Mobile Phone: [phone] Relation: Spouse Secondary Emergency Contact: Magnus Sinning [place] Phone: [phone] Mobile Phone: [phone] Relation: Daughter  Advanced Directive information Does patient have an advance directive?: No  Chief Complaint  Patient presents with  . Medical Management of Chronic Issues    4 month OV blood pressure, blood sugar, review labs    HPI:  Type 2 diabetes mellitus with stage 1 chronic kidney disease, without long-term current use of insulin (HCC) - controlled  Essential hypertension -- controlled  Obesity - walking more. Watching diet closer.Lost 5 pounds since last visit.  Vitamin D deficiency - continues supplements  Hemorrhoids - itching but no bleeding. Suppositories continue to help. She uses them intermittently..   Lumbago - good control. Uses ibuprofen 800 mg when necessary. Continues to have occasional low back pain.  Past Medical History  Diagnosis Date  . GERD (gastroesophageal reflux disease)   . Unspecified vitamin D deficiency   . Hypertension   . Fibrocystic breast   . Hiatal hernia   . Arthritis     Ankle  . Obesity, unspecified   . Other diseases of nasal cavity and sinuses(478.19)   . Impacted cerumen 11/07/2011  . Cervicitis and endocervicitis 09/13/2011  . Pain in joint, pelvic region and thigh   . Pain in joint, ankle and foot   . Disorder of bone and cartilage, unspecified   . Pain in joint, shoulder region   . Benign neoplasm of colon   . Reflux esophagitis   . External hemorrhoids without mention of complication   . Unspecified  essential hypertension   . Asymptomatic varicose veins   . Generalized osteoarthrosis, unspecified site   . Disorders of bursae and tendons in shoulder region, unspecified   . Bilateral shoulder pain 07/02/14    Past Surgical History  Procedure Laterality Date  . Mouth surgery  2005    DR LUTINS  . Flexible sigmoidoscopy  1990    Hemorrhoids   . Colonoscopy  2006    Dr.Orr  . Colonoscopy  07/08/2013    Dr. Carlean Purl  . Shoulder arthroscopy w/ acromial repair Right 07/02/14    Dr. Durward Fortes    Patient Care Team: Estill Dooms, MD as PCP - General (Internal Medicine) Garald Balding, MD as Consulting Physician (Orthopedic Surgery) Rutherford Guys, MD as Consulting Physician (Ophthalmology) Druscilla Brownie, MD as Consulting Physician (Dermatology) Gatha Mayer, MD as Consulting Physician (Gastroenterology)  Social History   Social History  . Marital Status: Married    Spouse Name: N/A  . Number of Children: N/A  . Years of Education: N/A   Occupational History  . Not on file.   Social History Main Topics  . Smoking status: Never Smoker   . Smokeless tobacco: Never Used  . Alcohol Use: No  . Drug Use: No  . Sexual Activity: Not on file   Other Topics Concern  . Not on file   Social History Narrative    reports that she has never smoked. She has never used smokeless tobacco. She reports that she does not drink alcohol or  use illicit drugs.  Family History  Problem Relation Age of Onset  . Hypertension Mother   . Kidney disease Mother     Renal failure  . Cancer Brother     Lymphoma  . ADD / ADHD Son   . Seizures Son   . Hypertension Sister   . Hypertension Sister   . Hypertension Sister   . Early death Brother     Some type of accident  . Colon cancer Father 22   Family Status  Relation Status Death Age  . Mother Deceased     Cause of Death: HTN & Renal failure  . Brother Deceased     Cause of Death: Lymphoma  . Son Alive   . Sister Alive   .  Sister Alive   . Sister Alive   . Brother Deceased     Cause of Death: Accidental  . Father Deceased     Cause of Death: Colon cancer  . Sister Deceased in 28-Feb-2009    Cause of Death: Unknown  . Daughter Alive   . Brother Biomedical engineer History  Administered Date(s) Administered  . PPD Test 10/02/1997  . Td 04/30/1998    No Known Allergies  Medications: Patient's Medications  New Prescriptions   No medications on file  Previous Medications   ASPIRIN 81 MG TABLET    Take 81 mg by mouth daily. Take 1 tablet once a day.   CALCIUM CARBONATE-VITAMIN D (CALCIUM-VITAMIN D) 500-200 MG-UNIT PER TABLET    Take 1 tablet by mouth 2 (two) times daily. Take 1 tablet twice a day to help bones.   CARBOXYMETHYLCELL-HYPROMELLOSE (GENTEAL) 0.25-0.3 % GEL    Apply 1 application to eye daily. Use 1 application to each eye as needed to alleviate irritation.   CHOLECALCIFEROL (VITAMIN D-3 PO)    Take 1 tablet by mouth daily.    HYDROCORTISONE (ANUCORT-HC) 25 MG SUPPOSITORY    Insert one suppository per rectum once daily for hemorrhoids   IBUPROFEN (ADVIL,MOTRIN) 800 MG TABLET    TAKE 1 TABLET UP TO FOUR TIMES DAILY AS NEEDED FOR PAIN.   LOSARTAN-HYDROCHLOROTHIAZIDE (HYZAAR) 50-12.5 MG TABLET    TAKE 1 TABLET DAILY FOR BLOOD PRESSURE.   VITAMIN B-12 (CYANOCOBALAMIN) 1000 MCG TABLET    Take 1,000 mcg by mouth daily. Take 1 tablet once a day.   VITAMIN C (ASCORBIC ACID) 500 MG TABLET    Take 500 mg by mouth daily. Take 1 tablet once a day.   VITAMIN E 400 UNIT CAPSULE    Take 400 Units by mouth daily. Take 1 capsule once a day.  Modified Medications   No medications on file  Discontinued Medications   No medications on file    Review of Systems  Constitutional: Negative.  Negative for fever, chills, diaphoresis, activity change, appetite change, fatigue and unexpected weight change.  HENT: Positive for hearing loss. Negative for congestion, ear discharge, ear pain, postnasal drip, rhinorrhea,  sore throat, tinnitus, trouble swallowing and voice change.   Eyes: Negative.  Negative for pain, redness, itching and visual disturbance.  Respiratory: Negative.  Negative for cough, choking, shortness of breath and wheezing.   Cardiovascular: Negative for chest pain, palpitations and leg swelling.  Gastrointestinal: Positive for blood in stool. Negative for nausea, abdominal pain, diarrhea, constipation, abdominal distention and rectal pain.        Hematochezia 03/07/13. No abdominal pain. Mild discomfort in lower back. Colonoscopy done October 2014 due to a history of  adenomatous polyp and family history. Sees Dr. Carlean Purl. Hemorrhoids itch from time to time  Endocrine: Negative for cold intolerance, heat intolerance, polydipsia, polyphagia and polyuria.       Diabetic. Mild increase in creatinine.  Genitourinary: Negative.  Negative for dysuria, urgency, frequency, hematuria, flank pain, vaginal discharge, difficulty urinating and pelvic pain.  Musculoskeletal: Negative for myalgias, back pain, arthralgias, gait problem, neck pain and neck stiffness.       Bilateral mild shoulder pains. S/P right shoulder surgery. Tender plantar fascia bilaterally.  Skin: Negative for color change, pallor and rash.       Prior painful corns on feet have resolved.  Allergic/Immunologic: Negative.   Neurological: Negative.  Negative for dizziness, tremors, seizures, syncope, weakness, numbness and headaches.  Hematological: Negative.  Negative for adenopathy. Does not bruise/bleed easily.  Psychiatric/Behavioral: Negative.  Negative for suicidal ideas, hallucinations, behavioral problems, confusion, sleep disturbance, dysphoric mood and agitation. The patient is not nervous/anxious and is not hyperactive.     Filed Vitals:   01/19/16 1146  BP: 142/82  Pulse: 57  Temp: 98 F (36.7 C)  TempSrc: Oral  Height: '5\' 1"'$  (1.549 m)  Weight: 235 lb (106.595 kg)  SpO2: 96%   Body mass index is 44.43  kg/(m^2). Filed Weights   01/19/16 1146  Weight: 235 lb (106.595 kg)     Physical Exam  Constitutional: She is oriented to person, place, and time.  Obese.  HENT:  Head: Normocephalic and atraumatic.  Right Ear: External ear normal.  Left Ear: External ear normal.  Nose: Nose normal.  Mouth/Throat: Oropharynx is clear and moist.  Eyes: Conjunctivae and EOM are normal. Pupils are equal, round, and reactive to light.  Neck: No JVD present. No tracheal deviation present. No thyromegaly present.  Cardiovascular: Normal rate, regular rhythm, normal heart sounds and intact distal pulses.   Pulmonary/Chest: No respiratory distress. She has no wheezes. She has no rales. She exhibits no tenderness.  Abdominal: Bowel sounds are normal. She exhibits no distension and no mass. There is no tenderness.  Musculoskeletal: Normal range of motion. She exhibits no edema or tenderness.  No focal spots of pain in back or pelvis on palpation Scar right shoulder from surgery 07/02/2014. Tender plantar fascia bilaterally.  Lymphadenopathy:    She has no cervical adenopathy.  Neurological: She is alert and oriented to person, place, and time. She has normal reflexes. No cranial nerve deficit.  Intact vibratory sensation.  Skin: No rash noted. No erythema. No pallor.  Painful corns on feet  Psychiatric: She has a normal mood and affect. Her behavior is normal. Judgment and thought content normal.    Labs reviewed: Lab Summary Latest Ref Rng 01/17/2016 09/10/2015 05/14/2015 01/04/2015  Hemoglobin 13.0-17.0 g/dL (None) (None) (None) (None)  Hematocrit 39.0-52.0 % (None) (None) (None) (None)  White count - (None) (None) (None) (None)  Platelet count - (None) (None) (None) (None)  Sodium 134 - 144 mmol/L 145(H) 142 142 141  Potassium 3.5 - 5.2 mmol/L 4.1 4.1 3.9 4.0  Calcium 8.7 - 10.3 mg/dL 9.0 9.2 9.1 9.2  Phosphorus - (None) (None) (None) (None)  Creatinine 0.57 - 1.00 mg/dL 0.87 1.07(H) 0.94 1.06(H)   AST 0 - 40 IU/L (None) 27 (None) 24  Alk Phos 39 - 117 IU/L (None) 79 (None) 90  Bilirubin 0.0 - 1.2 mg/dL (None) 0.4 (None) 0.6  Glucose 65 - 99 mg/dL 106(H) 96 101(H) 111(H)  Cholesterol - (None) (None) (None) (None)  HDL cholesterol >39 mg/dL 58 (None) (  None) (None)  Triglycerides 0 - 149 mg/dL 60 (None) (None) (None)  LDL Direct - (None) (None) (None) (None)  LDL Calc 0 - 99 mg/dL 116(H) (None) (None) (None)  Total protein - (None) (None) (None) (None)  Albumin 3.5 - 4.8 g/dL (None) 4.2 (None) 4.3   Lab Results  Component Value Date   BUN 14 01/17/2016   Lab Results  Component Value Date   HGBA1C 6.4* 01/17/2016    Assessment/Plan  1. Type 2 diabetes mellitus with stage 1 chronic kidney disease, without long-term current use of insulin (HCC) - Hemoglobin A1c; Future - Basic metabolic panel; Future  2. Essential hypertension - Basic metabolic panel; Future - losartan-hydrochlorothiazide (HYZAAR) 50-12.5 MG tablet; Take one daily to control blood pressure  Dispense: 90 tablet; Refill: 3  3. Obesity Encouraged continued weight loss, exercise, and dietary monitoring.  4. Vitamin D deficiency Continue supplement  5. HLD (hyperlipidemia) - Lipid panel; Future  6. Internal and external bleeding hemorrhoids - hydrocortisone (ANUCORT-HC) 25 MG suppository; Insert one suppository per rectum once daily as needed for control of hemorrhoids  Dispense: 30 suppository; Refill: 3  7. Chronic midline low back pain without sciatica - ibuprofen (ADVIL,MOTRIN) 800 MG tablet; Take one up to 4 times daily as needed for pain  Dispense: 120 tablet; Refill: 5

## 2016-03-27 LAB — HM MAMMOGRAPHY

## 2016-03-28 ENCOUNTER — Other Ambulatory Visit: Payer: Self-pay | Admitting: *Deleted

## 2016-05-10 NOTE — Addendum Note (Signed)
Addended by: Logan Bores on: 05/10/2016 02:52 PM   Modules accepted: Orders

## 2016-05-15 ENCOUNTER — Other Ambulatory Visit: Payer: Medicare Other

## 2016-05-15 DIAGNOSIS — E785 Hyperlipidemia, unspecified: Secondary | ICD-10-CM

## 2016-05-15 DIAGNOSIS — N181 Chronic kidney disease, stage 1: Principal | ICD-10-CM

## 2016-05-15 DIAGNOSIS — I10 Essential (primary) hypertension: Secondary | ICD-10-CM

## 2016-05-15 DIAGNOSIS — E1122 Type 2 diabetes mellitus with diabetic chronic kidney disease: Secondary | ICD-10-CM

## 2016-05-15 LAB — LIPID PANEL
CHOL/HDL RATIO: 3 ratio (ref <=5.0)
CHOLESTEROL: 185 mg/dL (ref 125–200)
HDL: 61 mg/dL (ref >=46)
LDL CALC: 115 mg/dL (ref <130)
TRIGLYCERIDES: 46 mg/dL (ref <150)
VLDL: 9 mg/dL (ref <30)

## 2016-05-15 LAB — BASIC METABOLIC PANEL
BUN: 15 mg/dL (ref 7–25)
CHLORIDE: 109 mmol/L (ref 98–110)
CO2: 23 mmol/L (ref 20–31)
Calcium: 8.9 mg/dL (ref 8.6–10.4)
Creat: 0.98 mg/dL — ABNORMAL HIGH (ref 0.60–0.93)
GLUCOSE: 98 mg/dL (ref 65–99)
POTASSIUM: 3.9 mmol/L (ref 3.5–5.3)
SODIUM: 143 mmol/L (ref 135–146)

## 2016-05-16 LAB — HEMOGLOBIN A1C
Hgb A1c MFr Bld: 5.9 % — ABNORMAL HIGH (ref <5.7)
Mean Plasma Glucose: 123 mg/dL

## 2016-05-17 ENCOUNTER — Ambulatory Visit (INDEPENDENT_AMBULATORY_CARE_PROVIDER_SITE_OTHER): Payer: Medicare Other | Admitting: Internal Medicine

## 2016-05-17 ENCOUNTER — Encounter: Payer: Self-pay | Admitting: Internal Medicine

## 2016-05-17 VITALS — BP 120/70 | HR 80 | Temp 97.6°F | Resp 18 | Ht 61.0 in | Wt 238.8 lb

## 2016-05-17 DIAGNOSIS — E669 Obesity, unspecified: Secondary | ICD-10-CM

## 2016-05-17 DIAGNOSIS — N181 Chronic kidney disease, stage 1: Secondary | ICD-10-CM | POA: Diagnosis not present

## 2016-05-17 DIAGNOSIS — E1122 Type 2 diabetes mellitus with diabetic chronic kidney disease: Secondary | ICD-10-CM | POA: Diagnosis not present

## 2016-05-17 DIAGNOSIS — I10 Essential (primary) hypertension: Secondary | ICD-10-CM | POA: Diagnosis not present
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
## 2016-06-20 LAB — HM DEXA SCAN

## 2016-07-13 ENCOUNTER — Encounter (HOSPITAL_COMMUNITY): Payer: Self-pay | Admitting: Emergency Medicine

## 2016-07-13 ENCOUNTER — Ambulatory Visit (INDEPENDENT_AMBULATORY_CARE_PROVIDER_SITE_OTHER): Payer: Medicare Other

## 2016-07-13 ENCOUNTER — Ambulatory Visit (HOSPITAL_COMMUNITY)
Admission: EM | Admit: 2016-07-13 | Discharge: 2016-07-13 | Disposition: A | Discharge status: Home / Self Care | Payer: Medicare Other | Attending: Internal Medicine | Admitting: Internal Medicine

## 2016-07-13 DIAGNOSIS — S9032XA Contusion of left foot, initial encounter: Secondary | ICD-10-CM

## 2016-07-13 NOTE — ED Provider Notes (Signed)
CSN: [id number]     Arrival date & time 07/13/16  1530 History   None    Chief Complaint  Patient presents with  . Toe Injury    left 2-4   (Consider location/radiation/quality/duration/timing/severity/associated sxs/prior Treatment) The history is provided by the patient. No language interpreter was used.  Foot Injury  Location:  Foot Time since incident:  2 days Injury: no   Foot location:  L foot Pain details:    Quality:  Aching   Radiates to:  Does not radiate   Severity:  Moderate   Timing:  Constant   Progression:  Worsening Chronicity:  New Foreign body present:  No foreign bodies Prior injury to area:  No Relieved by:  Nothing Worsened by:  Nothing Ineffective treatments:  None tried Associated symptoms: no decreased ROM and no swelling   Risk factors: no concern for non-accidental trauma   Pt complains of pain in her left foot after dropping a chair on her foot.  Pt complains soreness in his toes   Past Medical History:  Diagnosis Date  . Arthritis    Ankle  . Asymptomatic varicose veins   . Benign neoplasm of colon   . Bilateral shoulder pain 07/02/14  . Cervicitis and endocervicitis 09/13/2011  . Disorder of bone and cartilage, unspecified   . Disorders of bursae and tendons in shoulder region, unspecified   . External hemorrhoids without mention of complication   . Fibrocystic breast   . Generalized osteoarthrosis, unspecified site   . GERD (gastroesophageal reflux disease)   . Hiatal hernia   . Hypertension   . Impacted cerumen 11/07/2011  . Obesity, unspecified   . Other diseases of nasal cavity and sinuses(478.19)   . Pain in joint, ankle and foot   . Pain in joint, pelvic region and thigh   . Pain in joint, shoulder region   . Reflux esophagitis   . Unspecified essential hypertension   . Unspecified vitamin D deficiency    Past Surgical History:  Procedure Laterality Date  . COLONOSCOPY  2006   Dr.Orr  . COLONOSCOPY  07/08/2013   Dr.  Carlean Purl  . [place]   Hemorrhoids   . MOUTH SURGERY  2005   DR LUTINS  . SHOULDER ARTHROSCOPY W/ ACROMIAL REPAIR Right 07/02/14   Dr. Durward Fortes   Family History  Problem Relation Age of Onset  . Hypertension Mother   . Kidney disease Mother     Renal failure  . Cancer Brother     Lymphoma  . ADD / ADHD Son   . Seizures Son   . Hypertension Sister   . Dementia Sister   . Hypertension Sister   . Hypertension Sister   . Cancer - Other Sister   . Early death Brother     Some type of accident  . Colon cancer Father 13   Social History  Substance Use Topics  . Smoking status: Never Smoker  . Smokeless tobacco: Never Used  . Alcohol use No   OB History    No data available     Review of Systems  All other systems reviewed and are negative.   Allergies  Review of patient's allergies indicates no known allergies.  Home Medications   Prior to Admission medications   Medication Sig Start Date End Date Taking? Authorizing Provider  losartan-hydrochlorothiazide [facility]) 50-12.5 MG tablet Take one daily to control blood pressure 01/19/16  Yes Estill Dooms, MD  aspirin 81 MG tablet Take  81 mg by mouth daily. Take 1 tablet once a day.    Historical Provider, MD  Calcium Carbonate-Vitamin D (CALCIUM-VITAMIN D) 500-200 MG-UNIT per tablet Take 1 tablet by mouth 2 (two) times daily. Take 1 tablet twice a day to help bones.    Historical Provider, MD  Carboxymethylcell-Hypromellose (GENTEAL) 0.25-0.3 % GEL Apply 1 application to eye daily. Use 1 application to each eye as needed to alleviate irritation.    Historical Provider, MD  Cholecalciferol (VITAMIN D-3 PO) Take 1 tablet by mouth daily.     Historical Provider, MD  hydrocortisone (ANUCORT-HC) 25 MG suppository Insert one suppository per rectum once daily as needed for control of hemorrhoids 01/19/16   Estill Dooms, MD  ibuprofen (ADVIL,MOTRIN) 800 MG tablet Take one up to 4 times daily as needed for pain  01/19/16   Estill Dooms, MD  vitamin B-12 (CYANOCOBALAMIN) 1000 MCG tablet Take 1,000 mcg by mouth daily. Take 1 tablet once a day.    Historical Provider, MD  vitamin C (ASCORBIC ACID) 500 MG tablet Take 500 mg by mouth daily. Take 1 tablet once a day.    Historical Provider, MD  vitamin E 400 UNIT capsule Take 400 Units by mouth daily. Take 1 capsule once a day.    Historical Provider, MD   Meds Ordered and Administered this Visit  Medications - No data to display  BP 134/77 (BP Location: Right Arm)   Pulse 76   Temp 97.8 F (36.6 C) (Oral)   SpO2 97%  No data found.   Physical Exam  Constitutional: She is oriented to person, place, and time. She appears well-developed and well-nourished.  Musculoskeletal: Normal range of motion.  Neurological: She is alert and oriented to person, place, and time. She has normal reflexes.  Skin: Skin is warm.  Psychiatric: She has a normal mood and affect.  Nursing note and vitals reviewed.   Urgent Care Course   Clinical Course    Procedures (including critical care time)  Labs Review Labs Reviewed - No data to display  Imaging Review Dg Foot Complete Left  Result Date: 07/13/2016 CLINICAL DATA:  Injury to the toes.  Pain. EXAM: LEFT FOOT - COMPLETE 3+ VIEW COMPARISON:  None. FINDINGS: The left foot demonstrates no fracture or dislocation. There is mild osteoarthritis of the first MTP joint. There is a small plantar calcaneal spur. There is no soft tissue abnormality. There is no subcutaneous emphysema or radiopaque foreign bodies. IMPRESSION: No acute osseous injury of the left foot. Electronically Signed   By: Kathreen Devoid   On: 07/13/2016 15:59     Visual Acuity Review  Right Eye Distance:   Left Eye Distance:   Bilateral Distance:    Right Eye Near:   Left Eye Near:    Bilateral Near:      Pt placed in ace wrap and post op shoe. Tylenol for pain   MDM   1. Contusion of left foot, initial encounter   An After Visit  Summary was printed and given to the patient.    [place], PA-C 07/13/16 1811

## 2016-07-13 NOTE — ED Triage Notes (Signed)
Pt repots dropping a folding chair on her left foot at the base of her three middle toes.  She has some pain and very little ROM and bruising.

## 2016-11-06 ENCOUNTER — Other Ambulatory Visit: Payer: Medicare Other

## 2016-11-06 ENCOUNTER — Other Ambulatory Visit: Payer: Self-pay

## 2016-11-06 DIAGNOSIS — E1122 Type 2 diabetes mellitus with diabetic chronic kidney disease: Secondary | ICD-10-CM

## 2016-11-06 DIAGNOSIS — I10 Essential (primary) hypertension: Secondary | ICD-10-CM

## 2016-11-06 DIAGNOSIS — N181 Chronic kidney disease, stage 1: Principal | ICD-10-CM

## 2016-11-06 LAB — COMPREHENSIVE METABOLIC PANEL
ALBUMIN: 3.8 g/dL (ref 3.6–5.1)
ALT: 28 U/L (ref 6–29)
AST: 42 U/L — ABNORMAL HIGH (ref 10–35)
Alkaline Phosphatase: 67 U/L (ref 33–130)
BUN: 20 mg/dL (ref 7–25)
CALCIUM: 9.1 mg/dL (ref 8.6–10.4)
CHLORIDE: 108 mmol/L (ref 98–110)
CO2: 25 mmol/L (ref 20–31)
Creat: 0.95 mg/dL — ABNORMAL HIGH (ref 0.60–0.93)
Glucose, Bld: 98 mg/dL (ref 65–99)
POTASSIUM: 4.1 mmol/L (ref 3.5–5.3)
Sodium: 143 mmol/L (ref 135–146)
TOTAL PROTEIN: 5.9 g/dL — AB (ref 6.1–8.1)
Total Bilirubin: 0.4 mg/dL (ref 0.2–1.2)

## 2016-11-07 LAB — HEMOGLOBIN A1C
Hgb A1c MFr Bld: 5.9 % — ABNORMAL HIGH (ref <5.7)
Mean Plasma Glucose: 123 mg/dL

## 2016-11-08 ENCOUNTER — Ambulatory Visit (INDEPENDENT_AMBULATORY_CARE_PROVIDER_SITE_OTHER): Payer: Medicare Other | Admitting: Internal Medicine

## 2016-11-08 ENCOUNTER — Encounter: Payer: Self-pay | Admitting: Internal Medicine

## 2016-11-08 VITALS — BP 132/72 | HR 76 | Temp 98.3°F | Ht 61.0 in | Wt 239.0 lb

## 2016-11-08 DIAGNOSIS — Z6841 Body Mass Index (BMI) 40.0 and over, adult: Secondary | ICD-10-CM

## 2016-11-08 DIAGNOSIS — Z124 Encounter for screening for malignant neoplasm of cervix: Secondary | ICD-10-CM

## 2016-11-08 DIAGNOSIS — E1122 Type 2 diabetes mellitus with diabetic chronic kidney disease: Secondary | ICD-10-CM | POA: Diagnosis not present

## 2016-11-08 DIAGNOSIS — N181 Chronic kidney disease, stage 1: Secondary | ICD-10-CM

## 2016-11-08 DIAGNOSIS — IMO0001 Reserved for inherently not codable concepts without codable children: Secondary | ICD-10-CM

## 2016-11-08 DIAGNOSIS — I10 Essential (primary) hypertension: Secondary | ICD-10-CM | POA: Diagnosis not present

## 2016-11-08 DIAGNOSIS — E6609 Other obesity due to excess calories: Secondary | ICD-10-CM

## 2016-11-08 DIAGNOSIS — N9089 Other specified noninflammatory disorders of vulva and perineum: Secondary | ICD-10-CM | POA: Insufficient documentation

## 2016-11-08 DIAGNOSIS — E785 Hyperlipidemia, unspecified: Secondary | ICD-10-CM | POA: Diagnosis not present

## 2016-11-08 NOTE — Progress Notes (Signed)
HISTORY AND PHYSICAL  Location:    [place]   Place of Service:   OFFICE  Extended Emergency Contact Information Primary Emergency Contact: Altidor,Luther Address: [address]          [place], [place] [address] [place] Phone: [phone] Mobile Phone: [phone] Relation: Spouse Secondary Emergency Contact: Magnus Sinning [place] Phone: [phone] Mobile Phone: [phone] Relation: Daughter  Advanced Directive information Does Patient Have a Medical Advance Directive?: No (talking with attorney)  Chief Complaint  Patient presents with  . Annual Exam    Physical  . Medical Management of Chronic Issues    medication management blood sugar, CKD, blood pressure, review labs.    HPI:  Essential hypertension - controlled  Type 2 diabetes mellitus with stage 1 chronic kidney disease, without long-term current use of insulin (HCC) - controlled  Class 3 obesity due to excess calories with serious comorbidity and body mass index (BMI) of 45.0 to 49.9 in adult ([place]) - no weight loss. Planninbg to join [employer].  Hyperlipidemia, unspecified hyperlipidemia type  Perineal bleed - reports a smaal amount of blleding in a single episode several weeks ago.  Past Medical History:  Diagnosis Date  . Arthritis    Ankle  . Asymptomatic varicose veins   . Benign neoplasm of colon   . Bilateral shoulder pain 07/02/14  . Cervicitis and endocervicitis 09/13/2011  . Disorder of bone and cartilage, unspecified   . Disorders of bursae and tendons in shoulder region, unspecified   . External hemorrhoids without mention of complication   . Fibrocystic breast   . Generalized osteoarthrosis, unspecified site   . GERD (gastroesophageal reflux disease)   . Hiatal hernia   . Hypertension   . Impacted cerumen 11/07/2011  . Obesity, unspecified   . Other diseases of nasal cavity and sinuses(478.19)   . Pain in joint, ankle and foot     . Pain in joint, pelvic region and thigh   . Pain in joint, shoulder region   . Reflux esophagitis   . Unspecified essential hypertension   . Unspecified vitamin D deficiency     Past Surgical History:  Procedure Laterality Date  . COLONOSCOPY  2006   Dr.Orr  . COLONOSCOPY  07/08/2013   Dr. Carlean Purl  . [place]   Hemorrhoids   . MOUTH SURGERY  2005   DR LUTINS  . SHOULDER ARTHROSCOPY W/ ACROMIAL REPAIR Right 07/02/14   Dr. Durward Fortes    Patient Care Team: Estill Dooms, MD as PCP - General (Internal Medicine) Garald Balding, MD as Consulting Physician (Orthopedic Surgery) Rutherford Guys, MD as Consulting Physician (Ophthalmology) Druscilla Brownie, MD as Consulting Physician (Dermatology) Gatha Mayer, MD as Consulting Physician (Gastroenterology)  Social History   Social History  . Marital status: Married    Spouse name: N/A  . Number of children: N/A  . Years of education: N/A   Occupational History  . retired Landscape architect    Social History Main Topics  . Smoking status: Never Smoker  . Smokeless tobacco: Never Used  . Alcohol use No  . Drug use: No  . Sexual activity: Not on file   Other Topics Concern  . Not on file   Social History Narrative   Married    Never smoked   Alcohlol none   Exercise treadmill 3 times a week        reports that she has never smoked.  She has never used smokeless tobacco. She reports that she does not drink alcohol or use drugs.  Family History  Problem Relation Age of Onset  . Hypertension Mother   . Kidney disease Mother     Renal failure  . Cancer Brother     Lymphoma  . ADD / ADHD Son   . Seizures Son   . Hypertension Sister   . Dementia Sister   . Hypertension Sister   . Hypertension Sister   . Cancer - Other Sister   . Early death Brother     Some type of accident  . Colon cancer Father 41   Family Status  Relation Status  . Mother Deceased   Cause of Death: HTN & Renal  failure  . Brother Deceased   Cause of Death: Lymphoma  . Son Alive  . Sister Deceased at age 2  . Sister Alive  . Sister Deceased at age 26  . Brother Deceased   Cause of Death: Accidental  . Father Deceased   Cause of Death: Colon cancer  . Sister Deceased at age in 11-23-2008   Cause of Death: Unknown  . Daughter Alive  . Brother Pension scheme manager History  Administered Date(s) Administered  . PPD Test 10/02/1997  . Td 04/30/1998    No Known Allergies  Medications: Patient's Medications  New Prescriptions   No medications on file  Previous Medications   ASPIRIN 81 MG TABLET    Take 81 mg by mouth daily. Take 1 tablet once a day.   CALCIUM CARBONATE-VITAMIN D (CALCIUM-VITAMIN D) 500-200 MG-UNIT PER TABLET    Take 1 tablet by mouth 2 (two) times daily. Take 1 tablet twice a day to help bones.   CARBOXYMETHYLCELL-HYPROMELLOSE (GENTEAL) 0.25-0.3 % GEL    Apply 1 application to eye daily. Use 1 application to each eye as needed to alleviate irritation.   CHOLECALCIFEROL (VITAMIN D-3 PO)    Take 1 tablet by mouth daily.    HYDROCORTISONE (ANUCORT-HC) 25 MG SUPPOSITORY    Insert one suppository per rectum once daily as needed for control of hemorrhoids   IBUPROFEN (ADVIL,MOTRIN) 800 MG TABLET    Take one up to 4 times daily as needed for pain   LOSARTAN-HYDROCHLOROTHIAZIDE (HYZAAR) 50-12.5 MG TABLET    Take one daily to control blood pressure   VITAMIN B-12 (CYANOCOBALAMIN) 1000 MCG TABLET    Take 1,000 mcg by mouth daily. Take 1 tablet once a day.   VITAMIN C (ASCORBIC ACID) 500 MG TABLET    Take 500 mg by mouth daily. Take 1 tablet once a day.   VITAMIN E 400 UNIT CAPSULE    Take 400 Units by mouth daily. Take 1 capsule once a day.  Modified Medications   No medications on file  Discontinued Medications   No medications on file    Review of Systems  Constitutional: Negative for activity change, appetite change, chills, diaphoresis, fatigue, fever and unexpected weight  change.       Obese   HENT: Positive for hearing loss. Negative for congestion, ear discharge, ear pain, postnasal drip, rhinorrhea, sore throat, tinnitus, trouble swallowing and voice change.   Eyes: Negative for pain, redness, itching and visual disturbance.  Respiratory: Negative for cough, choking, shortness of breath and wheezing.   Cardiovascular: Negative for chest pain, palpitations and leg swelling.  Gastrointestinal: Positive for blood in stool. Negative for abdominal distention, abdominal pain, constipation, diarrhea, nausea and rectal pain.  Hematochezia 03/07/13. No abdominal pain. Mild discomfort in lower back. Colonoscopy done October 2014 due to a history of adenomatous polyp and family history. Sees Dr. Carlean Purl. Hemorrhoids itch from time to time  Endocrine: Negative for cold intolerance, heat intolerance, polydipsia, polyphagia and polyuria.       Diabetic. Mild increase in creatinine.  Genitourinary: Positive for urgency. Negative for difficulty urinating, dysuria, flank pain, frequency, hematuria, pelvic pain and vaginal discharge.       Painless single episide of perineal bleeding  Musculoskeletal: Positive for arthralgias (left hip). Negative for back pain, gait problem, myalgias, neck pain and neck stiffness.       Bilateral mild shoulder pains. S/P right shoulder surgery. Tender plantar fascia bilaterally. Complains of pain in the left ankles.  Skin: Negative for color change, pallor and rash.       Prior painful corns on feet have resolved.  Allergic/Immunologic: Negative.   Neurological: Negative for dizziness, tremors, seizures, syncope, weakness, numbness and headaches.  Hematological: Negative for adenopathy. Does not bruise/bleed easily.  Psychiatric/Behavioral: Negative.  Negative for agitation, behavioral problems, confusion, dysphoric mood, hallucinations, sleep disturbance and suicidal ideas. The patient is not nervous/anxious and is not hyperactive.      Vitals:   11/08/16 1115  BP: 132/72  Pulse: 76  Temp: 98.3 F (36.8 C)  TempSrc: Oral  SpO2: 93%  Weight: 239 lb (108.4 kg)  Height: 5' 1" (1.549 m)   Body mass index is 45.16 kg/m. Filed Weights   11/08/16 1115  Weight: 239 lb (108.4 kg)   Wt Readings from Last 3 Encounters:  11/08/16 239 lb (108.4 kg)  05/17/16 238 lb 12.8 oz (108.3 kg)  01/19/16 235 lb (106.6 kg)     Physical Exam  Constitutional: She is oriented to person, place, and time.  Obese.  HENT:  Head: Normocephalic and atraumatic.  Right Ear: External ear normal.  Left Ear: External ear normal.  Nose: Nose normal.  Mouth/Throat: Oropharynx is clear and moist.  Eyes: Conjunctivae and EOM are normal. Pupils are equal, round, and reactive to light.  Neck: No JVD present. No tracheal deviation present. No thyromegaly present.  Cardiovascular: Normal rate, regular rhythm, normal heart sounds and intact distal pulses.   Pulmonary/Chest: No respiratory distress. She has no wheezes. She has no rales. She exhibits no tenderness.  Abdominal: Bowel sounds are normal. She exhibits no distension and no mass. There is no tenderness.  Genitourinary: Rectal exam shows guaiac negative stool. Vaginal discharge (thin white) found.  Musculoskeletal: Normal range of motion. She exhibits no edema or tenderness.  No focal spots of pain in back or pelvis on palpation Scar right shoulder from surgery 07/02/2014. Tender plantar fascia bilaterally.  Lymphadenopathy:    She has no cervical adenopathy.  Neurological: She is alert and oriented to person, place, and time. She has normal reflexes. No cranial nerve deficit.  Intact vibratory sensation. 11/08/16 MMSE 29/30. Passed clock drawing.  Skin: No rash noted. No erythema. No pallor.  Painful corns on feet  Psychiatric: She has a normal mood and affect. Her behavior is normal. Judgment and thought content normal.    Labs reviewed: Lab Summary Latest Ref Rng & Units  11/06/2016 05/15/2016 01/17/2016 09/10/2015  Hemoglobin 13.0-17.0 g/dL (None) (None) (None) (None)  Hematocrit 39.0-52.0 % (None) (None) (None) (None)  White count - (None) (None) (None) (None)  Platelet count - (None) (None) (None) (None)  Sodium 135 - 146 mmol/L 143 143 145(H) 142  Potassium 3.5 - 5.3 mmol/L  4.1 3.9 4.1 4.1  Calcium 8.6 - 10.4 mg/dL 9.1 8.9 9.0 9.2  Phosphorus - (None) (None) (None) (None)  Creatinine 0.60 - 0.93 mg/dL 0.95(H) 0.98(H) 0.87 1.07(H)  AST 10 - 35 U/L 42(H) (None) (None) 27  Alk Phos 33 - 130 U/L 67 (None) (None) 79  Bilirubin 0.2 - 1.2 mg/dL 0.4 (None) (None) 0.4  Glucose 65 - 99 mg/dL 98 98 106(H) 96  Cholesterol 125 - 200 mg/dL (None) 185 (None) (None)  HDL cholesterol >=46 mg/dL (None) 61 58 (None)  Triglycerides <150 mg/dL (None) 46 60 (None)  LDL Direct - (None) (None) (None) (None)  LDL Calc <130 mg/dL (None) 115 116(H) (None)  Total protein 6.1 - 8.1 g/dL 5.9(L) (None) (None) (None)  Albumin 3.6 - 5.1 g/dL 3.8 (None) (None) 4.2  Some recent data might be hidden   Lab Results  Component Value Date   BUN 20 11/06/2016   Lab Results  Component Value Date   HGBA1C 5.9 (H) 11/06/2016   11/08/16 EKG; rate 65. NSR. Normal  Assessment/Plan  1. Essential hypertension The current medical regimen is effective;  continue present plan and medications. - EKG 12-Lead  2. Type 2 diabetes mellitus with stage 1 chronic kidney disease, without long-term current use of insulin ([place]) Lose weight The current medical regimen is effective;  continue present plan and medications.  3. Class 3 obesity due to excess calories with serious comorbidity and body mass index (BMI) of 45.0 to 49.9 in adult [facility]) lose weight 1000 calorie diet  4. Hyperlipidemia, unspecified hyperlipidemia type controlled

## 2016-11-08 NOTE — Addendum Note (Signed)
Addended by: Moshe Cipro, MESHELL A on: 11/08/2016 01:09 PM   Modules accepted: Orders

## 2016-11-09 LAB — PAP IG (IMAGE GUIDED)

## 2016-11-16 LAB — HM DIABETES EYE EXAM

## 2017-03-29 LAB — HM MAMMOGRAPHY

## 2017-03-30 ENCOUNTER — Encounter: Payer: Self-pay | Admitting: *Deleted

## 2017-04-02 ENCOUNTER — Other Ambulatory Visit: Payer: Self-pay | Admitting: *Deleted

## 2017-04-02 DIAGNOSIS — G8929 Other chronic pain: Secondary | ICD-10-CM

## 2017-04-02 DIAGNOSIS — M545 Low back pain: Principal | ICD-10-CM

## 2017-04-02 MED ORDER — IBUPROFEN 800 MG PO TABS: ORAL_TABLET | ORAL | 1 refills | Status: DC

## 2017-04-05 ENCOUNTER — Other Ambulatory Visit: Payer: Self-pay | Admitting: Internal Medicine

## 2017-04-05 DIAGNOSIS — K648 Other hemorrhoids: Principal | ICD-10-CM

## 2017-04-05 DIAGNOSIS — K644 Residual hemorrhoidal skin tags: Secondary | ICD-10-CM

## 2017-04-16 ENCOUNTER — Ambulatory Visit (INDEPENDENT_AMBULATORY_CARE_PROVIDER_SITE_OTHER): Payer: Medicare Other | Admitting: Nurse Practitioner

## 2017-04-16 ENCOUNTER — Encounter: Payer: Self-pay | Admitting: Nurse Practitioner

## 2017-04-16 VITALS — BP 128/82 | HR 69 | Temp 97.8°F | Resp 17 | Ht 61.0 in | Wt 244.8 lb

## 2017-04-16 DIAGNOSIS — S86912A Strain of unspecified muscle(s) and tendon(s) at lower leg level, left leg, initial encounter: Secondary | ICD-10-CM

## 2017-04-16 DIAGNOSIS — M65319 Trigger thumb, unspecified thumb: Secondary | ICD-10-CM

## 2017-04-16 NOTE — Patient Instructions (Addendum)
Take aleve 1 tablet twice daily for 7 day then as needed Stop ibuprofen while on aleve  Heat 2 times daily for 7 days or ice   May use muscle rubs as needed  Rest knee and wear supportive shoes   Knee brace to use as needed for support

## 2017-04-16 NOTE — Progress Notes (Signed)
Careteam: Patient Care Team: Gayland Curry, DO as PCP - General (Geriatric Medicine) Garald Balding, MD as Consulting Physician (Orthopedic Surgery) Rutherford Guys, MD as Consulting Physician (Ophthalmology) Druscilla Brownie, MD as Consulting Physician (Dermatology) Gatha Mayer, MD as Consulting Physician (Gastroenterology)  Advanced Directive information Does Patient Have a Medical Advance Directive?: No  No Known Allergies  Chief Complaint  Patient presents with  . Acute Visit    Pt is being seen due to left knee pain/swelling x 8-9 days. pt states when she stood up, she heard a pop. Pt also c/o left thumb locking at the joint x several weeks.      HPI: Patient is a 76 y.o. female seen in the office today due to swelling in left knee. 8 days ago was getting up from a luncheon heard her knee pop (never heard that sound before). Then felt the whole leg start to swell. Some discomfort but not a pain. Somewhat weaker on the left now but able to do ADLs and walk without problems.  Has to take her time going up the stairs due to swelling  Does not feel unstable.  Swelling does not effect gait. Never had pain to left leg just discomfort. Able to walk without problems.  No redness. Has varicose veins.  Feels tight in her leg.  Overall feels like things are getting better Using ibuprofen as needed occasionally which has helped.  Still wearing compression stockings which help.   Thumb catches at times. When she bends it down has a hard time straightening back out. Painful when it gets caught.  Been ongoing on for 2 weeks and has been about the same.  Sees an orthopedic but for her shoulder and he does not "do hands" Her sister had similar problem but ended up needing surgery and she wants to avoid this.  Review of Systems:  Review of Systems  Constitutional: Negative for chills, fever and weight loss.  HENT: Negative for tinnitus.   Respiratory: Negative for cough,  sputum production and shortness of breath.   Cardiovascular: Positive for leg swelling. Negative for chest pain and palpitations.  Gastrointestinal: Negative for abdominal pain, constipation, diarrhea and heartburn.  Genitourinary: Negative for dysuria, frequency and urgency.  Musculoskeletal: Positive for joint pain and myalgias. Negative for back pain and falls.  Skin: Negative.   Neurological: Negative for dizziness and headaches.    Past Medical History:  Diagnosis Date  . Arthritis    Ankle  . Asymptomatic varicose veins   . Benign neoplasm of colon   . Bilateral shoulder pain 07/02/14  . Cervicitis and endocervicitis 09/13/2011  . Disorder of bone and cartilage, unspecified   . Disorders of bursae and tendons in shoulder region, unspecified   . External hemorrhoids without mention of complication   . Fibrocystic breast   . Generalized osteoarthrosis, unspecified site   . GERD (gastroesophageal reflux disease)   . Hiatal hernia   . Hypertension   . Impacted cerumen 11/07/2011  . Obesity, unspecified   . Other diseases of nasal cavity and sinuses(478.19)   . Pain in joint, ankle and foot   . Pain in joint, pelvic region and thigh   . Pain in joint, shoulder region   . Reflux esophagitis   . Unspecified essential hypertension   . Unspecified vitamin D deficiency    Past Surgical History:  Procedure Laterality Date  . COLONOSCOPY  2006   Dr.Orr  . COLONOSCOPY  07/08/2013   Dr. Carlean Purl  .  [place]   Hemorrhoids   . MOUTH SURGERY  2005   DR LUTINS  . SHOULDER ARTHROSCOPY W/ ACROMIAL REPAIR Right 07/02/14   Dr. Durward Fortes   Social History:   reports that she has never smoked. She has never used smokeless tobacco. She reports that she does not drink alcohol or use drugs.  Family History  Problem Relation Age of Onset  . Hypertension Mother   . Kidney disease Mother        Renal failure  . Cancer Brother        Lymphoma  . ADD / ADHD Son   .  Seizures Son   . Hypertension Sister   . Dementia Sister   . Hypertension Sister   . Hypertension Sister   . Cancer - Other Sister   . Early death Brother        Some type of accident  . Colon cancer Father 26    Medications: Patient's Medications  New Prescriptions   No medications on file  Previous Medications   ANUCORT-HC 25 MG SUPPOSITORY    INSERT 1 SUPPOSITORY PER RECTUM ONCE DAILY FOR HEMORRHOIDS.   ASPIRIN 81 MG TABLET    Take 81 mg by mouth daily. Take 1 tablet once a day.   CALCIUM CARBONATE-VITAMIN D (CALCIUM-VITAMIN D) 500-200 MG-UNIT PER TABLET    Take 1 tablet by mouth 2 (two) times daily. Take 1 tablet twice a day to help bones.   CARBOXYMETHYLCELL-HYPROMELLOSE (GENTEAL) 0.25-0.3 % GEL    Apply 1 application to eye daily. Use 1 application to each eye as needed to alleviate irritation.   CHOLECALCIFEROL (VITAMIN D-3 PO)    Take 1 tablet by mouth daily.    IBUPROFEN (ADVIL,MOTRIN) 800 MG TABLET    Take one up to 4 times daily as needed for pain   LOSARTAN-HYDROCHLOROTHIAZIDE (HYZAAR) 50-12.5 MG TABLET    Take one daily to control blood pressure   VITAMIN B-12 (CYANOCOBALAMIN) 1000 MCG TABLET    Take 1,000 mcg by mouth daily. Take 1 tablet once a day.   VITAMIN C (ASCORBIC ACID) 500 MG TABLET    Take 500 mg by mouth daily. Take 1 tablet once a day.   VITAMIN E 400 UNIT CAPSULE    Take 400 Units by mouth daily. Take 1 capsule once a day.  Modified Medications   No medications on file  Discontinued Medications   No medications on file     Physical Exam:  Vitals:   04/16/17 1446  BP: 128/82  Pulse: 69  Resp: 17  Temp: 97.8 F (36.6 C)  TempSrc: Oral  SpO2: 98%  Weight: 244 lb 12.8 oz (111 kg)  Height: 5\' 1"  (1.549 m)   Body mass index is 46.25 kg/m.  Physical Exam  Constitutional: She is oriented to person, place, and time.  Obese.  Eyes: Pupils are equal, round, and reactive to light. Conjunctivae and EOM are normal.  Neck: No JVD present. No tracheal  deviation present. No thyromegaly present.  Cardiovascular: Normal rate, regular rhythm and normal heart sounds.   Pulmonary/Chest: Effort normal and breath sounds normal.  Musculoskeletal: Normal range of motion. She exhibits no edema.       Left knee: She exhibits swelling (mild). She exhibits normal range of motion, no LCL laxity, normal patellar mobility and no MCL laxity. Tenderness found. Medial joint line and MCL tenderness noted.  Lymphadenopathy:    She has no cervical adenopathy.  Neurological: She is alert and oriented  to person, place, and time. She has normal reflexes. No cranial nerve deficit.  Intact vibratory sensation.  Skin: Skin is warm and dry. No rash noted. No erythema. No pallor.  Psychiatric: She has a normal mood and affect.    Labs reviewed: Basic Metabolic Panel:  Recent Labs  05/15/16 0929 11/06/16 0908  NA 143 143  K 3.9 4.1  CL 109 108  CO2 23 25  GLUCOSE 98 98  BUN 15 20  CREATININE 0.98* 0.95*  CALCIUM 8.9 9.1   Liver Function Tests:  Recent Labs  11/06/16 0908  AST 42*  ALT 28  ALKPHOS 67  BILITOT 0.4  PROT 5.9*  ALBUMIN 3.8   No results for input(s): LIPASE, AMYLASE in the last 8760 hours. No results for input(s): AMMONIA in the last 8760 hours. CBC: No results for input(s): WBC, NEUTROABS, HGB, HCT, MCV, PLT in the last 8760 hours. Lipid Panel:  Recent Labs  05/15/16 0929  CHOL 185  HDL 61  LDLCALC 115  TRIG 46  CHOLHDL 3.0   TSH: No results for input(s): TSH in the last 8760 hours. A1C: Lab Results  Component Value Date   HGBA1C 5.9 (H) 11/06/2016     Assessment/Plan 1. Strain of left knee, initial encounter -good ROM without instability. Tenderness to medial aspect of knee on exam.  -to use knee brace for support -aleve 1 tablet by mouth twice daily for 7 days (not to use ibuprofen)  -to ice knee as needed -to notify if symptoms worsen or fail to improve  -cont to wear compression hose  2. Stenosing  tenosynovitis of thumb - Ambulatory referral to Orthopedics for further evaluation and possible injection    Kyerra Vargo K. Harle Battiest  [facility] [phone] 8 am - 5 pm) [phone] (after hours)

## 2017-04-26 ENCOUNTER — Other Ambulatory Visit: Payer: Self-pay | Admitting: Internal Medicine

## 2017-04-26 DIAGNOSIS — I10 Essential (primary) hypertension: Secondary | ICD-10-CM

## 2017-04-30 ENCOUNTER — Other Ambulatory Visit: Payer: Self-pay | Admitting: *Deleted

## 2017-04-30 DIAGNOSIS — N181 Chronic kidney disease, stage 1: Secondary | ICD-10-CM

## 2017-04-30 DIAGNOSIS — E7849 Other hyperlipidemia: Secondary | ICD-10-CM

## 2017-04-30 DIAGNOSIS — E1122 Type 2 diabetes mellitus with diabetic chronic kidney disease: Secondary | ICD-10-CM

## 2017-05-07 ENCOUNTER — Encounter (INDEPENDENT_AMBULATORY_CARE_PROVIDER_SITE_OTHER): Payer: Self-pay | Admitting: Orthopaedic Surgery

## 2017-05-07 ENCOUNTER — Ambulatory Visit (INDEPENDENT_AMBULATORY_CARE_PROVIDER_SITE_OTHER): Payer: Medicare Other | Admitting: Orthopaedic Surgery

## 2017-05-07 ENCOUNTER — Ambulatory Visit (INDEPENDENT_AMBULATORY_CARE_PROVIDER_SITE_OTHER): Payer: Self-pay

## 2017-05-07 VITALS — BP 135/79 | HR 77 | Ht 64.0 in | Wt 250.0 lb

## 2017-05-07 DIAGNOSIS — M25562 Pain in left knee: Secondary | ICD-10-CM

## 2017-05-07 NOTE — Progress Notes (Signed)
Office Visit Note   Patient: Danielle Pierce           Date of Birth: 1941/03/06           MRN: [id number] Visit Date: 05/07/2017              Requested by: Lauree Chandler, NP [place], [place] [address] PCP: Gayland Curry, DO   Assessment & Plan: Visit Diagnoses:  1. Acute pain of left knee   Osteoarthritis left knee with predominant finding of chondromalacia patella. Left trigger thumb  Plan: Long discussion regarding above 2 problems. I have discussed injection splinting and surgical release of trigger thumb. Danielle Pierce will let me know when she like to do. Also a long discussion regarding issue with her left knee. She does have ibuprofen at home. We can inject her knee and even consider Visco supplementation. At this point she like to continue with the NSAID.  Follow-Up Instructions: No Follow-up on file.   Orders:  Orders Placed This Encounter  Procedures  . XR KNEE 3 VIEW LEFT   No orders of the defined types were placed in this encounter.     Procedures: No procedures performed   Clinical Data: No additional findings.   Subjective: Chief Complaint  Patient presents with  . Left Knee - Pain, Edema    Danielle Pierce is a 76 y o that presents with Left trigger thumb and Left knee pain. She relates she was at a meeting 04/07/2017 and when she got up from the table she heard and felt a "pop" and has had pain since. No radiation, notes varicose veins in leg  . Left Hand - Pain    Left trigger thumb x 1 month.  Danielle Pierce recently attended a conference in [place]. When she got "got up from a sitting position". She felt a pop in her left knee. Prior to that time she was "okay. His knee has swelled on occasion. She's had difficulty going up and down stairs. She takes the stairs "1 at a time" is not had any sensation knee giving way. No distal edema. Denies fever or chills or numbness or tingling. He is localized along the anterior aspect of  her knee. She also relates that she's been having some active triggering of her left thumb without injury or trauma. Denies any numbness or tingling.  HPI  Review of Systems  Constitutional: Negative for chills, fatigue and fever.  Eyes: Negative for itching.  Respiratory: Negative for chest tightness and shortness of breath.   Cardiovascular: Negative for chest pain, palpitations and leg swelling.  Gastrointestinal: Negative for blood in stool, constipation and diarrhea.  Musculoskeletal: Negative for back pain, joint swelling, neck pain and neck stiffness.  Neurological: Negative for dizziness and numbness.  Hematological: Does not bruise/bleed easily.  Psychiatric/Behavioral: The patient is not nervous/anxious.   All other systems reviewed and are negative.    Objective: Vital Signs: BP 135/79   Pulse 77   Ht 5\' 4"  (1.626 m)   Wt 250 lb (113.4 kg)   BMI 42.91 kg/m   Physical Exam  Ortho Exam left knee with very small effusion. Considerable patellar crepitation and pain with patellar compression mostly laterally. Full extension. Flexed over 105 without instability. Minimal medial lateral joint pain. No opening with varus or valgus stress. Negative anterior drawer sign. No calf pain. Mild distal edema. Skin intact. Sensory exam intact. +1 pulses Active triggering of left thumb with pain full nodule on  the palmar aspect of the thumb at the level of the metacarpal phalangeal joint. No IP joint pain. Skin intact. Neurovascular exam intact  Specialty Comments:  No specialty comments available.  Imaging: No results found.   PMFS History: Patient Active Problem List   Diagnosis Date Noted  . Female perineal bleeding 11/08/2016  . HLD (hyperlipidemia) 01/19/2016  . Fasciitis 12/15/2015  . Corn 05/18/2015  . Excessive cerumen in both ear canals 05/18/2015  . Age spots 04/08/2014  . Blepharochalasis of right eye 04/08/2014  . Lumbago 04/08/2014  . Personal history of  colonic adenomas 07/08/2013  . Family history of colon cancer- father in 53s 07/08/2013  . Internal and external bleeding hemorrhoids 07/08/2013  . DM (diabetes mellitus), type 2 with renal complications ([place]) 33/38/3291  . Essential hypertension 02/20/2013  . Obesity   . Vitamin D deficiency    Past Medical History:  Diagnosis Date  . Arthritis    Ankle  . Asymptomatic varicose veins   . Benign neoplasm of colon   . Bilateral shoulder pain 07/02/14  . Cervicitis and endocervicitis 09/13/2011  . Disorder of bone and cartilage, unspecified   . Disorders of bursae and tendons in shoulder region, unspecified   . External hemorrhoids without mention of complication   . Fibrocystic breast   . Generalized osteoarthrosis, unspecified site   . GERD (gastroesophageal reflux disease)   . Hiatal hernia   . Hypertension   . Impacted cerumen 11/07/2011  . Obesity, unspecified   . Other diseases of nasal cavity and sinuses(478.19)   . Pain in joint, ankle and foot   . Pain in joint, pelvic region and thigh   . Pain in joint, shoulder region   . Reflux esophagitis   . Unspecified essential hypertension   . Unspecified vitamin D deficiency     Family History  Problem Relation Age of Onset  . Hypertension Mother   . Kidney disease Mother        Renal failure  . Cancer Brother        Lymphoma  . ADD / ADHD Son   . Seizures Son   . Hypertension Sister   . Dementia Sister   . Hypertension Sister   . Hypertension Sister   . Cancer - Other Sister   . Early death Brother        Some type of accident  . Colon cancer Father 52    Past Surgical History:  Procedure Laterality Date  . COLONOSCOPY  2006   Dr.Orr  . COLONOSCOPY  07/08/2013   Dr. Carlean Purl  . [place]   Hemorrhoids   . MOUTH SURGERY  2005   DR LUTINS  . SHOULDER ARTHROSCOPY W/ ACROMIAL REPAIR Right 07/02/14   Dr. Durward Fortes   Social History   Occupational History  . retired Landscape architect     Social History Main Topics  . Smoking status: Never Smoker  . Smokeless tobacco: Never Used  . Alcohol use No  . Drug use: No  . Sexual activity: Not Currently

## 2017-05-08 ENCOUNTER — Other Ambulatory Visit: Payer: Medicare Other

## 2017-05-08 DIAGNOSIS — E7849 Other hyperlipidemia: Secondary | ICD-10-CM

## 2017-05-08 DIAGNOSIS — E1122 Type 2 diabetes mellitus with diabetic chronic kidney disease: Secondary | ICD-10-CM

## 2017-05-08 DIAGNOSIS — N181 Chronic kidney disease, stage 1: Secondary | ICD-10-CM

## 2017-05-08 LAB — COMPREHENSIVE METABOLIC PANEL
ALT: 11 U/L (ref 6–29)
AST: 20 U/L (ref 10–35)
Albumin: 4.2 g/dL (ref 3.6–5.1)
Alkaline Phosphatase: 84 U/L (ref 33–130)
BUN: 14 mg/dL (ref 7–25)
CO2: 25 mmol/L (ref 20–32)
Calcium: 9.3 mg/dL (ref 8.6–10.4)
Chloride: 105 mmol/L (ref 98–110)
Creat: 1.07 mg/dL — ABNORMAL HIGH (ref 0.60–0.93)
Glucose, Bld: 102 mg/dL — ABNORMAL HIGH (ref 65–99)
Potassium: 3.5 mmol/L (ref 3.5–5.3)
Sodium: 141 mmol/L (ref 135–146)
Total Bilirubin: 0.6 mg/dL (ref 0.2–1.2)
Total Protein: 6.2 g/dL (ref 6.1–8.1)

## 2017-05-08 LAB — LIPID PANEL
Cholesterol: 188 mg/dL (ref <200)
HDL: 59 mg/dL (ref >50)
LDL Cholesterol: 116 mg/dL — ABNORMAL HIGH (ref <100)
Total CHOL/HDL Ratio: 3.2 Ratio (ref <5.0)
Triglycerides: 67 mg/dL (ref <150)
VLDL: 13 mg/dL (ref <30)

## 2017-05-09 LAB — MICROALBUMIN / CREATININE URINE RATIO
Creatinine, Urine: 451 mg/dL — ABNORMAL HIGH (ref 20–320)
Microalb Creat Ratio: 13 mcg/mg creat (ref <30)
Microalb, Ur: 5.8 mg/dL

## 2017-05-09 LAB — HEMOGLOBIN A1C
Hgb A1c MFr Bld: 5.9 % — ABNORMAL HIGH (ref <5.7)
Mean Plasma Glucose: 123 mg/dL

## 2017-05-10 ENCOUNTER — Encounter: Payer: Self-pay | Admitting: Internal Medicine

## 2017-05-10 ENCOUNTER — Ambulatory Visit (INDEPENDENT_AMBULATORY_CARE_PROVIDER_SITE_OTHER): Payer: Medicare Other | Admitting: Internal Medicine

## 2017-05-10 VITALS — BP 132/80 | HR 68 | Temp 97.6°F | Wt 237.0 lb

## 2017-05-10 DIAGNOSIS — K648 Other hemorrhoids: Secondary | ICD-10-CM | POA: Diagnosis not present

## 2017-05-10 DIAGNOSIS — I10 Essential (primary) hypertension: Secondary | ICD-10-CM

## 2017-05-10 DIAGNOSIS — Z23 Encounter for immunization: Secondary | ICD-10-CM | POA: Diagnosis not present

## 2017-05-10 DIAGNOSIS — Z6841 Body Mass Index (BMI) 40.0 and over, adult: Secondary | ICD-10-CM | POA: Diagnosis not present

## 2017-05-10 DIAGNOSIS — E785 Hyperlipidemia, unspecified: Secondary | ICD-10-CM | POA: Diagnosis not present

## 2017-05-10 DIAGNOSIS — Z86010 Personal history of colonic polyps: Secondary | ICD-10-CM

## 2017-05-10 DIAGNOSIS — N181 Chronic kidney disease, stage 1: Secondary | ICD-10-CM

## 2017-05-10 DIAGNOSIS — K644 Residual hemorrhoidal skin tags: Secondary | ICD-10-CM

## 2017-05-10 DIAGNOSIS — M1712 Unilateral primary osteoarthritis, left knee: Secondary | ICD-10-CM | POA: Insufficient documentation

## 2017-05-10 DIAGNOSIS — E1122 Type 2 diabetes mellitus with diabetic chronic kidney disease: Secondary | ICD-10-CM | POA: Diagnosis not present

## 2017-05-10 MED ORDER — TETANUS-DIPHTH-ACELL PERTUSSIS 5-2.5-18.5 LF-MCG/0.5 IM SUSP: 0.5000 mL | Freq: Once | INTRAMUSCULAR | 0 refills | Status: AC

## 2017-05-10 MED ORDER — DICLOFENAC SODIUM 1 % TD GEL: 4.0000 g | Freq: Four times a day (QID) | TRANSDERMAL | 3 refills | Status: DC

## 2017-05-10 NOTE — Patient Instructions (Signed)
Please elevate your left leg at rest, compress with ace wrap and apply ice.  Gradually begin your walking routine as directed by orthopedics.  Try to cut back on fatty foods to lower your cholesterol.

## 2017-05-10 NOTE — Progress Notes (Signed)
Location:  [facility] clinic Provider:  Pat Elicker L. Mariea Clonts, D.O., C.M.D.  Code Status: full code Goals of Care:  Advanced Directives 05/10/2017  Does Patient Have a Medical Advance Directive? No  Would patient like information on creating a medical advance directive? No - Patient declined   Chief Complaint  Patient presents with  . Medical Management of Chronic Issues    59mth follow-up, transfer from Dr. Nyoka Cowden    HPI: Patient is a 76 y.o. female seen today for medical management of chronic diseases.  This is her first visit with me after seeing Dr. Nyoka Cowden for many years.  Her husband already sees me.    Left knee had fluid and arthritis after she got up from the table at a meeting--then walked on it a full week.  Tried walking on an elliptical.  She wants to wrap it with an ace.  Hurt when she took her compression hose off yesterday.  Having to take 2 ibuprofen every 4-6 hrs and using ice.  She got aleve but has not started it yet.    DMII with renal complications:  Not on medication.   Lab Results  Component Value Date   HGBA1C 5.9 (H) 05/08/2017  Is on losartan/hctz.    Essential htn:  bp <140/90.  On losartan/hctz.    Hyperlipidemia:  LDL above goal for diabetes of <70. Lab Results  Component Value Date   CHOL 188 05/08/2017   HDL 59 05/08/2017   LDLCALC 116 (H) 05/08/2017   TRIG 67 05/08/2017   CHOLHDL 3.2 05/08/2017  Counseled on diet.    Vitamin D deficiency:  Continues on Vitamin D3  Obesity:  Ongoing.  Has been off exercise since 7/7 and normally goes 3-4 times per week.  Thinks she may be able to walk on the treadmill with a brace.  H/o colon adenomas and fh/o colon cancer in father.  Had colon small adenomas in 2003 and 2009, but none in 2014 so due 2019 for next cscope with Dr. Carlean Purl.  Had hemorrhoids and diverticulosis.  Hemorrhoids:  Uses anucort suppository.  Only needs occasionally.  Does fine if she eats her greens.    On B12, no recent levels.  Also takes C and  E.    03/30/17 mammogram at [facility] was normal.    Counseled on importance of pneumonia and flu shots.  Agreed to her prevnar today.    Past Medical History:  Diagnosis Date  . Arthritis    Ankle  . Asymptomatic varicose veins   . Benign neoplasm of colon   . Bilateral shoulder pain 07/02/14  . Cervicitis and endocervicitis 09/13/2011  . Disorder of bone and cartilage, unspecified   . Disorders of bursae and tendons in shoulder region, unspecified   . External hemorrhoids without mention of complication   . Fibrocystic breast   . Generalized osteoarthrosis, unspecified site   . GERD (gastroesophageal reflux disease)   . Hiatal hernia   . Hypertension   . Impacted cerumen 11/07/2011  . Obesity, unspecified   . Other diseases of nasal cavity and sinuses(478.19)   . Pain in joint, ankle and foot   . Pain in joint, pelvic region and thigh   . Pain in joint, shoulder region   . Reflux esophagitis   . Unspecified essential hypertension   . Unspecified vitamin D deficiency     Past Surgical History:  Procedure Laterality Date  . COLONOSCOPY  2006   Dr.Orr  . COLONOSCOPY  07/08/2013   Dr.  Carlean Purl  . [place]   Hemorrhoids   . MOUTH SURGERY  2005   DR LUTINS  . SHOULDER ARTHROSCOPY W/ ACROMIAL REPAIR Right 07/02/14   Dr. Durward Fortes    No Known Allergies  Allergies as of 05/10/2017   No Known Allergies     Medication List       Accurate as of 05/10/17 11:35 AM. Always use your most recent med list.          ANUCORT-HC 25 MG suppository Generic drug:  hydrocortisone INSERT 1 SUPPOSITORY PER RECTUM ONCE DAILY FOR HEMORRHOIDS.   aspirin 81 MG tablet Take 81 mg by mouth daily. Take 1 tablet once a day.   calcium-vitamin D 500-200 MG-UNIT tablet Take 1 tablet by mouth 2 (two) times daily. Take 1 tablet twice a day to help bones.   GENTEAL 0.25-0.3 % Gel Generic drug:  Carboxymethylcell-Hypromellose Apply 1 application to eye daily. Use 1 application  to each eye as needed to alleviate irritation.   ibuprofen 800 MG tablet Commonly known as:  ADVIL,MOTRIN Take one up to 4 times daily as needed for pain   losartan-hydrochlorothiazide 50-12.5 MG tablet Commonly known as:  HYZAAR TAKE 1 TABLET DAILY FOR BLOOD PRESSURE.   vitamin B-12 1000 MCG tablet Commonly known as:  CYANOCOBALAMIN Take 1,000 mcg by mouth daily. Take 1 tablet once a day.   vitamin C 500 MG tablet Commonly known as:  ASCORBIC ACID Take 500 mg by mouth daily. Take 1 tablet once a day.   VITAMIN D-3 PO Take 1 tablet by mouth daily.   vitamin E 400 UNIT capsule Take 400 Units by mouth daily. Take 1 capsule once a day.       Review of Systems:  Review of Systems  Constitutional: Negative for chills, fever and malaise/fatigue.  HENT: Negative for congestion.   Eyes: Negative for blurred vision.  Respiratory: Negative for cough and shortness of breath.   Cardiovascular: Negative for chest pain, palpitations and leg swelling.  Gastrointestinal: Positive for constipation. Negative for abdominal pain, blood in stool, diarrhea and melena.       Hemorrhoids  Genitourinary: Negative for dysuria.  Musculoskeletal: Positive for joint pain. Negative for back pain, falls and myalgias.  Skin: Negative for itching and rash.  Neurological: Negative for dizziness, loss of consciousness and weakness.  Psychiatric/Behavioral: Negative for depression and memory loss. The patient is not nervous/anxious and does not have insomnia.     Health Maintenance  Topic Date Due  . TETANUS/TDAP  04/30/2008  . OPHTHALMOLOGY EXAM  05/19/2016  . FOOT EXAM  05/17/2017  . HEMOGLOBIN A1C  11/08/2017  . COLONOSCOPY  07/08/2018  . MAMMOGRAM  03/30/2019  . DEXA SCAN  Completed    Physical Exam: Vitals:   05/10/17 1111  BP: 132/80  Pulse: 68  Temp: 97.6 F (36.4 C)  TempSrc: Oral  SpO2: 96%  Weight: 237 lb (107.5 kg)   Body mass index is 40.68 kg/m. Physical Exam    Constitutional: She is oriented to person, place, and time. She appears well-developed and well-nourished. No distress.  Cardiovascular: Normal rate, regular rhythm, normal heart sounds and intact distal pulses.   Pulmonary/Chest: Effort normal and breath sounds normal. No respiratory distress.  Abdominal: Soft. Bowel sounds are normal. She exhibits no distension.  Musculoskeletal: She exhibits tenderness.  Left knee, mild effusion present  Neurological: She is alert and oriented to person, place, and time. No cranial nerve deficit.  Skin: Skin is warm and  dry.  Psychiatric: She has a normal mood and affect.    Labs reviewed: Basic Metabolic Panel:  Recent Labs  05/15/16 0929 11/06/16 0908 05/08/17 0812  NA 143 143 141  K 3.9 4.1 3.5  CL 109 108 105  CO2 23 25 25   GLUCOSE 98 98 102*  BUN 15 20 14   CREATININE 0.98* 0.95* 1.07*  CALCIUM 8.9 9.1 9.3   Liver Function Tests:  Recent Labs  11/06/16 0908 05/08/17 0812  AST 42* 20  ALT 28 11  ALKPHOS 67 84  BILITOT 0.4 0.6  PROT 5.9* 6.2  ALBUMIN 3.8 4.2   No results for input(s): LIPASE, AMYLASE in the last 8760 hours. No results for input(s): AMMONIA in the last 8760 hours. CBC: No results for input(s): WBC, NEUTROABS, HGB, HCT, MCV, PLT in the last 8760 hours. Lipid Panel:  Recent Labs  05/15/16 0929 05/08/17 0812  CHOL 185 188  HDL 61 59  LDLCALC 115 116*  TRIG 46 67  CHOLHDL 3.0 3.2   Lab Results  Component Value Date   HGBA1C 5.9 (H) 05/08/2017    Procedures since last visit: Xr Knee 3 View Left  Result Date: 05/07/2017 Films of the left knee were obtained in 3 projections standing. There are some mild degenerative changes in medial lateral compartment with very small osteophytes. The predominant finding is significant degenerative change at the patellofemoral joint particularly laterally with there is a decrease in the joint space and peripheral osteophyte formation. Symptoms are predominantly  consistent with the x-ray findings and chondromalacia patella   Assessment/Plan 1. Localized osteoarthritis of left knee -xray reviewed -recommended rest, ice, elevate, compress as seems she had a meniscal or ligamental injury, but does have significant OA in knee, as well -start on voltaren gel for this pain -counseled on only short term nsaid use due to her diabetes, losartan and risks of renal and gi issues if used ongoing  2. Essential hypertension -bp ok with current regimen so cont same and monitor - COMPLETE METABOLIC PANEL WITH GFR; Future  3. Internal and external bleeding hemorrhoids -cont anucort supps as needed, not recently bothersome - CBC with Differential/Platelet; Future  4. Type 2 diabetes mellitus with stage 1 chronic kidney disease, without long-term current use of insulin (HCC) -last hba1c good, cont baby asa, arb, not on statin or diabetes meds  - Hemoglobin A1c; Future - Lipid panel; Future  5. Personal history of colonic adenomas - next cscope due 2019 with Dr. Carlean Purl due to this, no active symptoms - CBC with Differential/Platelet; Future  6. Class 3 severe obesity due to excess calories with serious comorbidity and body mass index (BMI) of 45.0 to 49.9 in adult [facility]) -has not been doing her exercise due to knee injury, plans to use an ace wrap and slowly get back to it - Lipid panel; Future  7. Hyperlipidemia, unspecified hyperlipidemia type - f/u lab:  - Lipid panel; Future -not on statin and may need to be if LDL >70  8. Need for vaccination with 13-polyvalent pneumococcal conjugate vaccine - Pneumococcal conjugate vaccine 13-valent given after much convincing  Labs/tests ordered:   Orders Placed This Encounter  Procedures  . Pneumococcal conjugate vaccine 13-valent  . COMPLETE METABOLIC PANEL WITH GFR    Standing Status:   Future    Standing Expiration Date:   01/08/2018  . CBC with Differential/Platelet    Standing Status:   Future     Standing Expiration Date:   01/08/2018  . Hemoglobin  A1c    Standing Status:   Future    Standing Expiration Date:   01/08/2018  . Lipid panel    Standing Status:   Future    Standing Expiration Date:   01/08/2018   Next appt:  09/17/2017 med mgt, labs before  Armonie Mettler L. Khaleb Broz, D.O. [facility] [address], [place] [address] Cell Phone (Mon-Fri 8am-5pm):  [phone] On Call:  [phone] & follow prompts after 5pm & weekends Office Phone:  [phone] Office Fax:  [phone]

## 2017-05-17 ENCOUNTER — Telehealth: Payer: Self-pay | Admitting: *Deleted

## 2017-05-17 NOTE — Telephone Encounter (Signed)
A fax was received from [employer] stating that PA was approved for diclofenac sodium topical gel 1%. The fax stated the following:  " As long as you remain covered by the [facility] and there are no changes to your plan benefits, this request is approved for the following time period: 05/17/2017 to 05/17/2020.   Patient and [place] were notified of approval.

## 2017-05-17 NOTE — Telephone Encounter (Signed)
Received Prior Authorization request from [facility] for Diclofenac 1% gel. Initiated through [facility] [employer] [facility].  TK#[id number] DDU:[id number] HC:[id number] BIN [id number] GrpRx [id number] Went into determination 24-48 hours.

## 2017-07-26 ENCOUNTER — Other Ambulatory Visit: Payer: Self-pay | Admitting: Internal Medicine

## 2017-07-26 DIAGNOSIS — G8929 Other chronic pain: Secondary | ICD-10-CM

## 2017-07-26 DIAGNOSIS — M545 Low back pain, unspecified: Secondary | ICD-10-CM

## 2017-07-26 DIAGNOSIS — I10 Essential (primary) hypertension: Secondary | ICD-10-CM

## 2017-09-13 ENCOUNTER — Other Ambulatory Visit: Payer: Medicare Other

## 2017-09-13 ENCOUNTER — Other Ambulatory Visit: Payer: Self-pay | Admitting: *Deleted

## 2017-09-13 DIAGNOSIS — I10 Essential (primary) hypertension: Secondary | ICD-10-CM

## 2017-09-13 DIAGNOSIS — K648 Other hemorrhoids: Secondary | ICD-10-CM

## 2017-09-13 DIAGNOSIS — K644 Residual hemorrhoidal skin tags: Secondary | ICD-10-CM

## 2017-09-13 DIAGNOSIS — Z6841 Body Mass Index (BMI) 40.0 and over, adult: Secondary | ICD-10-CM

## 2017-09-13 DIAGNOSIS — E1122 Type 2 diabetes mellitus with diabetic chronic kidney disease: Secondary | ICD-10-CM

## 2017-09-13 DIAGNOSIS — E785 Hyperlipidemia, unspecified: Secondary | ICD-10-CM

## 2017-09-13 DIAGNOSIS — Z86010 Personal history of colonic polyps: Secondary | ICD-10-CM

## 2017-09-13 DIAGNOSIS — N181 Chronic kidney disease, stage 1: Principal | ICD-10-CM

## 2017-09-14 LAB — CBC WITH DIFFERENTIAL/PLATELET
Basophils Absolute: 27 cells/uL (ref 0–200)
Basophils Relative: 0.4 %
Eosinophils Absolute: 201 cells/uL (ref 15–500)
Eosinophils Relative: 3 %
HCT: 36.9 % (ref 35.0–45.0)
Hemoglobin: 12.6 g/dL (ref 11.7–15.5)
Lymphs Abs: 2700 cells/uL (ref 850–3900)
MCH: 31.1 pg (ref 27.0–33.0)
MCHC: 34.1 g/dL (ref 32.0–36.0)
MCV: 91.1 fL (ref 80.0–100.0)
MPV: 10.6 fL (ref 7.5–12.5)
Monocytes Relative: 8.6 %
Neutro Abs: 3196 cells/uL (ref 1500–7800)
Neutrophils Relative %: 47.7 %
Platelets: 211 10*3/uL (ref 140–400)
RBC: 4.05 10*6/uL (ref 3.80–5.10)
RDW: 13.7 % (ref 11.0–15.0)
Total Lymphocyte: 40.3 %
WBC mixed population: 576 cells/uL (ref 200–950)
WBC: 6.7 10*3/uL (ref 3.8–10.8)

## 2017-09-14 LAB — COMPLETE METABOLIC PANEL WITH GFR
AG Ratio: 2 (calc) (ref 1.0–2.5)
ALT: 16 U/L (ref 6–29)
AST: 28 U/L (ref 10–35)
Albumin: 4.2 g/dL (ref 3.6–5.1)
Alkaline phosphatase (APISO): 74 U/L (ref 33–130)
BUN/Creatinine Ratio: 18 (calc) (ref 6–22)
BUN: 17 mg/dL (ref 7–25)
CO2: 25 mmol/L (ref 20–32)
Calcium: 9.4 mg/dL (ref 8.6–10.4)
Chloride: 106 mmol/L (ref 98–110)
Creat: 0.97 mg/dL — ABNORMAL HIGH (ref 0.60–0.93)
GFR, Est African American: 66 mL/min/{1.73_m2} (ref >=60)
GFR, Est Non African American: 57 mL/min/{1.73_m2} — ABNORMAL LOW (ref >=60)
Globulin: 2.1 g/dL (calc) (ref 1.9–3.7)
Glucose, Bld: 105 mg/dL — ABNORMAL HIGH (ref 65–99)
Potassium: 3.8 mmol/L (ref 3.5–5.3)
Sodium: 141 mmol/L (ref 135–146)
Total Bilirubin: 0.6 mg/dL (ref 0.2–1.2)
Total Protein: 6.3 g/dL (ref 6.1–8.1)

## 2017-09-14 LAB — LIPID PANEL
Cholesterol: 200 mg/dL — ABNORMAL HIGH (ref <200)
HDL: 65 mg/dL (ref >50)
LDL Cholesterol (Calc): 121 mg/dL (calc) — ABNORMAL HIGH
Non-HDL Cholesterol (Calc): 135 mg/dL (calc) — ABNORMAL HIGH (ref <130)
Total CHOL/HDL Ratio: 3.1 (calc) (ref <5.0)
Triglycerides: 57 mg/dL (ref <150)

## 2017-09-14 LAB — HEMOGLOBIN A1C
Hgb A1c MFr Bld: 5.9 % of total Hgb — ABNORMAL HIGH (ref <5.7)
Mean Plasma Glucose: 123 (calc)
eAG (mmol/L): 6.8 (calc)

## 2017-09-17 ENCOUNTER — Encounter: Payer: Self-pay | Admitting: Internal Medicine

## 2017-09-17 ENCOUNTER — Ambulatory Visit: Payer: Medicare Other | Admitting: Internal Medicine

## 2017-09-17 VITALS — BP 142/90 | HR 69 | Temp 98.1°F | Ht 64.0 in | Wt 239.0 lb

## 2017-09-17 DIAGNOSIS — E1122 Type 2 diabetes mellitus with diabetic chronic kidney disease: Secondary | ICD-10-CM

## 2017-09-17 DIAGNOSIS — Z6841 Body Mass Index (BMI) 40.0 and over, adult: Secondary | ICD-10-CM

## 2017-09-17 DIAGNOSIS — M1712 Unilateral primary osteoarthritis, left knee: Secondary | ICD-10-CM

## 2017-09-17 DIAGNOSIS — E785 Hyperlipidemia, unspecified: Secondary | ICD-10-CM | POA: Diagnosis not present

## 2017-09-17 DIAGNOSIS — I10 Essential (primary) hypertension: Secondary | ICD-10-CM | POA: Diagnosis not present

## 2017-09-17 DIAGNOSIS — Z23 Encounter for immunization: Secondary | ICD-10-CM | POA: Diagnosis not present

## 2017-09-17 DIAGNOSIS — N181 Chronic kidney disease, stage 1: Secondary | ICD-10-CM

## 2017-09-17 NOTE — Patient Instructions (Addendum)
Your husband needs to take his diuretic that I prescribed for his edema.    Please get back on the treadmill flat surface or do group exercise program.

## 2017-09-17 NOTE — Progress Notes (Signed)
Location:  [facility] clinic Provider:  Zarayah Lanting L. Mariea Clonts, D.O., C.M.D.  Code Status: full code Goals of Care:  Advanced Directives 05/10/2017  Does Patient Have a Medical Advance Directive? No  Would patient like information on creating a medical advance directive? No - Patient declined   Chief Complaint  Patient presents with  . Medical Management of Chronic Issues    79mth follow-up, discuss labs    HPI: Patient is a 76 y.o. female seen today for medical management of chronic diseases.    Having caregiver stress with her husband.  He does not want to take his medications.  Sits in recliner next to bathroom and takes diuretic but then wets himself anyway.  He is claiming she is making him take it not me, his doctor.  He is taking his other meds now.  He does not eat like he should.  She does not feel like she wants medication for stress.  She says the treadmill also helps her to be calm.    BP elevated this am.  It's never been that high.    DMII: hba1c stable at 5.9. On ARB, baby asa, not on diabetes meds.    LDL not at goal.  She works part time at [employer], but has not been walking on the treadmill since her knee injury.    OA:  Left knee.  Given Rx last time for voltaren gel.  Was using ice and gel and rubbing it.    Weight about the same--up 2 lbs.    Past Medical History:  Diagnosis Date  . Arthritis    Ankle  . Asymptomatic varicose veins   . Benign neoplasm of colon   . Bilateral shoulder pain 07/02/14  . Cervicitis and endocervicitis 09/13/2011  . Disorder of bone and cartilage, unspecified   . Disorders of bursae and tendons in shoulder region, unspecified   . External hemorrhoids without mention of complication   . Fibrocystic breast   . Generalized osteoarthrosis, unspecified site   . GERD (gastroesophageal reflux disease)   . Hiatal hernia   . Hypertension   . Impacted cerumen 11/07/2011  . Obesity, unspecified   . Other diseases of nasal cavity and  sinuses(478.19)   . Pain in joint, ankle and foot   . Pain in joint, pelvic region and thigh   . Pain in joint, shoulder region   . Reflux esophagitis   . Unspecified essential hypertension   . Unspecified vitamin D deficiency     Past Surgical History:  Procedure Laterality Date  . COLONOSCOPY  2006   Dr.Orr  . COLONOSCOPY  07/08/2013   Dr. Carlean Purl  . [place]   Hemorrhoids   . MOUTH SURGERY  2005   DR LUTINS  . SHOULDER ARTHROSCOPY W/ ACROMIAL REPAIR Right 07/02/14   Dr. Durward Fortes    No Known Allergies  Outpatient Encounter Medications as of 09/17/2017  Medication Sig  . ANUCORT-HC 25 MG suppository INSERT 1 SUPPOSITORY PER RECTUM ONCE DAILY FOR HEMORRHOIDS.  Marland Kitchen aspirin 81 MG tablet Take 81 mg by mouth daily. Take 1 tablet once a day.  . Calcium Carbonate-Vitamin D (CALCIUM-VITAMIN D) 500-200 MG-UNIT per tablet Take 1 tablet by mouth 2 (two) times daily. Take 1 tablet twice a day to help bones.  . Carboxymethylcell-Hypromellose (GENTEAL) 0.25-0.3 % GEL Apply 1 application to eye daily. Use 1 application to each eye as needed to alleviate irritation.  . Cholecalciferol (VITAMIN D-3 PO) Take 1 tablet by mouth  daily.   . diclofenac sodium (VOLTAREN) 1 % GEL Apply 4 g topically 4 (four) times daily. To left knee  . IBU 800 MG tablet TAKE (1) TABLET FOUR TIMES DAILY AS NEEDED FOR PAIN.  Marland Kitchen losartan-hydrochlorothiazide (HYZAAR) 50-12.5 MG tablet TAKE 1 TABLET DAILY FOR BLOOD PRESSURE.  . vitamin B-12 (CYANOCOBALAMIN) 1000 MCG tablet Take 1,000 mcg by mouth daily. Take 1 tablet once a day.  . vitamin C (ASCORBIC ACID) 500 MG tablet Take 500 mg by mouth daily. Take 1 tablet once a day.  . vitamin E 400 UNIT capsule Take 400 Units by mouth daily. Take 1 capsule once a day.   No facility-administered encounter medications on file as of 09/17/2017.     Review of Systems:  Review of Systems  Constitutional: Negative for chills and fever.  HENT: Negative for  congestion.   Eyes: Negative for blurred vision.  Respiratory: Negative for shortness of breath.   Cardiovascular: Negative for chest pain and palpitations.  Gastrointestinal: Negative for abdominal pain, blood in stool, constipation and melena.  Genitourinary: Negative for dysuria.  Musculoskeletal: Positive for joint pain.  Skin: Negative for itching and rash.  Neurological: Negative for dizziness.  Endo/Heme/Allergies: Does not bruise/bleed easily.  Psychiatric/Behavioral: Negative for depression and memory loss. The patient is nervous/anxious. The patient does not have insomnia.     Health Maintenance  Topic Date Due  . TETANUS/TDAP  04/30/2008  . OPHTHALMOLOGY EXAM  05/19/2016  . FOOT EXAM  05/17/2017  . HEMOGLOBIN A1C  03/14/2018  . PNA vac Low Risk Adult (2 of 2 - PPSV23) 05/10/2018  . COLONOSCOPY  07/08/2018  . DEXA SCAN  Completed    Physical Exam: Vitals:   09/17/17 1117  BP: (!) 150/88  Pulse: 69  Temp: 98.1 F (36.7 C)  TempSrc: Oral  SpO2: 93%  Weight: 239 lb (108.4 kg)  Height: 5\' 4"  (1.626 m)   Body mass index is 41.02 kg/m. Physical Exam  Constitutional: She is oriented to person, place, and time. She appears well-developed and well-nourished. No distress.  Cardiovascular: Normal rate, regular rhythm, normal heart sounds and intact distal pulses.  No murmur heard. Pulmonary/Chest: Effort normal and breath sounds normal. No respiratory distress.  Abdominal: Soft. Bowel sounds are normal. She exhibits no distension. There is no tenderness.  Musculoskeletal: Normal range of motion.  Neurological: She is alert and oriented to person, place, and time. No cranial nerve deficit.  Skin: Skin is warm and dry. Capillary refill takes less than 2 seconds.  Psychiatric: She has a normal mood and affect.   Labs reviewed: Basic Metabolic Panel: Recent Labs    11/06/16 0908 05/08/17 0812 09/13/17 1038  NA 143 141 141  K 4.1 3.5 3.8  CL 108 105 106  CO2 25 25  25   GLUCOSE 98 102* 105*  BUN 20 14 17   CREATININE 0.95* 1.07* 0.97*  CALCIUM 9.1 9.3 9.4   Liver Function Tests: Recent Labs    11/06/16 0908 05/08/17 0812 09/13/17 1038  AST 42* 20 28  ALT 28 11 16   ALKPHOS 67 84  --   BILITOT 0.4 0.6 0.6  PROT 5.9* 6.2 6.3  ALBUMIN 3.8 4.2  --    No results for input(s): LIPASE, AMYLASE in the last 8760 hours. No results for input(s): AMMONIA in the last 8760 hours. CBC: Recent Labs    09/13/17 1038  WBC 6.7  NEUTROABS 3,196  HGB 12.6  HCT 36.9  MCV 91.1  PLT 211  Lipid Panel: Recent Labs    05/08/17 0812 09/13/17 1038  CHOL 188 200*  HDL 59 65  LDLCALC 116*  --   TRIG 67 57  CHOLHDL 3.2 3.1   Lab Results  Component Value Date   HGBA1C 5.9 (H) 09/13/2017   Assessment/Plan 1. Localized osteoarthritis of left knee Cont voltaren gel, encouraged return to treadmill flat for exercise  2. Essential hypertension -recheck bp was 142/90--having caregiver stress  - cont arb/hctz  3. Type 2 diabetes mellitus with stage 1 chronic kidney disease, without long-term current use of insulin (HCC) -well controlled, work on diet and exercise -normal foot exam today  4. Class 3 severe obesity due to excess calories with serious comorbidity and body mass index (BMI) of 45.0 to 49.9 in adult [facility]) -work on diet and exercise  5. Hyperlipidemia, unspecified hyperlipidemia type -lipids above goal--encouraged her to return to exercising  6. Need for influenza vaccination -high dose given today  Next appt:  4 mos med mgt, labs before  Orders Placed This Encounter  Procedures  . CBC with Differential/Platelet    Standing Status:   Future    Standing Expiration Date:   09/17/2018  . COMPLETE METABOLIC PANEL WITH GFR    Standing Status:   Future    Standing Expiration Date:   09/17/2018  . Hemoglobin A1c    Standing Status:   Future    Standing Expiration Date:   09/17/2018  . Lipid panel    Standing Status:   Future     Standing Expiration Date:   09/17/2018    Chena Chohan L. Amira Podolak, D.O. [facility] [address], [place] [address] Cell Phone (Mon-Fri 8am-5pm):  [phone] On Call:  [phone] & follow prompts after 5pm & weekends Office Phone:  [phone] Office Fax:  [phone]

## 2017-09-26 ENCOUNTER — Other Ambulatory Visit: Payer: Self-pay | Admitting: Internal Medicine
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
## 2017-10-18 ENCOUNTER — Ambulatory Visit (INDEPENDENT_AMBULATORY_CARE_PROVIDER_SITE_OTHER): Payer: Medicare Other | Admitting: Internal Medicine

## 2017-10-18 ENCOUNTER — Encounter: Payer: Self-pay | Admitting: Internal Medicine

## 2017-10-18 VITALS — BP 120/80 | HR 88 | Temp 98.3°F | Wt 238.0 lb

## 2017-10-18 DIAGNOSIS — K120 Recurrent oral aphthae: Secondary | ICD-10-CM

## 2017-10-18 DIAGNOSIS — J208 Acute bronchitis due to other specified organisms: Secondary | ICD-10-CM

## 2017-10-18 DIAGNOSIS — K644 Residual hemorrhoidal skin tags: Secondary | ICD-10-CM | POA: Diagnosis not present

## 2017-10-18 NOTE — Patient Instructions (Addendum)
Pick up some mucinex for your congestion--600mg  twice a day (generic is fine). Drink plenty of water. Rest up. Use warm humidity like in the shower, with a vaporizer or inhaling hot steam from a pot on the stove. You should get gradually better over the next couple of weeks. If you develop fever, chest pains or are not improving in another week, call us back.    Avoid constipation.  Use your suppositories for the hemorrhoids when itchy, bleeding or painful.    Acute Bronchitis, Adult Acute bronchitis is sudden (acute) swelling of the air tubes (bronchi) in the lungs. Acute bronchitis causes these tubes to fill with mucus, which can make it hard to breathe. It can also cause coughing or wheezing. In adults, acute bronchitis usually goes away within 2 weeks. A cough caused by bronchitis may last up to 3 weeks. Smoking, allergies, and asthma can make the condition worse. Repeated episodes of bronchitis may cause further lung problems, such as chronic obstructive pulmonary disease (COPD). What are the causes? This condition can be caused by germs and by substances that irritate the lungs, including:  Cold and flu viruses. This condition is most often caused by the same virus that causes a cold.  Bacteria.  Exposure to tobacco smoke, dust, fumes, and air pollution.  What increases the risk? This condition is more likely to develop in people who:  Have close contact with someone with acute bronchitis.  Are exposed to lung irritants, such as tobacco smoke, dust, fumes, and vapors.  Have a weak immune system.  Have a respiratory condition such as asthma.  What are the signs or symptoms? Symptoms of this condition include:  A cough.  Coughing up clear, yellow, or green mucus.  Wheezing.  Chest congestion.  Shortness of breath.  A fever.  Body aches.  Chills.  A sore throat.  How is this diagnosed? This condition is usually diagnosed with a physical exam. During the exam,  your health care provider may order tests, such as chest X-rays, to rule out other conditions. He or she may also:  Test a sample of your mucus for bacterial infection.  Check the level of oxygen in your blood. This is done to check for pneumonia.  Do a chest X-ray or lung function testing to rule out pneumonia and other conditions.  Perform blood tests.  Your health care provider will also ask about your symptoms and medical history. How is this treated? Most cases of acute bronchitis clear up over time without treatment. Your health care provider may recommend:  Drinking more fluids. Drinking more makes your mucus thinner, which may make it easier to breathe.  Taking a medicine for a fever or cough.  Taking an antibiotic medicine.  Using an inhaler to help improve shortness of breath and to control a cough.  Using a cool mist vaporizer or humidifier to make it easier to breathe.  Follow these instructions at home: Medicines  Take over-the-counter and prescription medicines only as told by your health care provider.  If you were prescribed an antibiotic, take it as told by your health care provider. Do not stop taking the antibiotic even if you start to feel better. General instructions  Get plenty of rest.  Drink enough fluids to keep your urine clear or pale yellow.  Avoid smoking and secondhand smoke. Exposure to cigarette smoke or irritating chemicals will make bronchitis worse. If you smoke and you need help quitting, ask your health care provider. Quitting smoking will  help your lungs heal faster.  Use an inhaler, cool mist vaporizer, or humidifier as told by your health care provider.  Keep all follow-up visits as told by your health care provider. This is important. How is this prevented? To lower your risk of getting this condition again:  Wash your hands often with soap and water. If soap and water are not available, use hand sanitizer.  Avoid contact with  people who have cold symptoms.  Try not to touch your hands to your mouth, nose, or eyes.  Make sure to get the flu shot every year.  Contact a health care provider if:  Your symptoms do not improve in 2 weeks of treatment. Get help right away if:  You cough up blood.  You have chest pain.  You have severe shortness of breath.  You become dehydrated.  You faint or keep feeling like you are going to faint.  You keep vomiting.  You have a severe headache.  Your fever or chills gets worse. This information is not intended to replace advice given to you by your health care provider. Make sure you discuss any questions you have with your health care provider. Document Released: 10/26/2004 Document Revised: 04/12/2016 Document Reviewed: 03/08/2016 Elsevier Interactive Patient Education  [employer].

## 2017-10-18 NOTE — Progress Notes (Signed)
Location:  [facility] clinic Provider: Choua Chalker L. Mariea Clonts, D.O., C.M.D.  Code Status: full code Goals of Care:  Advanced Directives 05/10/2017  Does Patient Have a Medical Advance Directive? No  Would patient like information on creating a medical advance directive? No - Patient declined   Chief Complaint  Patient presents with  . Acute Visit    blood in stool, cold    HPI: Patient is a 77 y.o. female seen today for an acute visit for blood in stool once before Christmas.  No pain now and does not recall pain then.  She started to use her suppositories, anucort.  Had bright red blood and looked like something in it--may have been a blood clot.  She may have been lacking her greens and a bit constipated.    No dizziness or lightheadedness or weakness.    She's been wheezing.  She's trying to put it on the flu shot.  She's had a cold and has started wheezing.  Has been taking generic tylenol cold.  Started this week.  Some cough.  No sore throat.  Sore under tongue.  Congested in head and chest.  She was really hot after her shower in the morning.  She has continued to be very active as usual.    Past Medical History:  Diagnosis Date  . Arthritis    Ankle  . Asymptomatic varicose veins   . Benign neoplasm of colon   . Bilateral shoulder pain 07/02/14  . Cervicitis and endocervicitis 09/13/2011  . Disorder of bone and cartilage, unspecified   . Disorders of bursae and tendons in shoulder region, unspecified   . External hemorrhoids without mention of complication   . Fibrocystic breast   . Generalized osteoarthrosis, unspecified site   . GERD (gastroesophageal reflux disease)   . Hiatal hernia   . Hypertension   . Impacted cerumen 11/07/2011  . Obesity, unspecified   . Other diseases of nasal cavity and sinuses(478.19)   . Pain in joint, ankle and foot   . Pain in joint, pelvic region and thigh   . Pain in joint, shoulder region   . Reflux esophagitis   . Unspecified essential  hypertension   . Unspecified vitamin D deficiency     Past Surgical History:  Procedure Laterality Date  . COLONOSCOPY  2006   Dr.Orr  . COLONOSCOPY  07/08/2013   Dr. Carlean Purl  . [place]   Hemorrhoids   . MOUTH SURGERY  2005   DR LUTINS  . SHOULDER ARTHROSCOPY W/ ACROMIAL REPAIR Right 07/02/14   Dr. Durward Fortes    No Known Allergies  Outpatient Encounter Medications as of 10/18/2017  Medication Sig  . ANUCORT-HC 25 MG suppository INSERT 1 SUPPOSITORY PER RECTUM ONCE DAILY FOR HEMORRHOIDS.  Marland Kitchen aspirin 81 MG tablet Take 81 mg by mouth daily. Take 1 tablet once a day.  . Calcium Carbonate-Vitamin D (CALCIUM-VITAMIN D) 500-200 MG-UNIT per tablet Take 1 tablet by mouth 2 (two) times daily. Take 1 tablet twice a day to help bones.  . Carboxymethylcell-Hypromellose (GENTEAL) 0.25-0.3 % GEL Apply 1 application to eye daily. Use 1 application to each eye as needed to alleviate irritation.  . Cholecalciferol (VITAMIN D-3 PO) Take 1 tablet by mouth daily.   . diclofenac sodium (VOLTAREN) 1 % GEL Apply 4 g topically 4 (four) times daily. To left knee  . IBU 800 MG tablet TAKE (1) TABLET FOUR TIMES DAILY AS NEEDED FOR PAIN.  Marland Kitchen losartan-hydrochlorothiazide (HYZAAR) 50-12.5 MG tablet  TAKE 1 TABLET DAILY FOR BLOOD PRESSURE.  . vitamin B-12 (CYANOCOBALAMIN) 1000 MCG tablet Take 1,000 mcg by mouth daily. Take 1 tablet once a day.  . vitamin C (ASCORBIC ACID) 500 MG tablet Take 500 mg by mouth daily. Take 1 tablet once a day.  . vitamin E 400 UNIT capsule Take 400 Units by mouth daily. Take 1 capsule once a day.   No facility-administered encounter medications on file as of 10/18/2017.     Review of Systems:  Review of Systems  Constitutional: Positive for chills. Negative for fever and malaise/fatigue.  HENT: Positive for congestion. Negative for sinus pain and sore throat.   Eyes: Negative for blurred vision.  Respiratory: Positive for cough, sputum production and wheezing.  Negative for hemoptysis, shortness of breath and stridor.   Cardiovascular: Negative for chest pain, palpitations and leg swelling.  Gastrointestinal: Positive for blood in stool and constipation. Negative for abdominal pain, diarrhea, heartburn, melena, nausea and vomiting.       Hemorrhoids  Genitourinary: Negative for dysuria.  Musculoskeletal: Negative for falls.  Skin: Negative for itching and rash.  Neurological: Negative for dizziness, loss of consciousness and weakness.  Endo/Heme/Allergies: Does not bruise/bleed easily.  Psychiatric/Behavioral: Negative for depression and memory loss.    Health Maintenance  Topic Date Due  . TETANUS/TDAP  04/30/2008  . OPHTHALMOLOGY EXAM  11/16/2017  . HEMOGLOBIN A1C  03/14/2018  . PNA vac Low Risk Adult (2 of 2 - PPSV23) 05/10/2018  . COLONOSCOPY  07/08/2018  . FOOT EXAM  09/17/2018  . DEXA SCAN  Completed    Physical Exam: Vitals:   10/18/17 1036  BP: 120/80  Pulse: 88  Temp: 98.3 F (36.8 C)  TempSrc: Oral  Weight: 238 lb (108 kg)   Body mass index is 40.85 kg/m. Physical Exam  Constitutional: She is oriented to person, place, and time. She appears well-developed and well-nourished. No distress.  Cardiovascular: Normal rate, regular rhythm, normal heart sounds and intact distal pulses.  Pulmonary/Chest: Effort normal and breath sounds normal. She has no wheezes.  Not wheezing when I listened to her   Abdominal: Soft. Bowel sounds are normal. She exhibits no distension and no mass. There is no tenderness. There is no rebound and no guarding.  Genitourinary: Rectal exam shows guaiac negative stool.  Genitourinary Comments: External hemorrhoids w/o evidence of bleeding or thrombosis at present, nontender, light brown stool in rectal vault was heme negative  Musculoskeletal: Normal range of motion.  Neurological: She is alert and oriented to person, place, and time.  Skin: Skin is warm and dry. Capillary refill takes less than 2  seconds.  Psychiatric: She has a normal mood and affect.    Labs reviewed: Basic Metabolic Panel: Recent Labs    11/06/16 0908 05/08/17 0812 09/13/17 1038  NA 143 141 141  K 4.1 3.5 3.8  CL 108 105 106  CO2 25 25 25   GLUCOSE 98 102* 105*  BUN 20 14 17   CREATININE 0.95* 1.07* 0.97*  CALCIUM 9.1 9.3 9.4   Liver Function Tests: Recent Labs    11/06/16 0908 05/08/17 0812 09/13/17 1038  AST 42* 20 28  ALT 28 11 16   ALKPHOS 67 84  --   BILITOT 0.4 0.6 0.6  PROT 5.9* 6.2 6.3  ALBUMIN 3.8 4.2  --    No results for input(s): LIPASE, AMYLASE in the last 8760 hours. No results for input(s): AMMONIA in the last 8760 hours. CBC: Recent Labs    09/13/17 1038  WBC 6.7  NEUTROABS 3,196  HGB 12.6  HCT 36.9  MCV 91.1  PLT 211   Lipid Panel: Recent Labs    05/08/17 0812 09/13/17 1038  CHOL 188 200*  HDL 59 65  LDLCALC 116*  --   TRIG 67 57  CHOLHDL 3.2 3.1   Lab Results  Component Value Date   HGBA1C 5.9 (H) 09/13/2017    Assessment/Plan 1. Bleeding external hemorrhoids -one time episode in December, none since -cont anucort supps when irritated, bleeding, itching; may also use cream  2. Acute viral bronchitis -conservative mgt reviewed and directions provided in pt instructions -push fluids, vaporizer, mucinex, cont tylenol cold generic  3. Aphthous ulcer of tongue -left side of tongue, also points toward viral infection  Labs/tests ordered:  No new Next appt:  01/17/2018 keep as scheduled  Keierra Nudo L. Jerel Sardina, D.O. [facility] [address], [place] [address] Cell Phone (Mon-Fri 8am-5pm):  [phone] On Call:  [phone] & follow prompts after 5pm & weekends Office Phone:  [phone] Office Fax:  [phone]

## 2017-10-29 ENCOUNTER — Other Ambulatory Visit: Payer: Self-pay

## 2017-10-29 ENCOUNTER — Other Ambulatory Visit: Payer: Self-pay | Admitting: Internal Medicine

## 2017-10-29 DIAGNOSIS — I10 Essential (primary) hypertension: Secondary | ICD-10-CM

## 2017-10-29 MED ORDER — LOSARTAN POTASSIUM-HCTZ 50-12.5 MG PO TABS: 1.0000 | ORAL_TABLET | Freq: Every day | ORAL | 1 refills | Status: DC

## 2018-01-17 ENCOUNTER — Other Ambulatory Visit: Payer: Medicare Other

## 2018-01-17 ENCOUNTER — Other Ambulatory Visit: Payer: Self-pay

## 2018-01-17 DIAGNOSIS — I10 Essential (primary) hypertension: Secondary | ICD-10-CM

## 2018-01-17 DIAGNOSIS — Z6841 Body Mass Index (BMI) 40.0 and over, adult: Principal | ICD-10-CM

## 2018-01-17 DIAGNOSIS — E785 Hyperlipidemia, unspecified: Secondary | ICD-10-CM

## 2018-01-17 DIAGNOSIS — E1122 Type 2 diabetes mellitus with diabetic chronic kidney disease: Secondary | ICD-10-CM

## 2018-01-17 DIAGNOSIS — N181 Chronic kidney disease, stage 1: Secondary | ICD-10-CM

## 2018-01-18 LAB — COMPLETE METABOLIC PANEL WITH GFR
AG Ratio: 2.1 (calc) (ref 1.0–2.5)
ALT: 15 U/L (ref 6–29)
AST: 31 U/L (ref 10–35)
Albumin: 4.2 g/dL (ref 3.6–5.1)
Alkaline phosphatase (APISO): 74 U/L (ref 33–130)
BUN/Creatinine Ratio: 16 (calc) (ref 6–22)
BUN: 16 mg/dL (ref 7–25)
CO2: 28 mmol/L (ref 20–32)
Calcium: 9.5 mg/dL (ref 8.6–10.4)
Chloride: 107 mmol/L (ref 98–110)
Creat: 1.02 mg/dL — ABNORMAL HIGH (ref 0.60–0.93)
GFR, Est African American: 62 mL/min/{1.73_m2} (ref >=60)
GFR, Est Non African American: 54 mL/min/{1.73_m2} — ABNORMAL LOW (ref >=60)
Globulin: 2 g/dL (calc) (ref 1.9–3.7)
Glucose, Bld: 100 mg/dL — ABNORMAL HIGH (ref 65–99)
Potassium: 4.4 mmol/L (ref 3.5–5.3)
Sodium: 142 mmol/L (ref 135–146)
Total Bilirubin: 0.7 mg/dL (ref 0.2–1.2)
Total Protein: 6.2 g/dL (ref 6.1–8.1)

## 2018-01-18 LAB — CBC WITH DIFFERENTIAL/PLATELET
Basophils Absolute: 27 cells/uL (ref 0–200)
Basophils Relative: 0.4 %
Eosinophils Absolute: 168 cells/uL (ref 15–500)
Eosinophils Relative: 2.5 %
HCT: 35.8 % (ref 35.0–45.0)
Hemoglobin: 12.2 g/dL (ref 11.7–15.5)
Lymphs Abs: 2425 cells/uL (ref 850–3900)
MCH: 30.7 pg (ref 27.0–33.0)
MCHC: 34.1 g/dL (ref 32.0–36.0)
MCV: 89.9 fL (ref 80.0–100.0)
MPV: 10.2 fL (ref 7.5–12.5)
Monocytes Relative: 8.8 %
Neutro Abs: 3491 cells/uL (ref 1500–7800)
Neutrophils Relative %: 52.1 %
Platelets: 218 10*3/uL (ref 140–400)
RBC: 3.98 10*6/uL (ref 3.80–5.10)
RDW: 13.6 % (ref 11.0–15.0)
Total Lymphocyte: 36.2 %
WBC mixed population: 590 cells/uL (ref 200–950)
WBC: 6.7 10*3/uL (ref 3.8–10.8)

## 2018-01-18 LAB — LIPID PANEL
Cholesterol: 193 mg/dL (ref <200)
HDL: 54 mg/dL (ref >50)
LDL Cholesterol (Calc): 125 mg/dL (calc) — ABNORMAL HIGH
Non-HDL Cholesterol (Calc): 139 mg/dL (calc) — ABNORMAL HIGH (ref <130)
Total CHOL/HDL Ratio: 3.6 (calc) (ref <5.0)
Triglycerides: 57 mg/dL (ref <150)

## 2018-01-18 LAB — HEMOGLOBIN A1C
Hgb A1c MFr Bld: 6.2 % of total Hgb — ABNORMAL HIGH (ref <5.7)
Mean Plasma Glucose: 131 (calc)
eAG (mmol/L): 7.3 (calc)

## 2018-01-21 ENCOUNTER — Encounter: Payer: Self-pay | Admitting: Internal Medicine

## 2018-01-21 ENCOUNTER — Ambulatory Visit (INDEPENDENT_AMBULATORY_CARE_PROVIDER_SITE_OTHER): Payer: Medicare Other | Admitting: Internal Medicine

## 2018-01-21 VITALS — BP 120/70 | HR 68 | Temp 97.9°F | Ht 64.0 in | Wt 238.0 lb

## 2018-01-21 DIAGNOSIS — E785 Hyperlipidemia, unspecified: Secondary | ICD-10-CM

## 2018-01-21 DIAGNOSIS — E1122 Type 2 diabetes mellitus with diabetic chronic kidney disease: Secondary | ICD-10-CM

## 2018-01-21 DIAGNOSIS — N181 Chronic kidney disease, stage 1: Secondary | ICD-10-CM

## 2018-01-21 DIAGNOSIS — I10 Essential (primary) hypertension: Secondary | ICD-10-CM

## 2018-01-21 DIAGNOSIS — M1712 Unilateral primary osteoarthritis, left knee: Secondary | ICD-10-CM

## 2018-01-21 DIAGNOSIS — Z6841 Body Mass Index (BMI) 40.0 and over, adult: Secondary | ICD-10-CM

## 2018-01-21 NOTE — Progress Notes (Signed)
Location:  [facility] clinic Provider:  Adaijah Endres L. Mariea Clonts, D.O., C.M.D.  Code Status: full code Goals of Care:  Advanced Directives 01/21/2018  Does Patient Have a Medical Advance Directive? No  Would patient like information on creating a medical advance directive? No - Patient declined     Chief Complaint  Patient presents with  . Medical Management of Chronic Issues    62mth follow-up, discuss labs  . ACP    none    HPI: Patient is a 77 y.o. female seen today for medical management of chronic diseases.    Her husband is at the hospital with GI bleeding right now.  She had just made sure he got an appt here b/c he'd been acting funny for the past week.  She is stressed about him.  BP is at goal.   Says she's not losing weight like she should.  Weight the same as January.  She only started back walking in the past month--only exercise prior to that was walking at work [employer]).    She's going to cut grass today.  Is walking again for the past month approximately.  She says she is going to cut down on sweets and starchy food.  Knee is better.  Had not been walking b/c of it.    Reviewed labs with here with cholesterol and sugar both trending upward.  She knows it's been primarily her immobility, but also will work on diet choices.  Past Medical History:  Diagnosis Date  . Arthritis    Ankle  . Asymptomatic varicose veins   . Benign neoplasm of colon   . Bilateral shoulder pain 07/02/14  . Cervicitis and endocervicitis 09/13/2011  . Disorder of bone and cartilage, unspecified   . Disorders of bursae and tendons in shoulder region, unspecified   . External hemorrhoids without mention of complication   . Fibrocystic breast   . Generalized osteoarthrosis, unspecified site   . GERD (gastroesophageal reflux disease)   . Hiatal hernia   . Hypertension   . Impacted cerumen 11/07/2011  . Obesity, unspecified   . Other diseases of nasal cavity and sinuses(478.19)   . Pain in joint,  ankle and foot   . Pain in joint, pelvic region and thigh   . Pain in joint, shoulder region   . Reflux esophagitis   . Unspecified essential hypertension   . Unspecified vitamin D deficiency     Past Surgical History:  Procedure Laterality Date  . COLONOSCOPY  2006   Dr.Orr  . COLONOSCOPY  07/08/2013   Dr. Carlean Purl  . [place]   Hemorrhoids   . MOUTH SURGERY  2005   DR LUTINS  . SHOULDER ARTHROSCOPY W/ ACROMIAL REPAIR Right 07/02/14   Dr. Durward Fortes    No Known Allergies  Outpatient Encounter Medications as of 01/21/2018  Medication Sig  . ANUCORT-HC 25 MG suppository INSERT 1 SUPPOSITORY PER RECTUM ONCE DAILY FOR HEMORRHOIDS.  Marland Kitchen aspirin 81 MG tablet Take 81 mg by mouth daily. Take 1 tablet once a day.  . Calcium Carbonate-Vitamin D (CALCIUM-VITAMIN D) 500-200 MG-UNIT per tablet Take 1 tablet by mouth 2 (two) times daily. Take 1 tablet twice a day to help bones.  . Carboxymethylcell-Hypromellose (GENTEAL) 0.25-0.3 % GEL Apply 1 application to eye daily. Use 1 application to each eye as needed to alleviate irritation.  . Cholecalciferol (VITAMIN D-3 PO) Take 1 tablet by mouth daily.   . diclofenac sodium (VOLTAREN) 1 % GEL Apply 4 g topically  4 (four) times daily. To left knee  . IBU 800 MG tablet TAKE (1) TABLET FOUR TIMES DAILY AS NEEDED FOR PAIN.  Marland Kitchen losartan-hydrochlorothiazide (HYZAAR) 50-12.5 MG tablet Take 1 tablet by mouth daily. for blood pressure  . vitamin B-12 (CYANOCOBALAMIN) 1000 MCG tablet Take 1,000 mcg by mouth daily. Take 1 tablet once a day.  . vitamin C (ASCORBIC ACID) 500 MG tablet Take 500 mg by mouth daily. Take 1 tablet once a day.  . vitamin E 400 UNIT capsule Take 400 Units by mouth daily. Take 1 capsule once a day.   No facility-administered encounter medications on file as of 01/21/2018.     Review of Systems:  Review of Systems  Constitutional: Negative for chills, fever and malaise/fatigue.  HENT: Negative for hearing loss.     Eyes: Negative for blurred vision.  Respiratory: Negative for cough and shortness of breath.   Cardiovascular: Negative for chest pain, palpitations and leg swelling.  Gastrointestinal: Negative for abdominal pain, blood in stool, constipation and melena.  Genitourinary: Negative for dysuria, frequency and urgency.  Musculoskeletal: Positive for joint pain. Negative for falls.       Improved  Skin: Negative for itching and rash.  Neurological: Negative for dizziness, tingling, sensory change and loss of consciousness.  Endo/Heme/Allergies: Does not bruise/bleed easily.  Psychiatric/Behavioral: Negative for depression and memory loss. The patient is not nervous/anxious.     Health Maintenance  Topic Date Due  . TETANUS/TDAP  04/30/2008  . OPHTHALMOLOGY EXAM  11/16/2017  . PNA vac Low Risk Adult (2 of 2 - PPSV23) 05/10/2018  . COLONOSCOPY  07/08/2018  . HEMOGLOBIN A1C  07/19/2018  . FOOT EXAM  09/17/2018  . DEXA SCAN  Completed    Physical Exam: Vitals:   01/21/18 1113  BP: 120/70  Pulse: 68  Temp: 97.9 F (36.6 C)  TempSrc: Oral  Weight: 238 lb (108 kg)  Height: 5\' 4"  (1.626 m)   Body mass index is 40.85 kg/m. Physical Exam  Constitutional: She is oriented to person, place, and time. She appears well-developed and well-nourished. No distress.  Cardiovascular: Normal rate, regular rhythm, normal heart sounds and intact distal pulses.  Pulmonary/Chest: Effort normal and breath sounds normal. No respiratory distress.  Abdominal: Bowel sounds are normal.  Musculoskeletal: Normal range of motion.  Neurological: She is alert and oriented to person, place, and time. No cranial nerve deficit.  Skin: Skin is warm and dry.  Psychiatric: She has a normal mood and affect.    Labs reviewed: Basic Metabolic Panel: Recent Labs    05/08/17 0812 09/13/17 1038 01/17/18 0910  NA 141 141 142  K 3.5 3.8 4.4  CL 105 106 107  CO2 25 25 28   GLUCOSE 102* 105* 100*  BUN 14 17 16    CREATININE 1.07* 0.97* 1.02*  CALCIUM 9.3 9.4 9.5   Liver Function Tests: Recent Labs    05/08/17 0812 09/13/17 1038 01/17/18 0910  AST 20 28 31   ALT 11 16 15   ALKPHOS 84  --   --   BILITOT 0.6 0.6 0.7  PROT 6.2 6.3 6.2  ALBUMIN 4.2  --   --    No results for input(s): LIPASE, AMYLASE in the last 8760 hours. No results for input(s): AMMONIA in the last 8760 hours. CBC: Recent Labs    09/13/17 1038 01/17/18 0910  WBC 6.7 6.7  NEUTROABS 3,196 3,491  HGB 12.6 12.2  HCT 36.9 35.8  MCV 91.1 89.9  PLT 211 218  Lipid Panel: Recent Labs    05/08/17 0812 09/13/17 1038 01/17/18 0910  CHOL 188 200* 193  HDL 59 65 54  LDLCALC 116* 121* 125*  TRIG 67 57 57  CHOLHDL 3.2 3.1 3.6   Lab Results  Component Value Date   HGBA1C 6.2 (H) 01/17/2018    Assessment/Plan 1. Class 3 severe obesity due to excess calories with serious comorbidity and body mass index (BMI) of 45.0 to 49.9 in adult [facility]) -counseled on walking for exercise and cutting down on sweets and starchy foods to decrease calorie intake for day - Basic metabolic panel; Future - Hepatic function panel; Future - Hemoglobin A1c; Future - Lipid panel; Future  2. Hyperlipidemia, unspecified hyperlipidemia type -if cholesterol not at goal next visit, will add high intensity statin due to her diabetes - Hepatic function panel; Future - Lipid panel; Future  3. Essential hypertension -bp at goal with current therapy, no changes needed, cont same - Basic metabolic panel; Future  4. Type 2 diabetes mellitus with stage 1 chronic kidney disease, without long-term current use of insulin ([place]) -control worsened which bothered her, she will work on diet and exercise to improve this, remains in prediabetic range now, is on arb, baby asa, not on statin - Hemoglobin A1c; Future  5. Localized osteoarthritis of left knee -improved lately, continue walking may use prn tylenol and voltaren gel for pain  Labs/tests ordered:    Orders Placed This Encounter  Procedures  . Basic metabolic panel    Standing Status:   Future    Standing Expiration Date:   09/22/2018    Order Specific Question:   Has the patient fasted?    Answer:   Yes  . Hepatic function panel    Standing Status:   Future    Standing Expiration Date:   09/22/2018  . Hemoglobin A1c    Standing Status:   Future    Standing Expiration Date:   09/22/2018  . Lipid panel    Standing Status:   Future    Standing Expiration Date:   09/22/2018    Order Specific Question:   Has the patient fasted?    Answer:   Yes    Next appt:  06/17/2018 med mgt fasting labs before   Lavaughn Haberle L. Isabellah Sobocinski, D.O. [facility] [address], [place] [address] Cell Phone (Mon-Fri 8am-5pm):  [phone] On Call:  [phone] & follow prompts after 5pm & weekends Office Phone:  [phone] Office Fax:  [phone]

## 2018-01-21 NOTE — Patient Instructions (Signed)
Please bring me a copy of your living will and HCPOA.

## 2018-01-28 ENCOUNTER — Other Ambulatory Visit: Payer: Self-pay | Admitting: Internal Medicine

## 2018-01-28 DIAGNOSIS — K644 Residual hemorrhoidal skin tags: Secondary | ICD-10-CM

## 2018-01-28 DIAGNOSIS — K648 Other hemorrhoids: Principal | ICD-10-CM

## 2018-04-01 LAB — HM MAMMOGRAPHY

## 2018-04-02 ENCOUNTER — Encounter: Payer: Self-pay | Admitting: *Deleted

## 2018-04-22 ENCOUNTER — Other Ambulatory Visit: Payer: Self-pay | Admitting: Internal Medicine

## 2018-04-22 DIAGNOSIS — I10 Essential (primary) hypertension: Secondary | ICD-10-CM

## 2018-06-13 ENCOUNTER — Ambulatory Visit (INDEPENDENT_AMBULATORY_CARE_PROVIDER_SITE_OTHER): Payer: Medicare Other

## 2018-06-13 VITALS — BP 134/78 | HR 72 | Temp 97.6°F | Ht 64.0 in | Wt 238.0 lb

## 2018-06-13 DIAGNOSIS — E1122 Type 2 diabetes mellitus with diabetic chronic kidney disease: Secondary | ICD-10-CM

## 2018-06-13 DIAGNOSIS — E785 Hyperlipidemia, unspecified: Secondary | ICD-10-CM | POA: Diagnosis not present

## 2018-06-13 DIAGNOSIS — N181 Chronic kidney disease, stage 1: Secondary | ICD-10-CM

## 2018-06-13 DIAGNOSIS — Z23 Encounter for immunization: Secondary | ICD-10-CM

## 2018-06-13 DIAGNOSIS — Z0000 Encounter for general adult medical examination without abnormal findings: Secondary | ICD-10-CM

## 2018-06-13 DIAGNOSIS — E66813 Obesity, class 3: Secondary | ICD-10-CM

## 2018-06-13 DIAGNOSIS — Z6841 Body Mass Index (BMI) 40.0 and over, adult: Secondary | ICD-10-CM

## 2018-06-13 DIAGNOSIS — I10 Essential (primary) hypertension: Secondary | ICD-10-CM

## 2018-06-13 MED ORDER — TETANUS-DIPHTH-ACELL PERTUSSIS 5-2.5-18.5 LF-MCG/0.5 IM SUSP: 0.5000 mL | Freq: Once | INTRAMUSCULAR | 0 refills | Status: AC

## 2018-06-13 NOTE — Patient Instructions (Signed)
Ms. Decarli , Thank you for taking time to come for your Medicare Wellness Visit. I appreciate your ongoing commitment to your health goals. Please review the following plan we discussed and let me know if I can assist you in the future.   Screening recommendations/referrals: Colonoscopy up to date, last completed 07/08/2013 Mammogram up to date, last completed 04/02/2018 Bone Density up to date Recommended yearly ophthalmology/optometry visit for glaucoma screening and checkup Recommended yearly dental visit for hygiene and checkup  Vaccinations: Influenza vaccine due, please get during your Monday appointment Pneumococcal vaccine 23 given today, completed Tdap vaccine due, prescription sent to pharmacy Shingles vaccine due    Advanced directives: Please bring [place] a copy of your living will and health care power of attorney  Conditions/risks identified: None  Next appointment: Dr. Mariea Clonts 06/17/2018 @ 1pm                       Tyson Dense, RN 06/19/2019 @ 9:00am   Preventive Care 65 Years and Older, Female Preventive care refers to lifestyle choices and visits with your health care provider that can promote health and wellness. What does preventive care include?  A yearly physical exam. This is also called an annual well check.  Dental exams once or twice a year.  Routine eye exams. Ask your health care provider how often you should have your eyes checked.  Personal lifestyle choices, including:  Daily care of your teeth and gums.  Regular physical activity.  Eating a healthy diet.  Avoiding tobacco and drug use.  Limiting alcohol use.  Practicing safe sex.  Taking low-dose aspirin every day.  Taking vitamin and mineral supplements as recommended by your health care provider. What happens during an annual well check? The services and screenings done by your health care provider during your annual well check will depend on your age, overall health, lifestyle risk  factors, and family history of disease. Counseling  Your health care provider may ask you questions about your:  Alcohol use.  Tobacco use.  Drug use.  Emotional well-being.  Home and relationship well-being.  Sexual activity.  Eating habits.  History of falls.  Memory and ability to understand (cognition).  Work and work Statistician.  Reproductive health. Screening  You may have the following tests or measurements:  Height, weight, and BMI.  Blood pressure.  Lipid and cholesterol levels. These may be checked every 5 years, or more frequently if you are over 69 years old.  Skin check.  Lung cancer screening. You may have this screening every year starting at age 30 if you have a 30-pack-year history of smoking and currently smoke or have quit within the past 15 years.  Fecal occult blood test (FOBT) of the stool. You may have this test every year starting at age 31.  Flexible sigmoidoscopy or colonoscopy. You may have a sigmoidoscopy every 5 years or a colonoscopy every 10 years starting at age 48.  Hepatitis C blood test.  Hepatitis B blood test.  Sexually transmitted disease (STD) testing.  Diabetes screening. This is done by checking your blood sugar (glucose) after you have not eaten for a while (fasting). You may have this done every 1-3 years.  Bone density scan. This is done to screen for osteoporosis. You may have this done starting at age 29.  Mammogram. This may be done every 1-2 years. Talk to your health care provider about how often you should have regular mammograms. Talk with your health  care provider about your test results, treatment options, and if necessary, the need for more tests. Vaccines  Your health care provider may recommend certain vaccines, such as:  Influenza vaccine. This is recommended every year.  Tetanus, diphtheria, and acellular pertussis (Tdap, Td) vaccine. You may need a Td booster every 10 years.  Zoster vaccine. You  may need this after age 55.  Pneumococcal 13-valent conjugate (PCV13) vaccine. One dose is recommended after age 64.  Pneumococcal polysaccharide (PPSV23) vaccine. One dose is recommended after age 1. Talk to your health care provider about which screenings and vaccines you need and how often you need them. This information is not intended to replace advice given to you by your health care provider. Make sure you discuss any questions you have with your health care provider. Document Released: 10/15/2015 Document Revised: 06/07/2016 Document Reviewed: 07/20/2015 Elsevier Interactive Patient Education  2017 [place] Prevention in the Home Falls can cause injuries. They can happen to people of all ages. There are many things you can do to make your home safe and to help prevent falls. What can I do on the outside of my home?  Regularly fix the edges of walkways and driveways and fix any cracks.  Remove anything that might make you trip as you walk through a door, such as a raised step or threshold.  Trim any bushes or trees on the path to your home.  Use bright outdoor lighting.  Clear any walking paths of anything that might make someone trip, such as rocks or tools.  Regularly check to see if handrails are loose or broken. Make sure that both sides of any steps have handrails.  Any raised decks and porches should have guardrails on the edges.  Have any leaves, snow, or ice cleared regularly.  Use sand or salt on walking paths during winter.  Clean up any spills in your garage right away. This includes oil or grease spills. What can I do in the bathroom?  Use night lights.  Install grab bars by the toilet and in the tub and shower. Do not use towel bars as grab bars.  Use non-skid mats or decals in the tub or shower.  If you need to sit down in the shower, use a plastic, non-slip stool.  Keep the floor dry. Clean up any water that spills on the floor as soon as  it happens.  Remove soap buildup in the tub or shower regularly.  Attach bath mats securely with double-sided non-slip rug tape.  Do not have throw rugs and other things on the floor that can make you trip. What can I do in the bedroom?  Use night lights.  Make sure that you have a light by your bed that is easy to reach.  Do not use any sheets or blankets that are too big for your bed. They should not hang down onto the floor.  Have a firm chair that has side arms. You can use this for support while you get dressed.  Do not have throw rugs and other things on the floor that can make you trip. What can I do in the kitchen?  Clean up any spills right away.  Avoid walking on wet floors.  Keep items that you use a lot in easy-to-reach places.  If you need to reach something above you, use a strong step stool that has a grab bar.  Keep electrical cords out of the way.  Do not use floor  polish or wax that makes floors slippery. If you must use wax, use non-skid floor wax.  Do not have throw rugs and other things on the floor that can make you trip. What can I do with my stairs?  Do not leave any items on the stairs.  Make sure that there are handrails on both sides of the stairs and use them. Fix handrails that are broken or loose. Make sure that handrails are as long as the stairways.  Check any carpeting to make sure that it is firmly attached to the stairs. Fix any carpet that is loose or worn.  Avoid having throw rugs at the top or bottom of the stairs. If you do have throw rugs, attach them to the floor with carpet tape.  Make sure that you have a light switch at the top of the stairs and the bottom of the stairs. If you do not have them, ask someone to add them for you. What else can I do to help prevent falls?  Wear shoes that:  Do not have high heels.  Have rubber bottoms.  Are comfortable and fit you well.  Are closed at the toe. Do not wear sandals.  If  you use a stepladder:  Make sure that it is fully opened. Do not climb a closed stepladder.  Make sure that both sides of the stepladder are locked into place.  Ask someone to hold it for you, if possible.  Clearly mark and make sure that you can see:  Any grab bars or handrails.  First and last steps.  Where the edge of each step is.  Use tools that help you move around (mobility aids) if they are needed. These include:  Canes.  Walkers.  Scooters.  Crutches.  Turn on the lights when you go into a dark area. Replace any light bulbs as soon as they burn out.  Set up your furniture so you have a clear path. Avoid moving your furniture around.  If any of your floors are uneven, fix them.  If there are any pets around you, be aware of where they are.  Review your medicines with your doctor. Some medicines can make you feel dizzy. This can increase your chance of falling. Ask your doctor what other things that you can do to help prevent falls. This information is not intended to replace advice given to you by your health care provider. Make sure you discuss any questions you have with your health care provider. Document Released: 07/15/2009 Document Revised: 02/24/2016 Document Reviewed: 10/23/2014 Elsevier Interactive Patient Education  2017 [employer].

## 2018-06-13 NOTE — Progress Notes (Signed)
Subjective:   Danielle Pierce is a 77 y.o. female who presents for an Initial Medicare Annual Wellness Visit      Objective:    Today's Vitals   06/13/18 0834  BP: 134/78  Pulse: 72  Temp: 97.6 F (36.4 C)  TempSrc: Oral  SpO2: 94%  Weight: 238 lb (108 kg)  Height: 5\' 4"  (1.626 m)   Body mass index is 40.85 kg/m.  Advanced Directives 06/13/2018 01/21/2018 05/10/2017 04/16/2017 11/08/2016 05/17/2016 01/19/2016  Does Patient Have a Medical Advance Directive? Yes No No No No No No  Type of Paramedic of [place];Living will - - - - - -  Does patient want to make changes to medical advance directive? No - Patient declined - - - - - -  Copy of [place] in Chart? No - copy requested - - - - - -  Would patient like information on creating a medical advance directive? - No - Patient declined No - Patient declined - - Yes - Educational materials given -    Current Medications (verified) Outpatient Encounter Medications as of 06/13/2018  Medication Sig  . aspirin 81 MG tablet Take 81 mg by mouth daily. Take 1 tablet once a day.  . Calcium Carbonate-Vitamin D (CALCIUM-VITAMIN D) 500-200 MG-UNIT per tablet Take 1 tablet by mouth 2 (two) times daily. Take 1 tablet twice a day to help bones.  . Carboxymethylcell-Hypromellose (GENTEAL) 0.25-0.3 % GEL Apply 1 application to eye daily. Use 1 application to each eye as needed to alleviate irritation.  . Cholecalciferol (VITAMIN D-3 PO) Take 1 tablet by mouth daily.   . diclofenac sodium (VOLTAREN) 1 % GEL Apply 4 g topically 4 (four) times daily. To left knee  . hydrocortisone (ANUSOL-HC) 25 MG suppository INSERT 1 SUPPOSITORY PER RECTUM ONCE DAILY FOR HEMORRHOIDS.  . IBU 800 MG tablet TAKE (1) TABLET FOUR TIMES DAILY AS NEEDED FOR PAIN.  . IBU 800 MG tablet TAKE (1) TABLET FOUR TIMES DAILY AS NEEDED FOR PAIN.  Marland Kitchen losartan-hydrochlorothiazide (HYZAAR) 50-12.5 MG tablet TAKE 1 TABLET DAILY FOR BLOOD  PRESSURE.  . Tdap (BOOSTRIX) 5-2.5-18.5 LF-MCG/0.5 injection Inject 0.5 mLs into the muscle once for 1 dose.  . vitamin B-12 (CYANOCOBALAMIN) 1000 MCG tablet Take 1,000 mcg by mouth daily. Take 1 tablet once a day.  . vitamin C (ASCORBIC ACID) 500 MG tablet Take 500 mg by mouth daily. Take 1 tablet once a day.  . vitamin E 400 UNIT capsule Take 400 Units by mouth daily. Take 1 capsule once a day.  . [DISCONTINUED] Tdap (BOOSTRIX) 5-2.5-18.5 LF-MCG/0.5 injection Inject 0.5 mLs into the muscle once.   No facility-administered encounter medications on file as of 06/13/2018.     Allergies (verified) Patient has no known allergies.   History: Past Medical History:  Diagnosis Date  . Arthritis    Ankle  . Asymptomatic varicose veins   . Benign neoplasm of colon   . Bilateral shoulder pain 07/02/14  . Cervicitis and endocervicitis 09/13/2011  . Disorder of bone and cartilage, unspecified   . Disorders of bursae and tendons in shoulder region, unspecified   . External hemorrhoids without mention of complication   . Fibrocystic breast   . Generalized osteoarthrosis, unspecified site   . GERD (gastroesophageal reflux disease)   . Hiatal hernia   . Hypertension   . Impacted cerumen 11/07/2011  . Obesity, unspecified   . Other diseases of nasal cavity and sinuses(478.19)   . Pain  in joint, ankle and foot   . Pain in joint, pelvic region and thigh   . Pain in joint, shoulder region   . Reflux esophagitis   . Unspecified essential hypertension   . Unspecified vitamin D deficiency    Past Surgical History:  Procedure Laterality Date  . COLONOSCOPY  2006   Dr.Orr  . COLONOSCOPY  07/08/2013   Dr. Carlean Purl  . [place]   Hemorrhoids   . MOUTH SURGERY  2005   DR LUTINS  . SHOULDER ARTHROSCOPY W/ ACROMIAL REPAIR Right 07/02/14   Dr. Durward Fortes   Family History  Problem Relation Age of Onset  . Hypertension Mother   . Kidney disease Mother        Renal failure  .  Cancer Brother        Lymphoma  . ADD / ADHD Son   . Seizures Son   . Hypertension Sister   . Dementia Sister   . Hypertension Sister   . Hypertension Sister   . Cancer - Other Sister   . Early death Brother        Some type of accident  . Colon cancer Father 53   Social History   Socioeconomic History  . Marital status: Married    Spouse name: Not on file  . Number of children: Not on file  . Years of education: Not on file  . Highest education level: Not on file  Occupational History  . Occupation: retired Landscape architect  Social Needs  . Financial resource strain: Not hard at all  . Food insecurity:    Worry: Never true    Inability: Never true  . Transportation needs:    Medical: No    Non-medical: No  Tobacco Use  . Smoking status: Never Smoker  . Smokeless tobacco: Never Used  Substance and Sexual Activity  . Alcohol use: No  . Drug use: No  . Sexual activity: Not Currently  Lifestyle  . Physical activity:    Days per week: 0 days    Minutes per session: 0 min  . Stress: To some extent  Relationships  . Social connections:    Talks on phone: More than three times a week    Gets together: More than three times a week    Attends religious service: More than 4 times per year    Active member of club or organization: No    Attends meetings of clubs or organizations: Never    Relationship status: Married  Other Topics Concern  . Not on file  Social History Narrative   Married    Never smoked   Alcohlol none   Exercise treadmill 3 times a week     Tobacco Counseling Counseling given: Not Answered   Clinical Intake:  Pre-visit preparation completed: No  Pain : No/denies pain     Nutritional Risks: None Diabetes: No  How often do you need to have someone help you when you read instructions, pamphlets, or other written materials from your doctor or pharmacy?: 1 - Never What is the last grade level you completed in school?:  Masters  Interpreter Needed?: No  Information entered by :: Tyson Dense, RN   Activities of Daily Living In your present state of health, do you have any difficulty performing the following activities: 06/13/2018  Hearing? N  Vision? N  Difficulty concentrating or making decisions? N  Walking or climbing stairs? N  Dressing or bathing? N  Doing errands, shopping?  N  Preparing Food and eating ? N  Using the Toilet? N  In the past six months, have you accidently leaked urine? N  Do you have problems with loss of bowel control? N  Managing your Medications? N  Managing your Finances? N  Housekeeping or managing your Housekeeping? N  Some recent data might be hidden     Immunizations and Health Maintenance Immunization History  Administered Date(s) Administered  . Influenza, High Dose Seasonal PF 09/17/2017  . PPD Test 10/02/1997  . Pneumococcal Conjugate-13 05/10/2017  . Pneumococcal Polysaccharide-23 06/13/2018  . Td 04/30/1998   Health Maintenance Due  Topic Date Due  . TETANUS/TDAP  04/30/2008  . OPHTHALMOLOGY EXAM  11/16/2017    Patient Care Team: Gayland Curry, DO as PCP - General (Geriatric Medicine) Garald Balding, MD as Consulting Physician (Orthopedic Surgery) Rutherford Guys, MD as Consulting Physician (Ophthalmology) Druscilla Brownie, MD as Consulting Physician (Dermatology) Gatha Mayer, MD as Consulting Physician (Gastroenterology)  Indicate any recent Medical Services you may have received from other than [facility] providers in the past year (date may be approximate).     Assessment:   This is a routine wellness examination for Danielle Pierce.  Hearing/Vision screen Hearing Screening Comments: Patient reports no issues with hearing Vision Screening Comments: Sees eye doctor annually  Dietary issues and exercise activities discussed: Current Exercise Habits: The patient does not participate in regular exercise at present, Exercise limited by: None  identified  Goals   None    Depression Screen PHQ 2/9 Scores 06/13/2018 01/21/2018 09/17/2017 05/10/2017 11/08/2016 05/17/2016 01/19/2016  PHQ - 2 Score 0 0 0 0 0 0 0    Fall Risk Fall Risk  06/13/2018 01/21/2018 09/17/2017 05/10/2017 04/16/2017  Falls in the past year? No No No No No  Number falls in past yr: - - - - -  Injury with Fall? - - - - -    Is the patient's home free of loose throw rugs in walkways, pet beds, electrical cords, etc?   yes      Grab bars in the bathroom? no      Handrails on the stairs?   yes      Adequate lighting?   yes  Cognitive Function: MMSE - Mini Mental State Exam 06/13/2018 11/08/2016  Orientation to time 5 5  Orientation to Place 5 5  Registration 3 3  Attention/ Calculation 5 5  Recall 2 2  Language- name 2 objects 2 2  Language- repeat 1 1  Language- follow 3 step command 3 3  Language- read & follow direction 1 1  Write a sentence 1 1  Copy design 0 1  Total score 28 29        Screening Tests Health Maintenance  Topic Date Due  . TETANUS/TDAP  04/30/2008  . OPHTHALMOLOGY EXAM  11/16/2017  . COLONOSCOPY  07/08/2018  . HEMOGLOBIN A1C  07/19/2018  . FOOT EXAM  09/17/2018  . DEXA SCAN  Completed  . PNA vac Low Risk Adult  Completed    Qualifies for Shingles Vaccine? Yes, educated and declined for now  Cancer Screenings: Lung: Low Dose CT Chest recommended if Age 41-80 years, 30 pack-year currently smoking OR have quit w/in 15years. Patient does not qualify. Breast: Up to date on Mammogram? Yes   Up to date of Bone Density/Dexa? Yes Colorectal: up to date  Additional Screenings:  Hepatitis C Screening: declined Flu vaccine due: requested to get it at her Monday appointment TDAP vaccine  due: prescription sent to pharmacy Pneumovax due: given today   Plan:    I have personally reviewed and addressed the Medicare Annual Wellness questionnaire and have noted the following in the patient's chart:  A. Medical and social history B. Use  of alcohol, tobacco or illicit drugs  C. Current medications and supplements D. Functional ability and status E.  Nutritional status F.  Physical activity G. Advance directives H. List of other physicians I.  Hospitalizations, surgeries, and ER visits in previous 12 months J.  [place] to include hearing, vision, cognitive, depression L. Referrals and appointments - none  In addition, I have reviewed and discussed with patient certain preventive protocols, quality metrics, and best practice recommendations. A written personalized care plan for preventive services as well as general preventive health recommendations were provided to patient.  See attached scanned questionnaire for additional information.   Signed,   Tyson Dense, RN Nurse Health Advisor  Patient Concems: None

## 2018-06-14 LAB — BASIC METABOLIC PANEL
BUN/Creatinine Ratio: 17 (calc) (ref 6–22)
BUN: 19 mg/dL (ref 7–25)
CO2: 27 mmol/L (ref 20–32)
Calcium: 9.4 mg/dL (ref 8.6–10.4)
Chloride: 109 mmol/L (ref 98–110)
Creat: 1.1 mg/dL — ABNORMAL HIGH (ref 0.60–0.93)
Glucose, Bld: 100 mg/dL — ABNORMAL HIGH (ref 65–99)
Potassium: 3.9 mmol/L (ref 3.5–5.3)
Sodium: 144 mmol/L (ref 135–146)

## 2018-06-14 LAB — HEPATIC FUNCTION PANEL
AG Ratio: 2 (calc) (ref 1.0–2.5)
ALT: 13 U/L (ref 6–29)
AST: 22 U/L (ref 10–35)
Albumin: 4 g/dL (ref 3.6–5.1)
Alkaline phosphatase (APISO): 72 U/L (ref 33–130)
Bilirubin, Direct: 0.1 mg/dL (ref 0.0–0.2)
Globulin: 2 g/dL (calc) (ref 1.9–3.7)
Indirect Bilirubin: 0.3 mg/dL (calc) (ref 0.2–1.2)
Total Bilirubin: 0.4 mg/dL (ref 0.2–1.2)
Total Protein: 6 g/dL — ABNORMAL LOW (ref 6.1–8.1)

## 2018-06-14 LAB — LIPID PANEL
Cholesterol: 182 mg/dL (ref <200)
HDL: 53 mg/dL (ref >50)
LDL Cholesterol (Calc): 115 mg/dL (calc) — ABNORMAL HIGH
Non-HDL Cholesterol (Calc): 129 mg/dL (calc) (ref <130)
Total CHOL/HDL Ratio: 3.4 (calc) (ref <5.0)
Triglycerides: 59 mg/dL (ref <150)

## 2018-06-14 LAB — HEMOGLOBIN A1C
Hgb A1c MFr Bld: 6.2 % of total Hgb — ABNORMAL HIGH (ref <5.7)
Mean Plasma Glucose: 131 (calc)
eAG (mmol/L): 7.3 (calc)

## 2018-06-17 ENCOUNTER — Ambulatory Visit (INDEPENDENT_AMBULATORY_CARE_PROVIDER_SITE_OTHER): Payer: Medicare Other | Admitting: Internal Medicine

## 2018-06-17 ENCOUNTER — Encounter: Payer: Self-pay | Admitting: *Deleted

## 2018-06-17 ENCOUNTER — Encounter: Payer: Self-pay | Admitting: Internal Medicine

## 2018-06-17 VITALS — BP 122/68 | HR 66 | Temp 98.3°F | Ht 64.0 in | Wt 240.0 lb

## 2018-06-17 DIAGNOSIS — Z23 Encounter for immunization: Secondary | ICD-10-CM

## 2018-06-17 DIAGNOSIS — N181 Chronic kidney disease, stage 1: Secondary | ICD-10-CM

## 2018-06-17 DIAGNOSIS — M1712 Unilateral primary osteoarthritis, left knee: Secondary | ICD-10-CM

## 2018-06-17 DIAGNOSIS — Z6841 Body Mass Index (BMI) 40.0 and over, adult: Secondary | ICD-10-CM

## 2018-06-17 DIAGNOSIS — E785 Hyperlipidemia, unspecified: Secondary | ICD-10-CM | POA: Diagnosis not present

## 2018-06-17 DIAGNOSIS — E1122 Type 2 diabetes mellitus with diabetic chronic kidney disease: Secondary | ICD-10-CM

## 2018-06-17 MED ORDER — IBUPROFEN 800 MG PO TABS: 800.0000 mg | ORAL_TABLET | Freq: Every day | ORAL | 0 refills | Status: DC | PRN

## 2018-06-17 NOTE — Patient Instructions (Signed)
Fat and Cholesterol Restricted Diet Getting too much fat and cholesterol in your diet may cause health problems. Following this diet helps keep your fat and cholesterol at normal levels. This can keep you from getting sick. What types of fat should I choose?  Choose monosaturated and polyunsaturated fats. These are found in foods such as olive oil, canola oil, flaxseeds, walnuts, almonds, and seeds.  Eat more omega-3 fats. Good choices include salmon, mackerel, sardines, tuna, flaxseed oil, and ground flaxseeds.  Limit saturated fats. These are in animal products such as meats, butter, and cream. They can also be in plant products such as palm oil, palm kernel oil, and coconut oil.  Avoid foods with partially hydrogenated oils in them. These contain trans fats. Examples of foods that have trans fats are stick margarine, some tub margarines, cookies, crackers, and other baked goods. What general guidelines do I need to follow?  Check food labels. Look for the words "trans fat" and "saturated fat."  When preparing a meal: ? Fill half of your plate with vegetables and green salads. ? Fill one fourth of your plate with whole grains. Look for the word "whole" as the first word in the ingredient list. ? Fill one fourth of your plate with lean protein foods.  Eat more foods that have fiber, like apples, carrots, beans, peas, and barley.  Eat more home-cooked foods. Eat less at restaurants and buffets.  Limit or avoid alcohol.  Limit foods high in starch and sugar.  Limit fried foods.  Cook foods without frying them. Baking, boiling, grilling, and broiling are all great options.  Lose weight if you are overweight. Losing even a small amount of weight can help your overall health. It can also help prevent diseases such as diabetes and heart disease. What foods can I eat? Grains Whole grains, such as whole wheat or whole grain breads, crackers, cereals, and pasta. Unsweetened oatmeal,  bulgur, barley, quinoa, or brown rice. Corn or whole wheat flour tortillas. Vegetables Fresh or frozen vegetables (raw, steamed, roasted, or grilled). Green salads. Fruits All fresh, canned (in natural juice), or frozen fruits. Meat and Other Protein Products Ground beef (85% or leaner), grass-fed beef, or beef trimmed of fat. Skinless chicken or turkey. Ground chicken or turkey. Pork trimmed of fat. All fish and seafood. Eggs. Dried beans, peas, or lentils. Unsalted nuts or seeds. Unsalted canned or dry beans. Dairy Low-fat dairy products, such as skim or 1% milk, 2% or reduced-fat cheeses, low-fat ricotta or cottage cheese, or plain low-fat yogurt. Fats and Oils Tub margarines without trans fats. Light or reduced-fat mayonnaise and salad dressings. Avocado. Olive, canola, sesame, or safflower oils. Natural peanut or almond butter (choose ones without added sugar and oil). The items listed above may not be a complete list of recommended foods or beverages. Contact your dietitian for more options. What foods are not recommended? Grains White bread. White pasta. White rice. Cornbread. Bagels, pastries, and croissants. Crackers that contain trans fat. Vegetables White potatoes. Corn. Creamed or fried vegetables. Vegetables in a cheese sauce. Fruits Dried fruits. Canned fruit in light or heavy syrup. Fruit juice. Meat and Other Protein Products Fatty cuts of meat. Ribs, chicken wings, bacon, sausage, bologna, salami, chitterlings, fatback, hot dogs, bratwurst, and packaged luncheon meats. Liver and organ meats. Dairy Whole or 2% milk, cream, half-and-half, and cream cheese. Whole milk cheeses. Whole-fat or sweetened yogurt. Full-fat cheeses. Nondairy creamers and whipped toppings. Processed cheese, cheese spreads, or cheese curds. Sweets and Desserts Corn   syrup, sugars, honey, and molasses. Candy. Jam and jelly. Syrup. Sweetened cereals. Cookies, pies, cakes, donuts, muffins, and ice  cream. Fats and Oils Butter, stick margarine, lard, shortening, ghee, or bacon fat. Coconut, palm kernel, or palm oils. Beverages Alcohol. Sweetened drinks (such as sodas, lemonade, and fruit drinks or punches). The items listed above may not be a complete list of foods and beverages to avoid. Contact your dietitian for more information. This information is not intended to replace advice given to you by your health care provider. Make sure you discuss any questions you have with your health care provider. Document Released: 03/19/2012 Document Revised: 05/25/2016 Document Reviewed: 12/18/2013 Elsevier Interactive Patient Education  2018 [employer]  

## 2018-06-17 NOTE — Progress Notes (Signed)
Location:  [facility] clinic Provider:  Samaira Holzworth L. Mariea Clonts, D.O., C.M.D.  Goals of Care:  Advanced Directives 06/13/2018  Does Patient Have a Medical Advance Directive? Yes  Type of Paramedic of [place];Living will  Does patient want to make changes to medical advance directive? No - Patient declined  Copy of [place] in Chart? No - copy requested  Would patient like information on creating a medical advance directive? -   Chief Complaint  Patient presents with  . Medical Management of Chronic Issues    5mth follow-up    HPI: Patient is a 77 y.o. female seen today for medical management of chronic diseases.    CKD:  The knee osteoarthritis is better.  She got used to taking the ibuprofen each day.  She's going to change to prn when severe.    She's not been exercising a lot.  She's not a sitter--is on her feet.  She knows she has to get back on the treadmill.  Walks a lot at [employer]   BP at goal.  It was high at her wellness visit initially, but did come down then too.    Hemorrhoids not bothering her much.  Keeps the suppositories.  Uses if painful.  Tries to eat enough veggies to keep her bowels moving.  She had her eyes examined at Dr. Gershon Crane.    Past Medical History:  Diagnosis Date  . Arthritis    Ankle  . Asymptomatic varicose veins   . Benign neoplasm of colon   . Bilateral shoulder pain 07/02/14  . Cervicitis and endocervicitis 09/13/2011  . Disorder of bone and cartilage, unspecified   . Disorders of bursae and tendons in shoulder region, unspecified   . External hemorrhoids without mention of complication   . Fibrocystic breast   . Generalized osteoarthrosis, unspecified site   . GERD (gastroesophageal reflux disease)   . Hiatal hernia   . Hypertension   . Impacted cerumen 11/07/2011  . Obesity, unspecified   . Other diseases of nasal cavity and sinuses(478.19)   . Pain in joint, ankle and foot   . Pain in  joint, pelvic region and thigh   . Pain in joint, shoulder region   . Reflux esophagitis   . Unspecified essential hypertension   . Unspecified vitamin D deficiency     Past Surgical History:  Procedure Laterality Date  . COLONOSCOPY  2006   Dr.Orr  . COLONOSCOPY  07/08/2013   Dr. Carlean Purl  . Tensed   Hemorrhoids   . MOUTH SURGERY  2005   DR LUTINS  . SHOULDER ARTHROSCOPY W/ ACROMIAL REPAIR Right 07/02/14   Dr. Durward Fortes    No Known Allergies  Outpatient Encounter Medications as of 06/17/2018  Medication Sig  . aspirin 81 MG tablet Take 81 mg by mouth daily. Take 1 tablet once a day.  . Calcium Carbonate-Vitamin D (CALCIUM-VITAMIN D) 500-200 MG-UNIT per tablet Take 1 tablet by mouth 2 (two) times daily. Take 1 tablet twice a day to help bones.  . Carboxymethylcell-Hypromellose (GENTEAL) 0.25-0.3 % GEL Apply 1 application to eye daily. Use 1 application to each eye as needed to alleviate irritation.  . Cholecalciferol (VITAMIN D-3 PO) Take 1 tablet by mouth daily.   . diclofenac sodium (VOLTAREN) 1 % GEL Apply 4 g topically 4 (four) times daily. To left knee  . hydrocortisone (ANUSOL-HC) 25 MG suppository INSERT 1 SUPPOSITORY PER RECTUM ONCE DAILY FOR HEMORRHOIDS.  Marland Kitchen IBU  800 MG tablet TAKE (1) TABLET FOUR TIMES DAILY AS NEEDED FOR PAIN.  . IBU 800 MG tablet TAKE (1) TABLET FOUR TIMES DAILY AS NEEDED FOR PAIN.  Marland Kitchen losartan-hydrochlorothiazide (HYZAAR) 50-12.5 MG tablet TAKE 1 TABLET DAILY FOR BLOOD PRESSURE.  . vitamin B-12 (CYANOCOBALAMIN) 1000 MCG tablet Take 1,000 mcg by mouth daily. Take 1 tablet once a day.  . vitamin C (ASCORBIC ACID) 500 MG tablet Take 500 mg by mouth daily. Take 1 tablet once a day.  . vitamin E 400 UNIT capsule Take 400 Units by mouth daily. Take 1 capsule once a day.   No facility-administered encounter medications on file as of 06/17/2018.     Review of Systems:  Review of Systems  Constitutional: Negative for chills, fever and  malaise/fatigue.  HENT: Negative for congestion and hearing loss.   Eyes: Negative for blurred vision.  Respiratory: Negative for cough and shortness of breath.   Cardiovascular: Negative for chest pain, palpitations and leg swelling.  Gastrointestinal: Negative for abdominal pain, blood in stool, constipation, diarrhea and melena.  Genitourinary: Negative for dysuria.  Musculoskeletal: Negative for falls and joint pain.       Knee better  Skin: Negative for itching and rash.  Neurological: Negative for dizziness and loss of consciousness.  Endo/Heme/Allergies: Does not bruise/bleed easily.  Psychiatric/Behavioral: Negative for memory loss. The patient is not nervous/anxious and does not have insomnia.     Health Maintenance  Topic Date Due  . TETANUS/TDAP  04/30/2008  . OPHTHALMOLOGY EXAM  11/16/2017  . COLONOSCOPY  07/08/2018  . FOOT EXAM  09/17/2018  . HEMOGLOBIN A1C  12/12/2018  . DEXA SCAN  Completed  . PNA vac Low Risk Adult  Completed    Physical Exam: Vitals:   06/17/18 1306  BP: 122/68  Pulse: 66  Temp: 98.3 F (36.8 C)  TempSrc: Oral  Weight: 240 lb (108.9 kg)  Height: 5\' 4"  (1.626 m)   Body mass index is 41.2 kg/m. Physical Exam  Constitutional: She is oriented to person, place, and time. She appears well-developed and well-nourished. No distress.  HENT:  Head: Normocephalic and atraumatic.  Cardiovascular: Normal rate, regular rhythm, normal heart sounds and intact distal pulses.  Pulmonary/Chest: Effort normal and breath sounds normal. No respiratory distress.  Abdominal: Bowel sounds are normal.  Musculoskeletal: Normal range of motion. She exhibits no tenderness.  Neurological: She is alert and oriented to person, place, and time.  Skin: Skin is warm and dry. Capillary refill takes less than 2 seconds.  Psychiatric: She has a normal mood and affect.    Labs reviewed: Basic Metabolic Panel: Recent Labs    09/13/17 1038 01/17/18 0910  06/13/18 0824  NA 141 142 144  K 3.8 4.4 3.9  CL 106 107 109  CO2 25 28 27   GLUCOSE 105* 100* 100*  BUN 17 16 19   CREATININE 0.97* 1.02* 1.10*  CALCIUM 9.4 9.5 9.4   Liver Function Tests: Recent Labs    09/13/17 1038 01/17/18 0910 06/13/18 0824  AST 28 31 22   ALT 16 15 13   BILITOT 0.6 0.7 0.4  PROT 6.3 6.2 6.0*   No results for input(s): LIPASE, AMYLASE in the last 8760 hours. No results for input(s): AMMONIA in the last 8760 hours. CBC: Recent Labs    09/13/17 1038 01/17/18 0910  WBC 6.7 6.7  NEUTROABS 3,196 3,491  HGB 12.6 12.2  HCT 36.9 35.8  MCV 91.1 89.9  PLT 211 218   Lipid Panel: Recent Labs  09/13/17 1038 01/17/18 0910 06/13/18 0824  CHOL 200* 193 182  HDL 65 54 53  LDLCALC 121* 125* 115*  TRIG 57 57 59  CHOLHDL 3.1 3.6 3.4   Lab Results  Component Value Date   HGBA1C 6.2 (H) 06/13/2018    Assessment/Plan 1. Body mass index (BMI) of 40.1-44.9 in adult [facility]) - reviewed importance of regular exercise and improved diet--given handout - CBC with Differential/Platelet; Future - Hemoglobin A1c; Future - Lipid panel; Future - Basic metabolic panel; Future  2. Localized osteoarthritis of left knee - cut back on ibuprofen which she's still been taking daily even though knee pain is better--was starting to affect kidneys - ibuprofen (IBU) 800 MG tablet; Take 1 tablet (800 mg total) by mouth daily as needed (if pain severe only).  Dispense: 120 tablet; Refill: 0  3. Class 3 severe obesity due to excess calories with serious comorbidity and body mass index (BMI) of 45.0 to 49.9 in adult [facility]) - ongoing, see #1, improve diet and exercise  4. Hyperlipidemia, unspecified hyperlipidemia type - does not want to take a statin, but does agree to work on diet and exercise more - Lipid panel; Future  5. Type 2 diabetes mellitus with stage 1 chronic kidney disease, without long-term current use of insulin (HCC) - cont diet and exercise regimen -  Hemoglobin A1c; Future  6. Need for influenza vaccination -flu shot given  Labs/tests ordered:  Orders Placed This Encounter  Procedures  . Flu vaccine HIGH DOSE PF (Fluzone High dose)  . CBC with Differential/Platelet    Standing Status:   Future    Standing Expiration Date:   06/18/2019  . Hemoglobin A1c    Standing Status:   Future    Standing Expiration Date:   06/18/2019  . Lipid panel    Standing Status:   Future    Standing Expiration Date:   06/18/2019    Order Specific Question:   Has the patient fasted?    Answer:   Yes  . Basic metabolic panel    Standing Status:   Future    Standing Expiration Date:   06/18/2019    Order Specific Question:   Has the patient fasted?    Answer:   Yes    Next appt:  10/17/2018 med mgt, fasting labs before   Nelsy Madonna L. Ryane Canavan, D.O. [facility] [address], [place] [address] Cell Phone (Mon-Fri 8am-5pm):  [phone] On Call:  [phone] & follow prompts after 5pm & weekends Office Phone:  [phone] Office Fax:  [phone]

## 2018-06-27 LAB — HM DEXA SCAN

## 2018-06-28 ENCOUNTER — Encounter: Payer: Self-pay | Admitting: *Deleted

## 2018-07-03 ENCOUNTER — Encounter: Payer: Self-pay | Admitting: *Deleted

## 2018-07-04 ENCOUNTER — Telehealth: Payer: Self-pay | Admitting: *Deleted

## 2018-07-04 NOTE — Telephone Encounter (Signed)
Mail box is full, will try again later

## 2018-07-04 NOTE — Telephone Encounter (Signed)
Calling with bone density results per Dr. Mariea Clonts " Osteopenia but improved from 2017, continue calcium and vit d, increase weight bearing exercise"

## 2018-07-08 ENCOUNTER — Encounter: Payer: Self-pay | Admitting: Internal Medicine

## 2018-07-08 NOTE — Telephone Encounter (Signed)
Spoke with patient and advised results   

## 2018-07-12 ENCOUNTER — Ambulatory Visit (AMBULATORY_SURGERY_CENTER): Payer: Self-pay | Admitting: *Deleted

## 2018-07-12 ENCOUNTER — Other Ambulatory Visit: Payer: Self-pay

## 2018-07-12 VITALS — Ht 63.0 in | Wt 240.2 lb

## 2018-07-12 DIAGNOSIS — Z800 Family history of malignant neoplasm of digestive organs: Secondary | ICD-10-CM

## 2018-07-12 DIAGNOSIS — Z86010 Personal history of colonic polyps: Secondary | ICD-10-CM

## 2018-07-12 NOTE — Progress Notes (Signed)
Patient denies any allergies to egg or soy products. Patient denies complications with anesthesia/sedation.  Patient denies oxygen use at home and denies diet medications. Pamphlet given on colonoscopy. 

## 2018-07-15 ENCOUNTER — Encounter: Payer: Self-pay | Admitting: Internal Medicine

## 2018-07-15 ENCOUNTER — Other Ambulatory Visit: Payer: Self-pay | Admitting: Internal Medicine

## 2018-07-15 DIAGNOSIS — I10 Essential (primary) hypertension: Secondary | ICD-10-CM

## 2018-07-25 ENCOUNTER — Ambulatory Visit (AMBULATORY_SURGERY_CENTER): Payer: Medicare Other | Admitting: Internal Medicine

## 2018-07-25 ENCOUNTER — Encounter: Payer: Self-pay | Admitting: Internal Medicine

## 2018-07-25 VITALS — BP 131/81 | HR 81 | Temp 97.8°F | Resp 15 | Ht 63.0 in | Wt 240.0 lb

## 2018-07-25 DIAGNOSIS — Z86010 Personal history of colonic polyps: Secondary | ICD-10-CM

## 2018-07-25 DIAGNOSIS — D124 Benign neoplasm of descending colon: Secondary | ICD-10-CM

## 2018-07-25 MED ORDER — SODIUM CHLORIDE 0.9 % IV SOLN: 500.0000 mL | Freq: Once | INTRAVENOUS | Status: DC

## 2018-07-25 NOTE — Progress Notes (Signed)
Pt's states no medical or surgical changes since previsit or office visit. 

## 2018-07-25 NOTE — Progress Notes (Signed)
Called to room to assist during endoscopic procedure.  Patient ID and intended procedure confirmed with present staff. Received instructions for my participation in the procedure from the performing physician.  

## 2018-07-25 NOTE — Patient Instructions (Addendum)
   I found and removed one tiny polyp.  You will not need another routine colonoscopy.  I appreciate the opportunity to care for you. Gatha Mayer, MD, FACG   YOU HAD AN ENDOSCOPIC PROCEDURE TODAY AT [facility]:   Refer to the procedure report that was given to you for any specific questions about what was found during the examination.  If the procedure report does not answer your questions, please call your gastroenterologist to clarify.  If you requested that your care partner not be given the details of your procedure findings, then the procedure report has been included in a sealed envelope for you to review at your convenience later.  YOU SHOULD EXPECT: Some feelings of bloating in the abdomen. Passage of more gas than usual.  Walking can help get rid of the air that was put into your GI tract during the procedure and reduce the bloating. If you had a lower endoscopy (such as a colonoscopy or flexible sigmoidoscopy) you may notice spotting of blood in your stool or on the toilet paper. If you underwent a bowel prep for your procedure, you may not have a normal bowel movement for a few days.  Please Note:  You might notice some irritation and congestion in your nose or some drainage.  This is from the oxygen used during your procedure.  There is no need for concern and it should clear up in a day or so.  SYMPTOMS TO REPORT IMMEDIATELY:   Following lower endoscopy (colonoscopy or flexible sigmoidoscopy):  Excessive amounts of blood in the stool  Significant tenderness or worsening of abdominal pains  Swelling of the abdomen that is new, acute  Fever of 100F or higher   For urgent or emergent issues, a gastroenterologist can be reached at any hour by calling [phone].   DIET:  We do recommend a small meal at first, but then you may proceed to your regular diet.  Drink plenty of fluids but you should avoid alcoholic beverages for 24 hours.  ACTIVITY:   You should plan to take it easy for the rest of today and you should NOT DRIVE or use heavy machinery until tomorrow (because of the sedation medicines used during the test).    FOLLOW UP: Our staff will call the number listed on your records the next business day following your procedure to check on you and address any questions or concerns that you may have regarding the information given to you following your procedure. If we do not reach you, we will leave a message.  However, if you are feeling well and you are not experiencing any problems, there is no need to return our call.  We will assume that you have returned to your regular daily activities without incident.  If any biopsies were taken you will be contacted by phone or by letter within the next 1-3 weeks.  Please call us at [phone] if you have not heard about the biopsies in 3 weeks.    SIGNATURES/CONFIDENTIALITY: You and/or your care partner have signed paperwork which will be entered into your electronic medical record.  These signatures attest to the fact that that the information above on your After Visit Summary has been reviewed and is understood.  Full responsibility of the confidentiality of this discharge information lies with you and/or your care-partner.

## 2018-07-25 NOTE — Op Note (Signed)
[facility] Patient Name: Danielle Pierce Procedure Date: 07/25/2018 9:11 AM MRN: [id number] Endoscopist: Gatha Mayer , MD Age: 77 Referring MD:  Date of Birth: 04/01/1941 Gender: Female Account #: [id number] Procedure:                Colonoscopy Indications:              Surveillance: Personal history of adenomatous                            polyps on last colonoscopy 5 years ago Medicines:                Propofol per Anesthesia, Monitored Anesthesia Care Procedure:                Pre-Anesthesia Assessment:                           - Prior to the procedure, a History and Physical                            was performed, and patient medications and                            allergies were reviewed. The patient's tolerance of                            previous anesthesia was also reviewed. The risks                            and benefits of the procedure and the sedation                            options and risks were discussed with the patient.                            All questions were answered, and informed consent                            was obtained. Prior Anticoagulants: The patient has                            taken no previous anticoagulant or antiplatelet                            agents. ASA Grade Assessment: II - A patient with                            mild systemic disease. After reviewing the risks                            and benefits, the patient was deemed in                            satisfactory condition to undergo the procedure.  After obtaining informed consent, the colonoscope                            was passed under direct vision. Throughout the                            procedure, the patient's blood pressure, pulse, and                            oxygen saturations were monitored continuously. The                            Colonoscope was introduced through the anus and     advanced to the the cecum, identified by                            appendiceal orifice and ileocecal valve. The                            colonoscopy was performed without difficulty. The                            patient tolerated the procedure well. The quality                            of the bowel preparation was excellent. The                            ileocecal valve, appendiceal orifice, and rectum                            were photographed. The bowel preparation used was                            Miralax. Scope In: 9:32:40 AM Scope Out: 9:42:32 AM Scope Withdrawal Time: 0 hours 8 minutes 23 seconds  Total Procedure Duration: 0 hours 9 minutes 52 seconds  Findings:                 The perianal and digital rectal examinations were                            normal.                           A diminutive polyp was found in the descending                            colon. The polyp was sessile. The polyp was removed                            with a cold snare. Resection and retrieval were                            complete. Verification of patient identification  for the specimen was done. Estimated blood loss was                            minimal.                           External and internal hemorrhoids were found during                            retroflexion.                           The exam was otherwise without abnormality on                            direct and retroflexion views. Complications:            No immediate complications. Estimated Blood Loss:     Estimated blood loss was minimal. Impression:               - One diminutive polyp in the descending colon,                            removed with a cold snare. Resected and retrieved.                           - External and internal hemorrhoids.                           - The examination was otherwise normal on direct                            and retroflexion views.                            - Personal history of colonic polyps. Recommendation:           - Patient has a contact number available for                            emergencies. The signs and symptoms of potential                            delayed complications were discussed with the                            patient. Return to normal activities tomorrow.                            Written discharge instructions were provided to the                            patient.                           - Resume previous diet.                           -  Continue present medications.                           - No repeat colonoscopy due to age and findings                            (diminutive polyp - no polyps 2014). Gatha Mayer, MD 07/25/2018 9:51:15 AM This report has been signed electronically.

## 2018-07-26 ENCOUNTER — Telehealth: Payer: Self-pay

## 2018-07-26 ENCOUNTER — Telehealth: Payer: Self-pay | Admitting: *Deleted

## 2018-07-26 NOTE — Telephone Encounter (Signed)
  Follow up Call-  Call back number 07/25/2018  Post procedure Call Back phone  # [phone]  Permission to leave phone message Yes  Some recent data might be hidden      No answer

## 2018-07-26 NOTE — Telephone Encounter (Signed)
No answer, left message to call if questions or concerns. 

## 2018-08-06 ENCOUNTER — Encounter: Payer: Self-pay | Admitting: Internal Medicine

## 2018-08-06 DIAGNOSIS — Z86010 Personal history of colonic polyps: Secondary | ICD-10-CM

## 2018-08-06 NOTE — Progress Notes (Signed)
Adenoma No recall - age

## 2018-08-26 ENCOUNTER — Encounter: Payer: Self-pay | Admitting: Internal Medicine

## 2018-08-26 ENCOUNTER — Telehealth: Payer: Self-pay

## 2018-08-26 DIAGNOSIS — I10 Essential (primary) hypertension: Secondary | ICD-10-CM

## 2018-08-26 MED ORDER — IRBESARTAN-HYDROCHLOROTHIAZIDE 150-12.5 MG PO TABS: 1.0000 | ORAL_TABLET | Freq: Every day | ORAL | 3 refills | Status: DC

## 2018-08-26 NOTE — Telephone Encounter (Signed)
Patient aware medication changed and sent to pharmacy

## 2018-08-26 NOTE — Telephone Encounter (Signed)
Sent irbesartan/hctz daily in place of losartan/hctz.

## 2018-08-26 NOTE — Telephone Encounter (Signed)
Patient called to state she spoke with [facility] today and Losartan is still on back order and she only has 3 pills left. Patient would like for Dr.Reed to send an alternative to [facility]   Please advise (call patient once new rx sent)

## 2018-10-14 ENCOUNTER — Other Ambulatory Visit: Payer: Medicare Other

## 2018-10-14 DIAGNOSIS — E785 Hyperlipidemia, unspecified: Secondary | ICD-10-CM

## 2018-10-14 DIAGNOSIS — E1122 Type 2 diabetes mellitus with diabetic chronic kidney disease: Secondary | ICD-10-CM

## 2018-10-14 DIAGNOSIS — N181 Chronic kidney disease, stage 1: Secondary | ICD-10-CM

## 2018-10-14 DIAGNOSIS — Z6841 Body Mass Index (BMI) 40.0 and over, adult: Secondary | ICD-10-CM

## 2018-10-15 LAB — CBC WITH DIFFERENTIAL/PLATELET
Absolute Monocytes: 635 cells/uL (ref 200–950)
Basophils Absolute: 22 cells/uL (ref 0–200)
Basophils Relative: 0.3 %
Eosinophils Absolute: 139 cells/uL (ref 15–500)
Eosinophils Relative: 1.9 %
HCT: 37.1 % (ref 35.0–45.0)
Hemoglobin: 12.7 g/dL (ref 11.7–15.5)
Lymphs Abs: 2416 cells/uL (ref 850–3900)
MCH: 31.3 pg (ref 27.0–33.0)
MCHC: 34.2 g/dL (ref 32.0–36.0)
MCV: 91.4 fL (ref 80.0–100.0)
MPV: 10.6 fL (ref 7.5–12.5)
Monocytes Relative: 8.7 %
Neutro Abs: 4088 cells/uL (ref 1500–7800)
Neutrophils Relative %: 56 %
Platelets: 210 10*3/uL (ref 140–400)
RBC: 4.06 10*6/uL (ref 3.80–5.10)
RDW: 13.8 % (ref 11.0–15.0)
Total Lymphocyte: 33.1 %
WBC: 7.3 10*3/uL (ref 3.8–10.8)

## 2018-10-15 LAB — BASIC METABOLIC PANEL
BUN/Creatinine Ratio: 13 (calc) (ref 6–22)
BUN: 13 mg/dL (ref 7–25)
CO2: 27 mmol/L (ref 20–32)
Calcium: 9.1 mg/dL (ref 8.6–10.4)
Chloride: 107 mmol/L (ref 98–110)
Creat: 0.99 mg/dL — ABNORMAL HIGH (ref 0.60–0.93)
Glucose, Bld: 107 mg/dL — ABNORMAL HIGH (ref 65–99)
Potassium: 4 mmol/L (ref 3.5–5.3)
Sodium: 143 mmol/L (ref 135–146)

## 2018-10-15 LAB — LIPID PANEL
Cholesterol: 188 mg/dL (ref <200)
HDL: 56 mg/dL (ref >50)
LDL Cholesterol (Calc): 115 mg/dL (calc) — ABNORMAL HIGH
Non-HDL Cholesterol (Calc): 132 mg/dL (calc) — ABNORMAL HIGH (ref <130)
Total CHOL/HDL Ratio: 3.4 (calc) (ref <5.0)
Triglycerides: 78 mg/dL (ref <150)

## 2018-10-15 LAB — HEMOGLOBIN A1C
Hgb A1c MFr Bld: 6.2 % of total Hgb — ABNORMAL HIGH (ref <5.7)
Mean Plasma Glucose: 131 (calc)
eAG (mmol/L): 7.3 (calc)

## 2018-10-17 ENCOUNTER — Ambulatory Visit: Payer: Medicare Other | Admitting: Internal Medicine

## 2018-10-17 ENCOUNTER — Encounter: Payer: Self-pay | Admitting: *Deleted

## 2018-11-14 ENCOUNTER — Encounter: Payer: Self-pay | Admitting: Internal Medicine

## 2018-11-14 ENCOUNTER — Ambulatory Visit (INDEPENDENT_AMBULATORY_CARE_PROVIDER_SITE_OTHER): Payer: Medicare Other | Admitting: Internal Medicine

## 2018-11-14 VITALS — BP 136/76 | HR 68 | Temp 98.0°F | Ht 63.0 in | Wt 239.0 lb

## 2018-11-14 DIAGNOSIS — N181 Chronic kidney disease, stage 1: Secondary | ICD-10-CM

## 2018-11-14 DIAGNOSIS — M1712 Unilateral primary osteoarthritis, left knee: Secondary | ICD-10-CM

## 2018-11-14 DIAGNOSIS — I10 Essential (primary) hypertension: Secondary | ICD-10-CM

## 2018-11-14 DIAGNOSIS — E785 Hyperlipidemia, unspecified: Secondary | ICD-10-CM | POA: Diagnosis not present

## 2018-11-14 DIAGNOSIS — Z6841 Body Mass Index (BMI) 40.0 and over, adult: Secondary | ICD-10-CM | POA: Diagnosis not present

## 2018-11-14 DIAGNOSIS — Z23 Encounter for immunization: Secondary | ICD-10-CM

## 2018-11-14 DIAGNOSIS — E1122 Type 2 diabetes mellitus with diabetic chronic kidney disease: Secondary | ICD-10-CM | POA: Diagnosis not present

## 2018-11-14 MED ORDER — TETANUS-DIPHTH-ACELL PERTUSSIS 5-2-15.5 LF-MCG/0.5 IM SUSP: 0.5000 mL | Freq: Once | INTRAMUSCULAR | 0 refills | Status: AC

## 2018-11-14 NOTE — Progress Notes (Signed)
Location:  [facility] clinic Provider:  Emiley Digiacomo L. Mariea Clonts, D.O., C.M.D.  Goals of Care:  Advanced Directives 06/13/2018  Does Patient Have a Medical Advance Directive? Yes  Type of Paramedic of [place];Living will  Does patient want to make changes to medical advance directive? No - Patient declined  Copy of [place] in Chart? No - copy requested  Would patient like information on creating a medical advance directive? -   Chief Complaint  Patient presents with  . Medical Management of Chronic Issues    69mth follow-up   HPI: Patient is a 78 y.o. female seen today for medical management of chronic diseases.    She thinks she is alright.  She walks back and forth with [email or web address] at [employer].  Takes the long way out when she leaves there.  Asked about eating habits and she's struggling there.  She is trying not to eat fried foods.  She was going to bake the chicken and Toula Moos wanted it fried.  If she has fried it's b/c they've split a fast food box.  She does keep her greens and veggies.  He gets tired of the greens.  When she leaves, he goes around the kitchen and eats what he should not.  She gets stir-fried veggies and rice.  He microwaved and ate one yesterday which was not intended for him.  She has been hydrating and has stopped taking her 800mg  motrin.  Was taking them like vitamins before.  BP was up a little today.  She had an episode with him before leaving this morning.  He had his depend on wrong overnight and made a mess.    She says he manages at home himself.  She usually leaves him food out but he stays hungry.  She will get cookies to where she eats one once in a while--he ate almost the whole box and then turned them.    Her husband goes for tests for metastasis on Monday.  He also has an appt to f/u on his ascites.    He had been calling her a lot.  Would want her to go get fried food on the way home.    She just had a friend who had  the shingles.  It kept her from going to a gala.   She is still thinking if she wants the vaccine herself.    Wants to work on her diet again to lower her cholesterol rather than taking meds.  Past Medical History:  Diagnosis Date  . Arthritis    Ankle  . Asymptomatic varicose veins   . Benign neoplasm of colon   . Bilateral shoulder pain 07/02/14  . Cataract    Bilateral - just watching  . Cervicitis and endocervicitis 09/13/2011  . Disorder of bone and cartilage, unspecified   . Disorders of bursae and tendons in shoulder region, unspecified   . External hemorrhoids without mention of complication   . Fibrocystic breast   . Generalized osteoarthrosis, unspecified site   . GERD (gastroesophageal reflux disease)    diet controlled, No meds  . Hiatal hernia   . Hypertension   . Impacted cerumen 11/07/2011  . Obesity, unspecified   . Other diseases of nasal cavity and sinuses(478.19)   . Pain in joint, ankle and foot   . Pain in joint, pelvic region and thigh   . Pain in joint, shoulder region   . Reflux esophagitis   . SVD (spontaneous vaginal delivery)  x 2  . Unspecified essential hypertension   . Unspecified vitamin D deficiency     Past Surgical History:  Procedure Laterality Date  . COLONOSCOPY  2006   Dr.Orr  . COLONOSCOPY  07/08/2013   Dr. Carlean Purl  . [place]   Hemorrhoids   . MOUTH SURGERY  2005   DR LUTINS - tooth ext and gum surgery  . SHOULDER ARTHROSCOPY W/ ACROMIAL REPAIR Right 07/02/14   Dr. Durward Fortes  . UPPER GASTROINTESTINAL ENDOSCOPY     normal    No Known Allergies  Outpatient Encounter Medications as of 11/14/2018  Medication Sig  . aspirin 81 MG tablet Take 81 mg by mouth daily. Take 1 tablet once a day.  . Calcium Carbonate-Vitamin D (CALCIUM-VITAMIN D) 500-200 MG-UNIT per tablet Take 1 tablet by mouth 2 (two) times daily. Take 1 tablet twice a day to help bones.  . Carboxymethylcell-Hypromellose (GENTEAL) 0.25-0.3 % GEL  Apply 1 application to eye daily. Use 1 application to each eye as needed to alleviate irritation.  . Cholecalciferol (VITAMIN D-3 PO) Take 1 tablet by mouth daily.   . irbesartan-hydrochlorothiazide (AVALIDE) 150-12.5 MG tablet Take 1 tablet by mouth daily.  . vitamin B-12 (CYANOCOBALAMIN) 1000 MCG tablet Take 1,000 mcg by mouth daily. Take 1 tablet once a day.  . vitamin C (ASCORBIC ACID) 500 MG tablet Take 500 mg by mouth daily. Take 1 tablet once a day.  . vitamin E 400 UNIT capsule Take 400 Units by mouth daily. Take 1 capsule once a day.  . [DISCONTINUED] hydrocortisone (ANUSOL-HC) 25 MG suppository INSERT 1 SUPPOSITORY PER RECTUM ONCE DAILY FOR HEMORRHOIDS.  . [DISCONTINUED] ibuprofen (IBU) 800 MG tablet Take 1 tablet (800 mg total) by mouth daily as needed (if pain severe only). (Patient not taking: Reported on 07/12/2018)   No facility-administered encounter medications on file as of 11/14/2018.     Review of Systems:  Review of Systems  Constitutional: Negative for chills, fever and malaise/fatigue.  HENT: Negative for hearing loss.   Eyes: Negative for blurred vision.  Respiratory: Negative for cough and shortness of breath.   Cardiovascular: Negative for chest pain, palpitations and leg swelling.  Gastrointestinal: Negative for abdominal pain, blood in stool, constipation, diarrhea and melena.  Genitourinary: Negative for dysuria.  Musculoskeletal: Positive for joint pain. Negative for back pain and falls.  Skin: Negative for itching and rash.  Neurological: Negative for dizziness and loss of consciousness.  Psychiatric/Behavioral: Negative for depression and memory loss. The patient does not have insomnia.        Caregiver stress    Health Maintenance  Topic Date Due  . TETANUS/TDAP  04/30/2008  . OPHTHALMOLOGY EXAM  11/16/2017  . FOOT EXAM  09/17/2018  . HEMOGLOBIN A1C  04/14/2019  . DEXA SCAN  Completed  . PNA vac Low Risk Adult  Completed    Physical  Exam: Vitals:   11/14/18 1120  BP: 140/80  Pulse: 68  Temp: 98 F (36.7 C)  TempSrc: Oral  SpO2: 97%  Weight: 239 lb (108.4 kg)  Height: 5\' 3"  (1.6 m)   Body mass index is 42.34 kg/m. Physical Exam Vitals signs reviewed.  Constitutional:      General: She is not in acute distress.    Appearance: Normal appearance. She is obese. She is not toxic-appearing.  HENT:     Head: Normocephalic and atraumatic.  Cardiovascular:     Rate and Rhythm: Normal rate and regular rhythm.     Pulses:  Normal pulses.     Heart sounds: Normal heart sounds.  Pulmonary:     Effort: Pulmonary effort is normal.     Breath sounds: Normal breath sounds.  Abdominal:     General: Bowel sounds are normal.  Musculoskeletal: Normal range of motion.  Skin:    General: Skin is warm and dry.  Neurological:     General: No focal deficit present.     Mental Status: She is oriented to person, place, and time.  Psychiatric:        Mood and Affect: Mood normal.     Labs reviewed: Basic Metabolic Panel: Recent Labs    01/17/18 0910 06/13/18 0824 10/14/18 0834  NA 142 144 143  K 4.4 3.9 4.0  CL 107 109 107  CO2 28 27 27   GLUCOSE 100* 100* 107*  BUN 16 19 13   CREATININE 1.02* 1.10* 0.99*  CALCIUM 9.5 9.4 9.1   Liver Function Tests: Recent Labs    01/17/18 0910 06/13/18 0824  AST 31 22  ALT 15 13  BILITOT 0.7 0.4  PROT 6.2 6.0*   No results for input(s): LIPASE, AMYLASE in the last 8760 hours. No results for input(s): AMMONIA in the last 8760 hours. CBC: Recent Labs    01/17/18 0910 10/14/18 0834  WBC 6.7 7.3  NEUTROABS 3,491 4,088  HGB 12.2 12.7  HCT 35.8 37.1  MCV 89.9 91.4  PLT 218 210   Lipid Panel: Recent Labs    01/17/18 0910 06/13/18 0824 10/14/18 0834  CHOL 193 182 188  HDL 54 53 56  LDLCALC 125* 115* 115*  TRIG 57 59 78  CHOLHDL 3.6 3.4 3.4   Lab Results  Component Value Date   HGBA1C 6.2 (H) 10/14/2018   Assessment/Plan 1. Type 2 diabetes mellitus with  stage 1 chronic kidney disease, without long-term current use of insulin (HCC) - cont to work on diet and walk more - Lipid panel; Future - Hemoglobin A1c; Future -hba1c steady in prediabetic range right now  2. Body mass index (BMI) of 40.1-44.9 in adult [facility]) -is aware she needs to lose weight, has trended down a little bit in recent past and we figured out some of the culprits today (cheese toast) being one  3. Localized osteoarthritis of left knee -cont to walk as able, may use supportive brace if needed, weight loss would help  4. Hyperlipidemia, unspecified hyperlipidemia type - she wants to work on her diet more before next visit--handout given on low cholesterol diet - Lipid panel; Future  5. Essential hypertension -bp at goal with current therapy, cont same regimen and monitor, walk more, improve diet  6. Need for Tdap vaccination - Tdap (ADACEL) 01-31-14.5 LF-MCG/0.5 injection; Inject 0.5 mLs into the muscle once for 1 dose.  Dispense: 0.5 mL; Refill: 0  Labs/tests ordered:  @ORDERS @ Next appt:  Visit date not found   Kody Vigil L. Aryanna Shaver, D.O. [facility] [address], [place] [address] Cell Phone (Mon-Fri 8am-5pm):  [phone] On Call:  [phone] & follow prompts after 5pm & weekends Office Phone:  [phone] Office Fax:  [phone]

## 2018-11-14 NOTE — Patient Instructions (Signed)
Fat and Cholesterol Restricted Eating Plan  Eating a diet that limits fat and cholesterol may help lower your risk for heart disease and other conditions. Your body needs fat and cholesterol for basic functions, but eating too much of these things can be harmful to your health.  Your health care provider may order lab tests to check your blood fat (lipid) and cholesterol levels. This helps your health care provider understand your risk for certain conditions and whether you need to make diet changes. Work with your health care provider or dietitian to make an eating plan that is right for you.    What are tips for following this plan?  General guidelines    · If you are overweight, work with your health care provider to lose weight safely. Losing just 5-10% of your body weight can improve your overall health and help prevent diseases such as diabetes and heart disease.  · Avoid:  ? Foods with added sugar.  ? Fried foods.  ? Foods that contain partially hydrogenated oils, including stick margarine, some tub margarines, cookies, crackers, and other baked goods.  · Limit alcohol intake to no more than 1 drink a day for nonpregnant women and 2 drinks a day for men. One drink equals 12 oz of beer, 5 oz of wine, or 1½ oz of hard liquor.  Reading food labels  · Check food labels for:  ? Trans fats, partially hydrogenated oils, or high amounts of saturated fat. Avoid foods that contain saturated fat and trans fat.  ? The amount of cholesterol in each serving. Try to eat no more than 200 mg of cholesterol each day.  ? The amount of fiber in each serving. Try to eat at least 20-30 g of fiber each day.  · Choose foods with healthy fats, such as:  ? Monounsaturated and polyunsaturated fats. These include olive and canola oil, flaxseeds, walnuts, almonds, and seeds.  ? Omega-3 fats. These are found in foods such as salmon, mackerel, sardines, tuna, flaxseed oil, and ground flaxseeds.  · Choose grain products that have whole  grains. Look for the word "whole" as the first word in the ingredient list.  Cooking  · Cook foods using methods other than frying. Baking, boiling, grilling, and broiling are some healthy options.  · Eat more home-cooked food and less restaurant, buffet, and fast food.  · Avoid cooking using saturated fats.  ? Animal sources of saturated fats include meats, butter, and cream.  ? Plant sources of saturated fats include palm oil, palm kernel oil, and coconut oil.  Meal planning    · At meals, imagine dividing your plate into fourths:  ? Fill one-half of your plate with vegetables and green salads.  ? Fill one-fourth of your plate with whole grains.  ? Fill one-fourth of your plate with lean protein foods.  · Eat fish that is high in omega-3 fats at least two times a week.  · Eat more foods that contain fiber, such as whole grains, beans, apples, broccoli, carrots, peas, and barley. These foods help promote healthy cholesterol levels in the blood.  Recommended foods  Grains  · Whole grains, such as whole wheat or whole grain breads, crackers, cereals, and pasta. Unsweetened oatmeal, bulgur, barley, quinoa, or brown rice. Corn or whole wheat flour tortillas.  Vegetables  · Fresh or frozen vegetables (raw, steamed, roasted, or grilled). Green salads.  Fruits  · All fresh, canned (in natural juice), or frozen fruits.  Meats and other   protein foods  · Ground beef (85% or leaner), grass-fed beef, or beef trimmed of fat. Skinless chicken or turkey. Ground chicken or turkey. Pork trimmed of fat. All fish and seafood. Egg whites. Dried beans, peas, or lentils. Unsalted nuts or seeds. Unsalted canned beans. Natural nut butters without added sugar and oil.  Dairy  · Low-fat or nonfat dairy products, such as skim or 1% milk, 2% or reduced-fat cheeses, low-fat and fat-free ricotta or cottage cheese, or plain low-fat and nonfat yogurt.  Fats and oils  · Tub margarine without trans fats. Light or reduced-fat mayonnaise and salad  dressings. Avocado. Olive, canola, sesame, or safflower oils.  The items listed above may not be a complete list of recommended foods or beverages. Contact your dietitian for more options.  Foods to avoid  Grains  · White bread. White pasta. White rice. Cornbread. Bagels, pastries, and croissants. Crackers and snack foods that contain trans fat and hydrogenated oils.  Vegetables  · Vegetables cooked in cheese, cream, or butter sauce. Fried vegetables.  Fruits  · Canned fruit in heavy syrup. Fruit in cream or butter sauce. Fried fruit.  Meats and other protein foods  · Fatty cuts of meat. Ribs, chicken wings, bacon, sausage, bologna, salami, chitterlings, fatback, hot dogs, bratwurst, and packaged lunch meats. Liver and organ meats. Whole eggs and egg yolks. Chicken and turkey with skin. Fried meat.  Dairy  · Whole or 2% milk, cream, half-and-half, and cream cheese. Whole milk cheeses. Whole-fat or sweetened yogurt. Full-fat cheeses. Nondairy creamers and whipped toppings. Processed cheese, cheese spreads, and cheese curds.  Beverages  · Alcohol. Sugar-sweetened drinks such as sodas, lemonade, and fruit drinks.  Fats and oils  · Butter, stick margarine, lard, shortening, ghee, or bacon fat. Coconut, palm kernel, and palm oils.  Sweets and desserts  · Corn syrup, sugars, honey, and molasses. Candy. Jam and jelly. Syrup. Sweetened cereals. Cookies, pies, cakes, donuts, muffins, and ice cream.  The items listed above may not be a complete list of foods and beverages to avoid. Contact your dietitian for more information.  Summary  · Your body needs fat and cholesterol for basic functions. However, eating too much of these things can be harmful to your health.  · Work with your health care provider and dietitian to follow a diet low in fat and cholesterol. Doing this may help lower your risk for heart disease and other conditions.  · Choose healthy fats, such as monounsaturated and polyunsaturated fats, and foods high in  omega-3 fatty acids.  · Eat fiber-rich foods, such as whole grains, beans, peas, fruits, and vegetables.  · Limit or avoid alcohol, fried foods, and foods high in saturated fats, partially hydrogenated oils, and sugar.  This information is not intended to replace advice given to you by your health care provider. Make sure you discuss any questions you have with your health care provider.  Document Released: 09/18/2005 Document Revised: 06/05/2017 Document Reviewed: 06/05/2017  Elsevier Interactive Patient Education © 2019 Elsevier Inc.

## 2019-03-06 ENCOUNTER — Other Ambulatory Visit: Payer: Medicare Other

## 2019-03-17 ENCOUNTER — Ambulatory Visit: Payer: Medicare Other | Admitting: Internal Medicine

## 2019-04-03 LAB — HM MAMMOGRAPHY

## 2019-04-10 ENCOUNTER — Other Ambulatory Visit: Payer: Medicare Other

## 2019-04-10 ENCOUNTER — Other Ambulatory Visit: Payer: Self-pay

## 2019-04-10 DIAGNOSIS — E1122 Type 2 diabetes mellitus with diabetic chronic kidney disease: Secondary | ICD-10-CM

## 2019-04-10 DIAGNOSIS — E785 Hyperlipidemia, unspecified: Secondary | ICD-10-CM

## 2019-04-11 LAB — HEMOGLOBIN A1C
Hgb A1c MFr Bld: 6.1 % of total Hgb — ABNORMAL HIGH (ref <5.7)
Mean Plasma Glucose: 128 (calc)
eAG (mmol/L): 7.1 (calc)

## 2019-04-11 LAB — LIPID PANEL
Cholesterol: 210 mg/dL — ABNORMAL HIGH (ref <200)
HDL: 61 mg/dL (ref >=50)
LDL Cholesterol (Calc): 132 mg/dL (calc) — ABNORMAL HIGH
Non-HDL Cholesterol (Calc): 149 mg/dL (calc) — ABNORMAL HIGH (ref <130)
Total CHOL/HDL Ratio: 3.4 (calc) (ref <5.0)
Triglycerides: 74 mg/dL (ref <150)

## 2019-04-14 ENCOUNTER — Telehealth: Payer: Self-pay | Admitting: Internal Medicine

## 2019-04-14 NOTE — Telephone Encounter (Signed)
Danielle Pierce called and stated that she needed to speak with Dr. Mariea Clonts regarding her appointment on 04/17/19.  Also states she has several other things that she needs to discuss but would not give any further information.

## 2019-04-14 NOTE — Telephone Encounter (Signed)
Spoke with wife and she has concerns about pt's sores, will document on pt's chart.

## 2019-04-17 ENCOUNTER — Other Ambulatory Visit: Payer: Self-pay

## 2019-04-17 ENCOUNTER — Encounter: Payer: Self-pay | Admitting: Internal Medicine

## 2019-04-17 ENCOUNTER — Ambulatory Visit (INDEPENDENT_AMBULATORY_CARE_PROVIDER_SITE_OTHER): Payer: Medicare Other | Admitting: Internal Medicine

## 2019-04-17 VITALS — BP 140/88 | HR 72 | Temp 97.7°F | Ht 63.0 in | Wt 241.0 lb

## 2019-04-17 DIAGNOSIS — Z636 Dependent relative needing care at home: Secondary | ICD-10-CM

## 2019-04-17 DIAGNOSIS — N181 Chronic kidney disease, stage 1: Secondary | ICD-10-CM

## 2019-04-17 DIAGNOSIS — I10 Essential (primary) hypertension: Secondary | ICD-10-CM | POA: Diagnosis not present

## 2019-04-17 DIAGNOSIS — E1122 Type 2 diabetes mellitus with diabetic chronic kidney disease: Secondary | ICD-10-CM

## 2019-04-17 DIAGNOSIS — E785 Hyperlipidemia, unspecified: Secondary | ICD-10-CM

## 2019-04-17 NOTE — Patient Instructions (Signed)
Try to get back on track with some walking.  We discussed the MOST form today--I recommend that you and your daughter discuss this and then it can be completed at the care plan meeting.  I recommend comfort care for Danielle Pierce and avoiding tube feeding and hospitalizations.

## 2019-04-17 NOTE — Progress Notes (Signed)
Location:  [facility] clinic Provider:  Kryssa Risenhoover L. Mariea Clonts, D.O., C.M.D.  Goals of Care:  Advanced Directives 06/13/2018  Does Patient Have a Medical Advance Directive? Yes  Type of Paramedic of [place];Living will  Does patient want to make changes to medical advance directive? No - Patient declined  Copy of [place] in Chart? No - copy requested  Would patient like information on creating a medical advance directive? -   Chief Complaint  Patient presents with  . Medical Management of Chronic Issues    HPI: Patient is a 78 y.o. female seen today for medical management of chronic diseases.    She has not been walking with [place]a Moos being in the nursing home.  LDL is up to 132.  HDL 61.  She'd been walking at the center which is closed.  [facility].    Sugar average came down slightly to 6.1 from 6.2.    She is on furlough b/c the [employer] is only open 12-7.   She is stressed about her husband's advanced illness.  She tells me about how some money is missing that he may have "invested" somewhere.  She understands he's very sick and his prognosis is poor. I suggested she and her daughter work with his doctor at [facility] to complete a MOST form and encouraged comfort care and avoiding tube feedings and hospitalizations.  She had already discussed DNR with them there for him.  Past Medical History:  Diagnosis Date  . Arthritis    Ankle  . Asymptomatic varicose veins   . Benign neoplasm of colon   . Bilateral shoulder pain 07/02/14  . Cataract    Bilateral - just watching  . Cervicitis and endocervicitis 09/13/2011  . Disorder of bone and cartilage, unspecified   . Disorders of bursae and tendons in shoulder region, unspecified   . External hemorrhoids without mention of complication   . Fibrocystic breast   . Generalized osteoarthrosis, unspecified site   . GERD (gastroesophageal  reflux disease)    diet controlled, No meds  . Hiatal hernia   . Hypertension   . Impacted cerumen 11/07/2011  . Obesity, unspecified   . Other diseases of nasal cavity and sinuses(478.19)   . Pain in joint, ankle and foot   . Pain in joint, pelvic region and thigh   . Pain in joint, shoulder region   . Reflux esophagitis   . SVD (spontaneous vaginal delivery)    x 2  . Unspecified essential hypertension   . Unspecified vitamin D deficiency     Past Surgical History:  Procedure Laterality Date  . COLONOSCOPY  2006   Dr.Orr  . COLONOSCOPY  07/08/2013   Dr. Carlean Purl  . [place]   Hemorrhoids   . MOUTH SURGERY  2005   DR LUTINS - tooth ext and gum surgery  . SHOULDER ARTHROSCOPY W/ ACROMIAL REPAIR Right 07/02/14   Dr. Durward Fortes  . UPPER GASTROINTESTINAL ENDOSCOPY     normal    No Known Allergies  Outpatient Encounter Medications as of 04/17/2019  Medication Sig  . aspirin 81 MG tablet Take 81 mg by mouth daily. Take 1 tablet once a day.  . Calcium Carbonate-Vitamin D (CALCIUM-VITAMIN D) 500-200 MG-UNIT per tablet Take 1 tablet by mouth 2 (two) times daily. Take 1 tablet twice a day to help bones.  . Carboxymethylcell-Hypromellose (GENTEAL) 0.25-0.3 % GEL  Apply 1 application to eye daily. Use 1 application to each eye as needed to alleviate irritation.  . Cholecalciferol (VITAMIN D-3 PO) Take 1 tablet by mouth daily.   . irbesartan-hydrochlorothiazide (AVALIDE) 150-12.5 MG tablet Take 1 tablet by mouth daily.  . vitamin B-12 (CYANOCOBALAMIN) 1000 MCG tablet Take 1,000 mcg by mouth daily. Take 1 tablet once a day.  . vitamin C (ASCORBIC ACID) 500 MG tablet Take 500 mg by mouth daily. Take 1 tablet once a day.  . vitamin E 400 UNIT capsule Take 400 Units by mouth daily. Take 1 capsule once a day.   No facility-administered encounter medications on file as of 04/17/2019.     Review of Systems:  Review of Systems  Constitutional: Positive for malaise/fatigue.  Negative for chills and fever.  HENT: Negative for congestion and hearing loss.   Eyes: Negative for blurred vision.  Respiratory: Negative for shortness of breath.   Cardiovascular: Negative for chest pain, palpitations and leg swelling.  Genitourinary: Negative for dysuria.  Musculoskeletal: Positive for joint pain. Negative for falls.  Skin: Negative for itching and rash.  Neurological: Negative for dizziness and loss of consciousness.  Endo/Heme/Allergies: Does not bruise/bleed easily.  Psychiatric/Behavioral: Negative for depression and memory loss. The patient is not nervous/anxious and does not have insomnia.        Stress    Health Maintenance  Topic Date Due  . TETANUS/TDAP  04/30/2008  . OPHTHALMOLOGY EXAM  11/16/2017  . HEMOGLOBIN A1C  10/11/2019  . FOOT EXAM  11/15/2019  . DEXA SCAN  Completed  . PNA vac Low Risk Adult  Completed    Physical Exam: Vitals:   04/17/19 1510  BP: 140/88  Pulse: 72  Temp: 97.7 F (36.5 C)  TempSrc: Oral  Weight: 241 lb (109.3 kg)  Height: 5\' 3"  (1.6 m)   Body mass index is 42.69 kg/m. Physical Exam Vitals signs reviewed.  Constitutional:      General: She is not in acute distress.    Appearance: She is obese. She is not toxic-appearing.  HENT:     Head: Normocephalic and atraumatic.  Cardiovascular:     Rate and Rhythm: Normal rate and regular rhythm.     Pulses: Normal pulses.     Heart sounds: Normal heart sounds.  Pulmonary:     Effort: Pulmonary effort is normal.     Breath sounds: Normal breath sounds. No wheezing, rhonchi or rales.  Musculoskeletal: Normal range of motion.  Skin:    General: Skin is warm and dry.  Neurological:     General: No focal deficit present.     Mental Status: She is alert and oriented to person, place, and time.  Psychiatric:        Mood and Affect: Mood normal.        Behavior: Behavior normal.        Thought Content: Thought content normal.        Judgment: Judgment normal.      Labs reviewed: Basic Metabolic Panel: Recent Labs    06/13/18 0824 10/14/18 0834  NA 144 143  K 3.9 4.0  CL 109 107  CO2 27 27  GLUCOSE 100* 107*  BUN 19 13  CREATININE 1.10* 0.99*  CALCIUM 9.4 9.1   Liver Function Tests: Recent Labs    06/13/18 0824  AST 22  ALT 13  BILITOT 0.4  PROT 6.0*   No results for input(s): LIPASE, AMYLASE in the last 8760 hours. No results for input(s):  AMMONIA in the last 8760 hours. CBC: Recent Labs    10/14/18 0834  WBC 7.3  NEUTROABS 4,088  HGB 12.7  HCT 37.1  MCV 91.4  PLT 210   Lipid Panel: Recent Labs    06/13/18 0824 10/14/18 0834 04/10/19 0827  CHOL 182 188 210*  HDL 53 56 61  LDLCALC 115* 115* 132*  TRIG 59 78 74  CHOLHDL 3.4 3.4 3.4   Lab Results  Component Value Date   HGBA1C 6.1 (H) 04/10/2019    Procedures since last visit: No results found.  Assessment/Plan 1. Type 2 diabetes mellitus with stage 1 chronic kidney disease, without long-term current use of insulin (HCC) - hba1c trended down slightly -encouraged her to resume walking and she plans to look into an alternative indoor option - Hemoglobin A1c; Future - Lipid panel; Future - Basic metabolic panel; Future  2. Essential hypertension - bp at upper limit of normal today--she's highly stressed and has not been exercising--will get back on track - Basic metabolic panel; Future  3. Hyperlipidemia, unspecified hyperlipidemia type - refuses meds--discussed she must walk or take pills to get this down--wants to walk - Lipid panel; Future  4. Caregiver stress -counseled some about advance care planning for her husband--documentation in his chart  Labs/tests ordered:   Lab Orders     Hemoglobin A1c     Lipid panel     Basic metabolic panel  Next appt:  06/20/2019   Aeron Lheureux L. Izayiah Tibbitts, D.O. [facility] [address], [place] [address] Cell Phone (Mon-Fri 8am-5pm):  [phone] On Call:   [phone] & follow prompts after 5pm & weekends Office Phone:  [phone] Office Fax:  [phone]

## 2019-05-28 ENCOUNTER — Other Ambulatory Visit: Payer: Self-pay | Admitting: Internal Medicine

## 2019-05-28 NOTE — Telephone Encounter (Signed)
Spoke with patient, patient states she uses this off and on for hemorrhoids. Patient had an episode on Monday with bleeding from hemorrhoids that has now resolved yet she needs rx. Patient aware that I will send to Dr.Reed for approval for this is not on her current medication list.

## 2019-06-20 ENCOUNTER — Ambulatory Visit: Payer: Self-pay

## 2019-06-20 ENCOUNTER — Encounter: Payer: Medicare Other | Admitting: Family

## 2019-06-24 ENCOUNTER — Ambulatory Visit (INDEPENDENT_AMBULATORY_CARE_PROVIDER_SITE_OTHER): Payer: Medicare Other | Admitting: Family

## 2019-06-24 ENCOUNTER — Other Ambulatory Visit: Payer: Self-pay

## 2019-06-24 ENCOUNTER — Encounter: Payer: Self-pay | Admitting: Family

## 2019-06-24 VITALS — BP 130/80 | HR 67 | Temp 97.3°F | Ht 63.0 in | Wt 246.0 lb

## 2019-06-24 DIAGNOSIS — Z0000 Encounter for general adult medical examination without abnormal findings: Secondary | ICD-10-CM | POA: Diagnosis not present

## 2019-06-24 DIAGNOSIS — Z23 Encounter for immunization: Secondary | ICD-10-CM | POA: Diagnosis not present

## 2019-06-24 MED ORDER — TETANUS-DIPHTH-ACELL PERTUSSIS 5-2-15.5 LF-MCG/0.5 IM SUSP: 0.5000 mL | Freq: Once | INTRAMUSCULAR | 0 refills | Status: AC

## 2019-06-24 MED ORDER — SHINGRIX 50 MCG/0.5ML IM SUSR: 0.5000 mL | Freq: Once | INTRAMUSCULAR | 0 refills | Status: AC

## 2019-06-24 NOTE — Patient Instructions (Signed)
Danielle Pierce , Thank you for taking time to come for your Medicare Wellness Visit. I appreciate your ongoing commitment to your health goals. Please review the following plan we discussed and let me know if I can assist you in the future.   Screening recommendations/referrals: Colonoscopy: Up to date  Mammogram: Up to date  Bone Density:  Up to date  Recommended yearly ophthalmology/optometry visit for glaucoma screening and checkup Recommended yearly dental visit for hygiene and checkup  Vaccinations: Influenza vaccine: Administered this visit  Pneumococcal vaccine: Up to date  Tdap vaccine: Ordered today  Shingles vaccine: Ordered today   Advanced directives: No   Conditions/risks identified: Advance age female > 75 yrs,Obesity,Hypertension,Hyperlipidemia,Prediabetes   Next appointment: 1 Yr.   Preventive Care 66 Years and Older, Female Preventive care refers to lifestyle choices and visits with your health care provider that can promote health and wellness. What does preventive care include?  A yearly physical exam. This is also called an annual well check.  Dental exams once or twice a year.  Routine eye exams. Ask your health care provider how often you should have your eyes checked.  Personal lifestyle choices, including:  Daily care of your teeth and gums.  Regular physical activity.  Eating a healthy diet.  Avoiding tobacco and drug use.  Limiting alcohol use.  Practicing safe sex.  Taking low-dose aspirin every day.  Taking vitamin and mineral supplements as recommended by your health care provider. What happens during an annual well check? The services and screenings done by your health care provider during your annual well check will depend on your age, overall health, lifestyle risk factors, and family history of disease. Counseling  Your health care provider may ask you questions about your:  Alcohol use.  Tobacco use.  Drug use.  Emotional  well-being.  Home and relationship well-being.  Sexual activity.  Eating habits.  History of falls.  Memory and ability to understand (cognition).  Work and work Statistician.  Reproductive health. Screening  You may have the following tests or measurements:  Height, weight, and BMI.  Blood pressure.  Lipid and cholesterol levels. These may be checked every 5 years, or more frequently if you are over 35 years old.  Skin check.  Lung cancer screening. You may have this screening every year starting at age 51 if you have a 30-pack-year history of smoking and currently smoke or have quit within the past 15 years.  Fecal occult blood test (FOBT) of the stool. You may have this test every year starting at age 75.  Flexible sigmoidoscopy or colonoscopy. You may have a sigmoidoscopy every 5 years or a colonoscopy every 10 years starting at age 13.  Hepatitis C blood test.  Hepatitis B blood test.  Sexually transmitted disease (STD) testing.  Diabetes screening. This is done by checking your blood sugar (glucose) after you have not eaten for a while (fasting). You may have this done every 1-3 years.  Bone density scan. This is done to screen for osteoporosis. You may have this done starting at age 72.  Mammogram. This may be done every 1-2 years. Talk to your health care provider about how often you should have regular mammograms. Talk with your health care provider about your test results, treatment options, and if necessary, the need for more tests. Vaccines  Your health care provider may recommend certain vaccines, such as:  Influenza vaccine. This is recommended every year.  Tetanus, diphtheria, and acellular pertussis (Tdap, Td) vaccine.  You may need a Td booster every 10 years.  Zoster vaccine. You may need this after age 22.  Pneumococcal 13-valent conjugate (PCV13) vaccine. One dose is recommended after age 76.  Pneumococcal polysaccharide (PPSV23) vaccine. One  dose is recommended after age 69. Talk to your health care provider about which screenings and vaccines you need and how often you need them. This information is not intended to replace advice given to you by your health care provider. Make sure you discuss any questions you have with your health care provider. Document Released: 10/15/2015 Document Revised: 06/07/2016 Document Reviewed: 07/20/2015 Elsevier Interactive Patient Education  2017 [place] Prevention in the Home Falls can cause injuries. They can happen to people of all ages. There are many things you can do to make your home safe and to help prevent falls. What can I do on the outside of my home?  Regularly fix the edges of walkways and driveways and fix any cracks.  Remove anything that might make you trip as you walk through a door, such as a raised step or threshold.  Trim any bushes or trees on the path to your home.  Use bright outdoor lighting.  Clear any walking paths of anything that might make someone trip, such as rocks or tools.  Regularly check to see if handrails are loose or broken. Make sure that both sides of any steps have handrails.  Any raised decks and porches should have guardrails on the edges.  Have any leaves, snow, or ice cleared regularly.  Use sand or salt on walking paths during winter.  Clean up any spills in your garage right away. This includes oil or grease spills. What can I do in the bathroom?  Use night lights.  Install grab bars by the toilet and in the tub and shower. Do not use towel bars as grab bars.  Use non-skid mats or decals in the tub or shower.  If you need to sit down in the shower, use a plastic, non-slip stool.  Keep the floor dry. Clean up any water that spills on the floor as soon as it happens.  Remove soap buildup in the tub or shower regularly.  Attach bath mats securely with double-sided non-slip rug tape.  Do not have throw rugs and other  things on the floor that can make you trip. What can I do in the bedroom?  Use night lights.  Make sure that you have a light by your bed that is easy to reach.  Do not use any sheets or blankets that are too big for your bed. They should not hang down onto the floor.  Have a firm chair that has side arms. You can use this for support while you get dressed.  Do not have throw rugs and other things on the floor that can make you trip. What can I do in the kitchen?  Clean up any spills right away.  Avoid walking on wet floors.  Keep items that you use a lot in easy-to-reach places.  If you need to reach something above you, use a strong step stool that has a grab bar.  Keep electrical cords out of the way.  Do not use floor polish or wax that makes floors slippery. If you must use wax, use non-skid floor wax.  Do not have throw rugs and other things on the floor that can make you trip. What can I do with my stairs?  Do not leave any  items on the stairs.  Make sure that there are handrails on both sides of the stairs and use them. Fix handrails that are broken or loose. Make sure that handrails are as long as the stairways.  Check any carpeting to make sure that it is firmly attached to the stairs. Fix any carpet that is loose or worn.  Avoid having throw rugs at the top or bottom of the stairs. If you do have throw rugs, attach them to the floor with carpet tape.  Make sure that you have a light switch at the top of the stairs and the bottom of the stairs. If you do not have them, ask someone to add them for you. What else can I do to help prevent falls?  Wear shoes that:  Do not have high heels.  Have rubber bottoms.  Are comfortable and fit you well.  Are closed at the toe. Do not wear sandals.  If you use a stepladder:  Make sure that it is fully opened. Do not climb a closed stepladder.  Make sure that both sides of the stepladder are locked into place.  Ask  someone to hold it for you, if possible.  Clearly mark and make sure that you can see:  Any grab bars or handrails.  First and last steps.  Where the edge of each step is.  Use tools that help you move around (mobility aids) if they are needed. These include:  Canes.  Walkers.  Scooters.  Crutches.  Turn on the lights when you go into a dark area. Replace any light bulbs as soon as they burn out.  Set up your furniture so you have a clear path. Avoid moving your furniture around.  If any of your floors are uneven, fix them.  If there are any pets around you, be aware of where they are.  Review your medicines with your doctor. Some medicines can make you feel dizzy. This can increase your chance of falling. Ask your doctor what other things that you can do to help prevent falls. This information is not intended to replace advice given to you by your health care provider. Make sure you discuss any questions you have with your health care provider. Document Released: 07/15/2009 Document Revised: 02/24/2016 Document Reviewed: 10/23/2014 Elsevier Interactive Patient Education  2017 [employer].

## 2019-06-24 NOTE — Progress Notes (Signed)
Subjective:   Danielle Pierce is a 78 y.o. female who presents for Medicare Annual (Subsequent) preventive examination.  Review of Systems:  Cardiac Risk Factors include: advanced age (>15men, >78 women);obesity (BMI >30kg/m2);hypertension;dyslipidemia     Objective:     Vitals: BP 130/80   Pulse 67   Temp (!) 97.3 F (36.3 C) (Temporal)   Ht 5\' 3"  (1.6 m)   Wt 246 lb (111.6 kg)   SpO2 98%   BMI 43.58 kg/m   Body mass index is 43.58 kg/m.  Advanced Directives 06/24/2019 06/13/2018 01/21/2018 05/10/2017 04/16/2017 11/08/2016 05/17/2016  Does Patient Have a Medical Advance Directive? Yes Yes No No No No No  Type of Paramedic of [place];Living will [place];Living will - - - - -  Does patient want to make changes to medical advance directive? No - Patient declined No - Patient declined - - - - -  Copy of [place] in Chart? No - copy requested No - copy requested - - - - -  Would patient like information on creating a medical advance directive? - - No - Patient declined No - Patient declined - - Yes - Scientist, clinical (histocompatibility and immunogenetics) given    Tobacco Social History   Tobacco Use  Smoking Status Never Smoker  Smokeless Tobacco Never Used     Counseling given: Not Answered   Clinical Intake:  Pre-visit preparation completed: No  Pain : No/denies pain     BMI - recorded: 43.58 Nutritional Status: BMI > 30  Obese Nutritional Risks: None Diabetes: No  How often do you need to have someone help you when you read instructions, pamphlets, or other written materials from your doctor or pharmacy?: 1 - Never What is the last grade level you completed in school?: Masters [employer] and counselling  Interpreter Needed?: No  Information entered by :: Kirsty Monjaraz FNP-C  Past Medical History:  Diagnosis Date  . Arthritis    Ankle  . Asymptomatic varicose veins   . Benign neoplasm of colon   . Bilateral shoulder pain  07/02/14  . Cataract    Bilateral - just watching  . Cervicitis and endocervicitis 09/13/2011  . Disorder of bone and cartilage, unspecified   . Disorders of bursae and tendons in shoulder region, unspecified   . External hemorrhoids without mention of complication   . Fibrocystic breast   . Generalized osteoarthrosis, unspecified site   . GERD (gastroesophageal reflux disease)    diet controlled, No meds  . Hiatal hernia   . Hypertension   . Impacted cerumen 11/07/2011  . Obesity, unspecified   . Other diseases of nasal cavity and sinuses(478.19)   . Pain in joint, ankle and foot   . Pain in joint, pelvic region and thigh   . Pain in joint, shoulder region   . Reflux esophagitis   . SVD (spontaneous vaginal delivery)    x 2  . Unspecified essential hypertension   . Unspecified vitamin D deficiency    Past Surgical History:  Procedure Laterality Date  . COLONOSCOPY  2006   Dr.Orr  . COLONOSCOPY  07/08/2013   Dr. Carlean Purl  . [place]   Hemorrhoids   . MOUTH SURGERY  2005   DR LUTINS - tooth ext and gum surgery  . SHOULDER ARTHROSCOPY W/ ACROMIAL REPAIR Right 07/02/14   Dr. Durward Fortes  . UPPER GASTROINTESTINAL ENDOSCOPY     normal   Family History  Problem Relation  Age of Onset  . Hypertension Mother   . Kidney disease Mother        Renal failure  . Cancer Brother        Lymphoma  . ADD / ADHD Son   . Seizures Son   . Hypertension Sister   . Dementia Sister   . Hypertension Sister   . Hypertension Sister   . Cancer - Other Sister   . Early death Brother        Some type of accident  . Colon cancer Father 78  . Stomach cancer Neg Hx   . Rectal cancer Neg Hx    Social History   Socioeconomic History  . Marital status: Married    Spouse name: Not on file  . Number of children: Not on file  . Years of education: Not on file  . Highest education level: Not on file  Occupational History  . Occupation: retired Landscape architect   Social Needs  . Financial resource strain: Not hard at all  . Food insecurity    Worry: Never true    Inability: Never true  . Transportation needs    Medical: No    Non-medical: No  Tobacco Use  . Smoking status: Never Smoker  . Smokeless tobacco: Never Used  Substance and Sexual Activity  . Alcohol use: Yes    Comment: occasional mixed drink   . Drug use: No  . Sexual activity: Not Currently    Birth control/protection: Post-menopausal  Lifestyle  . Physical activity    Days per week: 0 days    Minutes per session: 0 min  . Stress: To some extent  Relationships  . Social connections    Talks on phone: More than three times a week    Gets together: More than three times a week    Attends religious service: More than 4 times per year    Active member of club or organization: No    Attends meetings of clubs or organizations: Never    Relationship status: Married  Other Topics Concern  . Not on file  Social History Narrative   Married    Never smoked   Alcohlol none   Exercise treadmill 3 times a week     Outpatient Encounter Medications as of 06/24/2019  Medication Sig  . aspirin 81 MG tablet Take 81 mg by mouth daily. Take 1 tablet once a day.  . Calcium Carbonate-Vitamin D (CALCIUM-VITAMIN D) 500-200 MG-UNIT per tablet Take 1 tablet by mouth 2 (two) times daily. Take 1 tablet twice a day to help bones.  . Carboxymethylcell-Hypromellose (GENTEAL) 0.25-0.3 % GEL Apply 1 application to eye daily. Use 1 application to each eye as needed to alleviate irritation.  . Cholecalciferol (VITAMIN D-3 PO) Take 1 tablet by mouth daily.   . hydrocortisone (ANUSOL-HC) 25 MG suppository INSERT 1 SUPPOSITORY PER RECTUM ONCE DAILY FOR HEMORRHOIDS.  Marland Kitchen irbesartan-hydrochlorothiazide (AVALIDE) 150-12.5 MG tablet Take 1 tablet by mouth daily.  . Tdap (ADACEL) 01-31-14.5 LF-MCG/0.5 injection Inject 0.5 mLs into the muscle once for 1 dose.  . vitamin B-12 (CYANOCOBALAMIN) 1000 MCG tablet Take  1,000 mcg by mouth daily. Take 1 tablet once a day.  . vitamin C (ASCORBIC ACID) 500 MG tablet Take 500 mg by mouth daily. Take 1 tablet once a day.  . vitamin E 400 UNIT capsule Take 400 Units by mouth daily. Take 1 capsule once a day.  . [DISCONTINUED] Tdap (ADACEL) 01-31-14.5 LF-MCG/0.5 injection Inject 0.5 mLs  into the muscle once.   No facility-administered encounter medications on file as of 06/24/2019.     Activities of Daily Living In your present state of health, do you have any difficulty performing the following activities: 06/24/2019  Hearing? N  Vision? N  Difficulty concentrating or making decisions? N  Walking or climbing stairs? N  Dressing or bathing? N  Doing errands, shopping? N  Preparing Food and eating ? N  Using the Toilet? N  In the past six months, have you accidently leaked urine? N  Do you have problems with loss of bowel control? N  Managing your Medications? N  Managing your Finances? N  Housekeeping or managing your Housekeeping? N  Some recent data might be hidden    Patient Care Team: Gayland Curry, DO as PCP - General (Geriatric Medicine) Garald Balding, MD as Consulting Physician (Orthopedic Surgery) Rutherford Guys, MD as Consulting Physician (Ophthalmology) Druscilla Brownie, MD as Consulting Physician (Dermatology) Gatha Mayer, MD as Consulting Physician (Gastroenterology)    Assessment:   This is a routine wellness examination for Sharry.  Exercise Activities and Dietary recommendations Current Exercise Habits: Home exercise routine, Type of exercise: walking, Time (Minutes): 15, Frequency (Times/Week): 2, Weekly Exercise (Minutes/Week): 30, Intensity: Mild, Exercise limited by: None identified  Goals    . Weight (lb) < 200 lb (90.7 kg)     I would like to loss weight at least 50 lbs  Exercise and eat health.       Fall Risk Fall Risk  06/24/2019 04/17/2019 11/14/2018 06/17/2018 06/13/2018  Falls in the past year? 0 0 0 No No   Number falls in past yr: 0 0 0 - -  Injury with Fall? 0 0 0 - -  Risk for fall due to : - - Impaired balance/gait - -  Follow up - - Falls evaluation completed;Education provided;Falls prevention discussed - -   Is the patient's home free of loose throw rugs in walkways, pet beds, electrical cords, etc?   no      Grab bars in the bathroom? no      Handrails on the stairs?   yes      Adequate lighting?   yes  Depression Screen PHQ 2/9 Scores 06/24/2019 04/17/2019 11/14/2018 06/17/2018  PHQ - 2 Score 0 0 0 0     Cognitive Function MMSE - Mini Mental State Exam 06/24/2019 06/13/2018 11/08/2016  Orientation to time 5 5 5   Orientation to Place 5 5 5   Registration 3 3 3   Attention/ Calculation 5 5 5   Recall 0 2 2  Language- name 2 objects 2 2 2   Language- repeat 1 1 1   Language- follow 3 step command 3 3 3   Language- read & follow direction 1 1 1   Write a sentence 1 1 1   Copy design 1 0 1  Total score 27 28 29         Immunization History  Administered Date(s) Administered  . Fluad Quad(high Dose 65+) 06/24/2019  . Influenza, High Dose Seasonal PF 09/17/2017, 06/17/2018  . PPD Test 10/02/1997  . Pneumococcal Conjugate-13 05/10/2017  . Pneumococcal Polysaccharide-23 06/13/2018  . Td 04/30/1998    Qualifies for Shingles Vaccine? Ordered this visit   Screening Tests Health Maintenance  Topic Date Due  . TETANUS/TDAP  04/30/2008  . HEMOGLOBIN A1C  10/11/2019  . OPHTHALMOLOGY EXAM  11/12/2019  . FOOT EXAM  11/15/2019  . DEXA SCAN  Completed  . PNA vac Low Risk Adult  Completed    Cancer Screenings: Lung: Low Dose CT Chest recommended if Age 20-80 years, 30 pack-year currently smoking OR have quit w/in 15years. Patient does not qualify. Breast:  Up to date on Mammogram? Yes   Up to date of Bone Density/Dexa? Yes Colorectal: Up to date   Additional Screenings:  Hepatitis C Screening: Low Risk     Plan:   - Tdap vaccine  - Shingrix vaccine   I have personally reviewed  and noted the following in the patient's chart:   . Medical and social history . Use of alcohol, tobacco or illicit drugs  . Current medications and supplements . Functional ability and status . Nutritional status . Physical activity . Advanced directives . List of other physicians . Hospitalizations, surgeries, and ER visits in previous 12 months . Vitals . Screenings to include cognitive, depression, and falls . Referrals and appointments  In addition, I have reviewed and discussed with patient certain preventive protocols, quality metrics, and best practice recommendations. A written personalized care plan for preventive services as well as general preventive health recommendations were provided to patient.    Sandrea Hughs, NP  06/24/2019

## 2019-08-06 ENCOUNTER — Other Ambulatory Visit: Payer: Medicare Other

## 2019-08-06 ENCOUNTER — Other Ambulatory Visit: Payer: Self-pay

## 2019-08-06 DIAGNOSIS — I10 Essential (primary) hypertension: Secondary | ICD-10-CM

## 2019-08-06 DIAGNOSIS — E1122 Type 2 diabetes mellitus with diabetic chronic kidney disease: Secondary | ICD-10-CM

## 2019-08-06 DIAGNOSIS — E785 Hyperlipidemia, unspecified: Secondary | ICD-10-CM

## 2019-08-07 LAB — HEMOGLOBIN A1C
Hgb A1c MFr Bld: 6 % of total Hgb — ABNORMAL HIGH (ref <5.7)
Mean Plasma Glucose: 126 (calc)
eAG (mmol/L): 7 (calc)

## 2019-08-07 LAB — BASIC METABOLIC PANEL
BUN/Creatinine Ratio: 13 (calc) (ref 6–22)
BUN: 14 mg/dL (ref 7–25)
CO2: 28 mmol/L (ref 20–32)
Calcium: 9.1 mg/dL (ref 8.6–10.4)
Chloride: 108 mmol/L (ref 98–110)
Creat: 1.06 mg/dL — ABNORMAL HIGH (ref 0.60–0.93)
Glucose, Bld: 116 mg/dL — ABNORMAL HIGH (ref 65–99)
Potassium: 3.9 mmol/L (ref 3.5–5.3)
Sodium: 143 mmol/L (ref 135–146)

## 2019-08-07 LAB — LIPID PANEL
Cholesterol: 213 mg/dL — ABNORMAL HIGH (ref <200)
HDL: 58 mg/dL (ref >=50)
LDL Cholesterol (Calc): 140 mg/dL (calc) — ABNORMAL HIGH
Non-HDL Cholesterol (Calc): 155 mg/dL (calc) — ABNORMAL HIGH (ref <130)
Total CHOL/HDL Ratio: 3.7 (calc) (ref <5.0)
Triglycerides: 60 mg/dL (ref <150)

## 2019-08-11 ENCOUNTER — Other Ambulatory Visit: Payer: Self-pay

## 2019-08-11 ENCOUNTER — Ambulatory Visit (INDEPENDENT_AMBULATORY_CARE_PROVIDER_SITE_OTHER): Payer: Medicare Other | Admitting: Internal Medicine

## 2019-08-11 ENCOUNTER — Encounter: Payer: Self-pay | Admitting: Internal Medicine

## 2019-08-11 VITALS — BP 128/80 | HR 67 | Temp 97.7°F | Ht 63.0 in | Wt 243.8 lb

## 2019-08-11 DIAGNOSIS — K644 Residual hemorrhoidal skin tags: Secondary | ICD-10-CM

## 2019-08-11 DIAGNOSIS — E785 Hyperlipidemia, unspecified: Secondary | ICD-10-CM | POA: Diagnosis not present

## 2019-08-11 DIAGNOSIS — I10 Essential (primary) hypertension: Secondary | ICD-10-CM

## 2019-08-11 DIAGNOSIS — Z6841 Body Mass Index (BMI) 40.0 and over, adult: Secondary | ICD-10-CM

## 2019-08-11 DIAGNOSIS — E1122 Type 2 diabetes mellitus with diabetic chronic kidney disease: Secondary | ICD-10-CM

## 2019-08-11 DIAGNOSIS — N181 Chronic kidney disease, stage 1: Secondary | ICD-10-CM

## 2019-08-11 NOTE — Progress Notes (Signed)
Location:  [facility] clinic  Provider: Dr. Hollace Kinnier  Code Status:  Goals of Care:  Advanced Directives 08/11/2019  Does Patient Have a Medical Advance Directive? Yes  Type of Advance Directive Living will;Healthcare Power of Attorney  Does patient want to make changes to medical advance directive? No - Patient declined  Copy of [place] in Chart? Yes - validated most recent copy scanned in chart (See row information)  Would patient like information on creating a medical advance directive? -     Chief Complaint  Patient presents with  . Medical Management of Chronic Issues    4 month follow up. No concerns today.   Marland Kitchen Health Maintenance    TDAP has already been sent to pharm. patient has not went yet.     HPI: Patient is a 78 y.o. female seen today for medical management of chronic diseases.    Still concerned about her weight. She claims she drinks too much coffee creamer. Also eating too much breads. She will have at least 2-3 servings of bread daily. Does not drink soda.   She will stay active doing chores around her house. Her exercise center has just reopened and she plans on going soon.   Her left knee will be painful and swollen at times. When this occurs, she is not as active. She will elevate her legs to help with swelling. Denies taking any medicine to help with pain.   Her husband is still at [place]. She is concerned about his declining health. In an effort to cope she tried to stay busy. She sees her son often and other friends.   Education about tetanus vaccine discussed.   Past Medical History:  Diagnosis Date  . Arthritis    Ankle  . Asymptomatic varicose veins   . Benign neoplasm of colon   . Bilateral shoulder pain 07/02/14  . Cataract    Bilateral - just watching  . Cervicitis and endocervicitis 09/13/2011  . Disorder of bone and cartilage, unspecified   . Disorders of bursae and tendons in shoulder region,  unspecified   . External hemorrhoids without mention of complication   . Fibrocystic breast   . Generalized osteoarthrosis, unspecified site   . GERD (gastroesophageal reflux disease)    diet controlled, No meds  . Hiatal hernia   . Hypertension   . Impacted cerumen 11/07/2011  . Obesity, unspecified   . Other diseases of nasal cavity and sinuses(478.19)   . Pain in joint, ankle and foot   . Pain in joint, pelvic region and thigh   . Pain in joint, shoulder region   . Reflux esophagitis   . SVD (spontaneous vaginal delivery)    x 2  . Unspecified essential hypertension   . Unspecified vitamin D deficiency     Past Surgical History:  Procedure Laterality Date  . COLONOSCOPY  2006   Dr.Orr  . COLONOSCOPY  07/08/2013   Dr. Carlean Purl  . [place]   Hemorrhoids   . MOUTH SURGERY  2005   DR LUTINS - tooth ext and gum surgery  . SHOULDER ARTHROSCOPY W/ ACROMIAL REPAIR Right 07/02/14   Dr. Durward Fortes  . UPPER GASTROINTESTINAL ENDOSCOPY     normal    No Known Allergies  Outpatient Encounter Medications as of 08/11/2019  Medication Sig  . aspirin 81 MG tablet Take 81 mg by mouth daily. Take 1 tablet once a day.  . Calcium Carbonate-Vitamin D (CALCIUM-VITAMIN D) 500-200  MG-UNIT per tablet Take 1 tablet by mouth daily. Take 1 tablet twice a day to help bones.   . Carboxymethylcell-Hypromellose (GENTEAL) 0.25-0.3 % GEL Apply 1 application to eye daily. Use 1 application to each eye as needed to alleviate irritation.  . Cholecalciferol (VITAMIN D-3 PO) Take 1 tablet by mouth daily.   . hydrocortisone (ANUSOL-HC) 25 MG suppository INSERT 1 SUPPOSITORY PER RECTUM ONCE DAILY FOR HEMORRHOIDS.  Marland Kitchen irbesartan-hydrochlorothiazide (AVALIDE) 150-12.5 MG tablet Take 1 tablet by mouth daily.  . vitamin B-12 (CYANOCOBALAMIN) 1000 MCG tablet Take 1,000 mcg by mouth daily. Take 1 tablet once a day.  . vitamin C (ASCORBIC ACID) 500 MG tablet Take 500 mg by mouth daily. Take 1 tablet  once a day.  . vitamin E 400 UNIT capsule Take 400 Units by mouth daily. Take 1 capsule once a day.   No facility-administered encounter medications on file as of 08/11/2019.     Review of Systems:  Review of Systems  Constitutional: Negative for activity change, appetite change and fatigue.  Respiratory: Negative for cough, shortness of breath and wheezing.   Cardiovascular: Negative for chest pain and leg swelling.  Gastrointestinal: Negative for abdominal pain, constipation, diarrhea and nausea.  Endocrine: Negative for polydipsia, polyphagia and polyuria.  Genitourinary: Negative for dysuria, frequency and hematuria.  Musculoskeletal:       Left knee pain  Skin: Negative.   Neurological: Negative for dizziness and headaches.  Psychiatric/Behavioral: Negative for dysphoric mood and sleep disturbance. The patient is not nervous/anxious.     Health Maintenance  Topic Date Due  . TETANUS/TDAP  04/30/2008  . OPHTHALMOLOGY EXAM  11/12/2019  . FOOT EXAM  11/15/2019  . HEMOGLOBIN A1C  02/03/2020  . DEXA SCAN  Completed  . PNA vac Low Risk Adult  Completed    Physical Exam: Vitals:   08/11/19 1211  BP: 128/80  Pulse: 67  Temp: 97.7 F (36.5 C)  SpO2: 99%  Weight: 243 lb 12.8 oz (110.6 kg)  Height: 5\' 3"  (1.6 m)   Body mass index is 43.19 kg/m. Physical Exam Vitals signs reviewed.  Constitutional:      Appearance: Normal appearance.  Cardiovascular:     Rate and Rhythm: Normal rate and regular rhythm.     Pulses: Normal pulses.     Heart sounds: Normal heart sounds. No murmur.  Pulmonary:     Effort: Pulmonary effort is normal. No respiratory distress.     Breath sounds: Normal breath sounds. No wheezing.  Abdominal:     General: Bowel sounds are normal. There is no distension.     Palpations: Abdomen is soft.     Tenderness: There is no abdominal tenderness.  Musculoskeletal: Normal range of motion.     Right lower leg: No edema.     Left lower leg: Edema  present.     Left foot: Tenderness and swelling present.  Skin:    General: Skin is warm and dry.     Capillary Refill: Capillary refill takes less than 2 seconds.  Neurological:     General: No focal deficit present.     Mental Status: She is alert and oriented to person, place, and time. Mental status is at baseline.  Psychiatric:        Mood and Affect: Mood normal.        Behavior: Behavior normal.        Thought Content: Thought content normal.        Judgment: Judgment normal.  Labs reviewed: Basic Metabolic Panel: Recent Labs    10/14/18 0834 08/06/19 0837  NA 143 143  K 4.0 3.9  CL 107 108  CO2 27 28  GLUCOSE 107* 116*  BUN 13 14  CREATININE 0.99* 1.06*  CALCIUM 9.1 9.1   Liver Function Tests: No results for input(s): AST, ALT, ALKPHOS, BILITOT, PROT, ALBUMIN in the last 8760 hours. No results for input(s): LIPASE, AMYLASE in the last 8760 hours. No results for input(s): AMMONIA in the last 8760 hours. CBC: Recent Labs    10/14/18 0834  WBC 7.3  NEUTROABS 4,088  HGB 12.7  HCT 37.1  MCV 91.4  PLT 210   Lipid Panel: Recent Labs    10/14/18 0834 04/10/19 0827 08/06/19 0837  CHOL 188 210* 213*  HDL 56 61 58  LDLCALC 115* 132* 140*  TRIG 78 74 60  CHOLHDL 3.4 3.4 3.7   Lab Results  Component Value Date   HGBA1C 6.0 (H) 08/06/2019    Procedures since last visit: No results found.  Assessment/Plan 1. Type 2 diabetes mellitus with stage 1 chronic kidney disease, without long-term current use of insulin (HCC) - hemoglobin A1C still in prediabetic range.  - she is still eating a high sugar and carbohydrate diet - recommend food diary to track sugar and carbs - limit coffee creamer - recheck hemoglobin A1C in 3 months  2. Essential hypertension - bp at goal <150/90 - continue to limit sodium in diet - continue current medication regimen  3. Hyperlipidemia, unspecified hyperlipidemia type - LDL remains high - educated about low fat diet  and restriction of fried foods - encourage exercise 240 minutes weekly - lipid panel- future  4. Body mass index (BMI) of 40.1-44.9 in adult [facility]) - encourage counting calories - food diary - light exercise 240 minutes weekly- encourage walking or swimming  5. Bleeding external hemorrhoids - stable - encourage daily dose of vegetables in diet - encourage stool softner daily if constipation/hard stools persists due to poor diet   Labs/tests ordered:  Lipid panel, hemoglobin A1C- future Next appt:  4 month follow up

## 2019-08-11 NOTE — Patient Instructions (Signed)
Please go to the pharmacy to get your tdap vaccine to prevent tetanus and whooping cough.

## 2019-08-15 ENCOUNTER — Other Ambulatory Visit: Payer: Self-pay

## 2019-08-15 DIAGNOSIS — Z20822 Contact with and (suspected) exposure to covid-19: Secondary | ICD-10-CM

## 2019-08-18 LAB — NOVEL CORONAVIRUS, NAA: SARS-CoV-2, NAA: NOT DETECTED

## 2019-10-01 ENCOUNTER — Other Ambulatory Visit: Payer: Self-pay | Admitting: Internal Medicine

## 2019-11-04 ENCOUNTER — Other Ambulatory Visit: Payer: Self-pay | Admitting: Internal Medicine

## 2019-12-08 ENCOUNTER — Other Ambulatory Visit: Payer: Self-pay

## 2019-12-08 ENCOUNTER — Other Ambulatory Visit: Payer: Medicare PPO

## 2019-12-08 DIAGNOSIS — E785 Hyperlipidemia, unspecified: Secondary | ICD-10-CM

## 2019-12-08 DIAGNOSIS — N181 Chronic kidney disease, stage 1: Secondary | ICD-10-CM | POA: Diagnosis not present

## 2019-12-08 DIAGNOSIS — E1122 Type 2 diabetes mellitus with diabetic chronic kidney disease: Secondary | ICD-10-CM

## 2019-12-08 LAB — LIPID PANEL
Cholesterol: 209 mg/dL — ABNORMAL HIGH (ref <200)
HDL: 62 mg/dL (ref >=50)
LDL Cholesterol (Calc): 132 mg/dL (calc) — ABNORMAL HIGH
Non-HDL Cholesterol (Calc): 147 mg/dL (calc) — ABNORMAL HIGH (ref <130)
Total CHOL/HDL Ratio: 3.4 (calc) (ref <5.0)
Triglycerides: 60 mg/dL (ref <150)

## 2019-12-08 NOTE — Progress Notes (Signed)
Bad cholesterol has improved some since her last labs.  Hopefully, it will continue to trend this direction--we will discuss at her visit.

## 2019-12-11 ENCOUNTER — Encounter: Payer: Self-pay | Admitting: Internal Medicine

## 2019-12-11 ENCOUNTER — Other Ambulatory Visit: Payer: Self-pay

## 2019-12-11 ENCOUNTER — Ambulatory Visit (INDEPENDENT_AMBULATORY_CARE_PROVIDER_SITE_OTHER): Payer: Medicare PPO | Admitting: Internal Medicine

## 2019-12-11 VITALS — BP 136/82 | HR 63 | Temp 97.9°F | Ht 63.0 in | Wt 250.0 lb

## 2019-12-11 DIAGNOSIS — I10 Essential (primary) hypertension: Secondary | ICD-10-CM

## 2019-12-11 DIAGNOSIS — K644 Residual hemorrhoidal skin tags: Secondary | ICD-10-CM

## 2019-12-11 DIAGNOSIS — Z6841 Body Mass Index (BMI) 40.0 and over, adult: Secondary | ICD-10-CM | POA: Diagnosis not present

## 2019-12-11 DIAGNOSIS — N181 Chronic kidney disease, stage 1: Secondary | ICD-10-CM | POA: Diagnosis not present

## 2019-12-11 DIAGNOSIS — E785 Hyperlipidemia, unspecified: Secondary | ICD-10-CM | POA: Diagnosis not present

## 2019-12-11 DIAGNOSIS — E1122 Type 2 diabetes mellitus with diabetic chronic kidney disease: Secondary | ICD-10-CM

## 2019-12-11 DIAGNOSIS — M25472 Effusion, left ankle: Secondary | ICD-10-CM | POA: Diagnosis not present

## 2019-12-11 MED ORDER — IRBESARTAN-HYDROCHLOROTHIAZIDE 150-12.5 MG PO TABS: 1.0000 | ORAL_TABLET | Freq: Every day | ORAL | 3 refills | Status: DC

## 2019-12-11 MED ORDER — HYDROCORTISONE ACETATE 25 MG RE SUPP: 25.0000 mg | Freq: Every day | RECTAL | 0 refills | Status: DC | PRN

## 2019-12-11 NOTE — Patient Instructions (Addendum)
Tucks pads can help. Anusol or preparation H cream may help with burning/irritation.  Please get [place] a copy of your health care power of attorney and living will.  Be sure you are wearing supportive sneakers to walk on the treadmill.

## 2019-12-11 NOTE — Progress Notes (Signed)
Location:  [facility] clinic Provider:  Winter Jocelyn L. Mariea Clonts, D.O., C.M.D.  Goals of Care:  Advanced Directives 12/11/2019  Does Patient Have a Medical Advance Directive? Yes  Type of Advance Directive [place]  Does patient want to make changes to medical advance directive? No - Patient declined  Copy of [place] in Chart? -  Would patient like information on creating a medical advance directive? -  reminded her to bring her advance directives next time or fax to [place]  Chief Complaint  Patient presents with  . Medical Management of Chronic Issues    4 month follow up, discuss lab results    HPI: Patient is a 79 y.o. female seen today for medical management of chronic diseases.    Her hemorrhoids sometimes burn.  She's been using the suppositories.  She tries not to get constipated.  No bleeding.  Has not tried creams or tucks pads.    She's back on the treadmill since late January.  Her weight has gone up to 250 lbs.  People sent her food when [place] died.  Her children were there.  She tried to resist.  She's doing better on eating habits--is back to her stirfry, has cheerios for breakfast.  She's cut out bread--had been doing cheese toast.    She is sleeping ok.  Stays up until 2am looking at news.  Then cannot sleep past 7am.  If she sits in the chair, she does get a nap.  She's looking forward to getting outside in her yard.  When she worked at [employer] she got exercise.  Had her covid vaccines.    Past Medical History:  Diagnosis Date  . Arthritis    Ankle  . Asymptomatic varicose veins   . Benign neoplasm of colon   . Bilateral shoulder pain 07/02/14  . Cataract    Bilateral - just watching  . Cervicitis and endocervicitis 09/13/2011  . Disorder of bone and cartilage, unspecified   . Disorders of bursae and tendons in shoulder region, unspecified   . External hemorrhoids without mention of complication   . Fibrocystic breast   . Generalized  osteoarthrosis, unspecified site   . GERD (gastroesophageal reflux disease)    diet controlled, No meds  . Hiatal hernia   . Hypertension   . Impacted cerumen 11/07/2011  . Obesity, unspecified   . Other diseases of nasal cavity and sinuses(478.19)   . Pain in joint, ankle and foot   . Pain in joint, pelvic region and thigh   . Pain in joint, shoulder region   . Reflux esophagitis   . SVD (spontaneous vaginal delivery)    x 2  . Unspecified essential hypertension   . Unspecified vitamin D deficiency     Past Surgical History:  Procedure Laterality Date  . COLONOSCOPY  2006   Dr.Orr  . COLONOSCOPY  07/08/2013   Dr. Carlean Purl  . [place]   Hemorrhoids   . MOUTH SURGERY  2005   DR LUTINS - tooth ext and gum surgery  . SHOULDER ARTHROSCOPY W/ ACROMIAL REPAIR Right 07/02/14   Dr. Durward Fortes  . UPPER GASTROINTESTINAL ENDOSCOPY     normal    No Known Allergies  Outpatient Encounter Medications as of 12/11/2019  Medication Sig  . aspirin 81 MG tablet Take 81 mg by mouth daily. Take 1 tablet once a day.  . Calcium Carbonate-Vitamin D (CALCIUM-VITAMIN D) 500-200 MG-UNIT per tablet Take 1 tablet by mouth daily.  Take 1 tablet twice a day to help bones.   . Carboxymethylcell-Hypromellose (GENTEAL) 0.25-0.3 % GEL Apply 1 application to eye daily. Use 1 application to each eye as needed to alleviate irritation.  . Cholecalciferol (VITAMIN D-3 PO) Take 1 tablet by mouth daily.   . hydrocortisone (ANUSOL-HC) 25 MG suppository INSERT 1 SUPPOSITORY PER RECTUM ONCE DAILY FOR HEMORRHOIDS.  Marland Kitchen irbesartan-hydrochlorothiazide (AVALIDE) 150-12.5 MG tablet Take 1 tablet by mouth daily.  Marland Kitchen losartan-hydrochlorothiazide (HYZAAR) 50-12.5 MG tablet TAKE 1 TABLET ONCE DAILY.  . vitamin B-12 (CYANOCOBALAMIN) 1000 MCG tablet Take 1,000 mcg by mouth daily. Take 1 tablet once a day.  . vitamin C (ASCORBIC ACID) 500 MG tablet Take 500 mg by mouth daily. Take 1 tablet once a day.  . vitamin E  400 UNIT capsule Take 400 Units by mouth daily. Take 1 capsule once a day.   No facility-administered encounter medications on file as of 12/11/2019.    Review of Systems:  Review of Systems  Constitutional: Negative for chills, fever and malaise/fatigue.       Wt gain  HENT: Negative for congestion, hearing loss and sore throat.   Eyes: Negative for blurred vision.  Respiratory: Negative for cough and shortness of breath.   Cardiovascular: Negative for chest pain, palpitations and leg swelling.  Gastrointestinal: Negative for abdominal pain, blood in stool, constipation, diarrhea and melena.       Hemorrhoids burning  Genitourinary: Negative for dysuria.  Musculoskeletal: Positive for joint pain. Negative for falls.       Left ankle, knees  Skin: Negative for itching and rash.  Neurological: Negative for dizziness and loss of consciousness.  Endo/Heme/Allergies: Does not bruise/bleed easily.  Psychiatric/Behavioral: Negative for depression and memory loss. The patient is not nervous/anxious and does not have insomnia.     Health Maintenance  Topic Date Due  . TETANUS/TDAP  04/30/2008  . OPHTHALMOLOGY EXAM  11/12/2019  . FOOT EXAM  11/15/2019  . HEMOGLOBIN A1C  02/03/2020  . DEXA SCAN  Completed  . PNA vac Low Risk Adult  Completed    Physical Exam: Vitals:   12/11/19 1135  BP: 136/82  Temp: 97.9 F (36.6 C)   There is no height or weight on file to calculate BMI. Physical Exam Vitals reviewed.  Constitutional:      General: She is not in acute distress.    Appearance: Normal appearance. She is obese. She is not toxic-appearing.  HENT:     Head: Normocephalic and atraumatic.  Cardiovascular:     Rate and Rhythm: Normal rate and regular rhythm.     Pulses: Normal pulses.     Heart sounds: Normal heart sounds.  Pulmonary:     Effort: Pulmonary effort is normal.     Breath sounds: Normal breath sounds. No wheezing, rhonchi or rales.  Abdominal:     General: Bowel  sounds are normal.     Palpations: Abdomen is soft.  Musculoskeletal:        General: Normal range of motion.     Comments: Mild swelling left ankle and lateral tenderness  Skin:    General: Skin is warm and dry.  Neurological:     General: No focal deficit present.     Mental Status: She is alert and oriented to person, place, and time.  Psychiatric:        Mood and Affect: Mood normal.        Behavior: Behavior normal.        Thought Content:  Thought content normal.        Judgment: Judgment normal.     Labs reviewed: Basic Metabolic Panel: Recent Labs    08/06/19 0837  NA 143  K 3.9  CL 108  CO2 28  GLUCOSE 116*  BUN 14  CREATININE 1.06*  CALCIUM 9.1   Liver Function Tests: No results for input(s): AST, ALT, ALKPHOS, BILITOT, PROT, ALBUMIN in the last 8760 hours. No results for input(s): LIPASE, AMYLASE in the last 8760 hours. No results for input(s): AMMONIA in the last 8760 hours. CBC: No results for input(s): WBC, NEUTROABS, HGB, HCT, MCV, PLT in the last 8760 hours. Lipid Panel: Recent Labs    04/10/19 0827 08/06/19 0837 12/08/19 0832  CHOL 210* 213* 209*  HDL 61 58 62  LDLCALC 132* 140* 132*  TRIG 74 60 60  CHOLHDL 3.4 3.7 3.4   Lab Results  Component Value Date   HGBA1C 6.0 (H) 08/06/2019    Assessment/Plan 1. Essential hypertension - cont current regimen - encouraged exercise for weight loss to help keep this down - irbesartan-hydrochlorothiazide (AVALIDE) 150-12.5 MG tablet; Take 1 tablet by mouth daily.  Dispense: 90 tablet; Refill: 3  2. Type 2 diabetes mellitus with stage 1 chronic kidney disease, without long-term current use of insulin (HCC) -is back to eating healthier again and getting back on track with self-care after her husband's death -control is good  3. Hyperlipidemia, unspecified hyperlipidemia type -cont current regimen and improved eating, exercise  4. Body mass index (BMI) of 40.1-44.9 in adult [facility]) -with morbid  obesity--she was upset herself today with her weight and clearly appeared motivated to lose  5. Bleeding external hemorrhoids -counseled on  Tucks pads, anusol or preparation H cream for itching/burning and suppositories for pain/inflammation/bleeding  6. Left ankle swelling -worse when walks on treadmill, but not wearing proper shoewear--she's going to get out the recommended sneakers  Labs/tests ordered:   Lab Orders     CBC with Differential/Platelet     COMPLETE METABOLIC PANEL WITH GFR     Lipid panel     Hemoglobin A1c  Next appt:  04/12/2020 for CPE with fasting labs before  Sadik Piascik L. Clay Solum, D.O. [facility] [address], [place] [address] Cell Phone (Mon-Fri 8am-5pm):  [phone] On Call:  [phone] & follow prompts after 5pm & weekends Office Phone:  [phone] Office Fax:  [phone]

## 2019-12-17 ENCOUNTER — Telehealth: Payer: Self-pay | Admitting: *Deleted

## 2019-12-17 NOTE — Telephone Encounter (Signed)
Somehow both were on her med list.  I had gotten a paper fax from the pharmacy that I already responded to that said I was ok with her continuing the losartan/hctz so please irbesartan/hctz.

## 2019-12-17 NOTE — Telephone Encounter (Signed)
Pharmacy called and stated that they received a rx for irbesartan Hydrochlorothiazide with a comment of "To replace losartan/hctz due to back order"  Pharmacy stated that they have always refilled her Losartan and it was no backorder. Stated that they spoke with patient and she prefers to stay with what she had been taking.   Please Advise.

## 2019-12-19 ENCOUNTER — Other Ambulatory Visit: Payer: Self-pay | Admitting: *Deleted

## 2019-12-22 ENCOUNTER — Other Ambulatory Visit: Payer: Self-pay | Admitting: Internal Medicine

## 2019-12-22 MED ORDER — LOSARTAN POTASSIUM-HCTZ 50-12.5 MG PO TABS: 1.0000 | ORAL_TABLET | Freq: Every day | ORAL | 1 refills | Status: DC

## 2019-12-22 NOTE — Telephone Encounter (Signed)
Patient notified and agreed. Medication list updated and Rx sent to Pharmacy.  °

## 2019-12-22 NOTE — Telephone Encounter (Signed)
Patient has not been on this medication since 2018/2019

## 2019-12-25 ENCOUNTER — Other Ambulatory Visit: Payer: Self-pay | Admitting: Internal Medicine

## 2020-03-23 DIAGNOSIS — H25013 Cortical age-related cataract, bilateral: Secondary | ICD-10-CM | POA: Diagnosis not present

## 2020-03-23 DIAGNOSIS — H52203 Unspecified astigmatism, bilateral: Secondary | ICD-10-CM | POA: Diagnosis not present

## 2020-03-23 DIAGNOSIS — H524 Presbyopia: Secondary | ICD-10-CM | POA: Diagnosis not present

## 2020-03-23 DIAGNOSIS — H2513 Age-related nuclear cataract, bilateral: Secondary | ICD-10-CM | POA: Diagnosis not present

## 2020-04-08 ENCOUNTER — Other Ambulatory Visit: Payer: Self-pay

## 2020-04-08 ENCOUNTER — Other Ambulatory Visit: Payer: Medicare PPO

## 2020-04-08 ENCOUNTER — Other Ambulatory Visit: Payer: Self-pay | Admitting: Internal Medicine

## 2020-04-08 DIAGNOSIS — E785 Hyperlipidemia, unspecified: Secondary | ICD-10-CM

## 2020-04-08 DIAGNOSIS — E1122 Type 2 diabetes mellitus with diabetic chronic kidney disease: Secondary | ICD-10-CM

## 2020-04-08 DIAGNOSIS — Z1231 Encounter for screening mammogram for malignant neoplasm of breast: Secondary | ICD-10-CM | POA: Diagnosis not present

## 2020-04-08 DIAGNOSIS — N181 Chronic kidney disease, stage 1: Secondary | ICD-10-CM

## 2020-04-09 LAB — COMPLETE METABOLIC PANEL WITH GFR
AG Ratio: 1.7 (calc) (ref 1.0–2.5)
ALT: 12 U/L (ref 6–29)
AST: 23 U/L (ref 10–35)
Albumin: 4.1 g/dL (ref 3.6–5.1)
Alkaline phosphatase (APISO): 85 U/L (ref 37–153)
BUN/Creatinine Ratio: 12 (calc) (ref 6–22)
BUN: 12 mg/dL (ref 7–25)
CO2: 26 mmol/L (ref 20–32)
Calcium: 9.5 mg/dL (ref 8.6–10.4)
Chloride: 108 mmol/L (ref 98–110)
Creat: 1.01 mg/dL — ABNORMAL HIGH (ref 0.60–0.93)
GFR, Est African American: 62 mL/min/{1.73_m2} (ref >=60)
GFR, Est Non African American: 54 mL/min/{1.73_m2} — ABNORMAL LOW (ref >=60)
Globulin: 2.4 g/dL (calc) (ref 1.9–3.7)
Glucose, Bld: 101 mg/dL — ABNORMAL HIGH (ref 65–99)
Potassium: 4 mmol/L (ref 3.5–5.3)
Sodium: 142 mmol/L (ref 135–146)
Total Bilirubin: 0.6 mg/dL (ref 0.2–1.2)
Total Protein: 6.5 g/dL (ref 6.1–8.1)

## 2020-04-09 LAB — CBC WITH DIFFERENTIAL/PLATELET
Absolute Monocytes: 650 cells/uL (ref 200–950)
Basophils Absolute: 51 cells/uL (ref 0–200)
Basophils Relative: 0.7 %
Eosinophils Absolute: 131 cells/uL (ref 15–500)
Eosinophils Relative: 1.8 %
HCT: 38.7 % (ref 35.0–45.0)
Hemoglobin: 13.1 g/dL (ref 11.7–15.5)
Lymphs Abs: 2657 cells/uL (ref 850–3900)
MCH: 31.6 pg (ref 27.0–33.0)
MCHC: 33.9 g/dL (ref 32.0–36.0)
MCV: 93.3 fL (ref 80.0–100.0)
MPV: 10.9 fL (ref 7.5–12.5)
Monocytes Relative: 8.9 %
Neutro Abs: 3811 cells/uL (ref 1500–7800)
Neutrophils Relative %: 52.2 %
Platelets: 193 10*3/uL (ref 140–400)
RBC: 4.15 10*6/uL (ref 3.80–5.10)
RDW: 14.3 % (ref 11.0–15.0)
Total Lymphocyte: 36.4 %
WBC: 7.3 10*3/uL (ref 3.8–10.8)

## 2020-04-09 LAB — LIPID PANEL
Cholesterol: 200 mg/dL — ABNORMAL HIGH (ref <200)
HDL: 57 mg/dL (ref >=50)
LDL Cholesterol (Calc): 127 mg/dL (calc) — ABNORMAL HIGH
Non-HDL Cholesterol (Calc): 143 mg/dL (calc) — ABNORMAL HIGH (ref <130)
Total CHOL/HDL Ratio: 3.5 (calc) (ref <5.0)
Triglycerides: 67 mg/dL (ref <150)

## 2020-04-09 LAB — HEMOGLOBIN A1C
Hgb A1c MFr Bld: 6 % of total Hgb — ABNORMAL HIGH (ref <5.7)
Mean Plasma Glucose: 126 (calc)
eAG (mmol/L): 7 (calc)

## 2020-04-09 NOTE — Progress Notes (Signed)
Sugar average is stable in prediabetic range at 6. Bad cholesterol is improved slightly.  We'll discuss treatment for this at her appt. Blood counts are all normal.  Electrolytes normal.  Kidneys are stable.

## 2020-04-12 ENCOUNTER — Encounter: Payer: Medicare PPO | Admitting: Internal Medicine

## 2020-04-19 ENCOUNTER — Ambulatory Visit (INDEPENDENT_AMBULATORY_CARE_PROVIDER_SITE_OTHER): Payer: Medicare PPO | Admitting: Internal Medicine

## 2020-04-19 ENCOUNTER — Other Ambulatory Visit: Payer: Self-pay

## 2020-04-19 ENCOUNTER — Encounter: Payer: Self-pay | Admitting: Internal Medicine

## 2020-04-19 VITALS — BP 122/78 | HR 70 | Temp 97.1°F | Ht 63.0 in | Wt 248.1 lb

## 2020-04-19 DIAGNOSIS — Z0000 Encounter for general adult medical examination without abnormal findings: Secondary | ICD-10-CM | POA: Diagnosis not present

## 2020-04-19 DIAGNOSIS — I10 Essential (primary) hypertension: Secondary | ICD-10-CM | POA: Diagnosis not present

## 2020-04-19 DIAGNOSIS — Z6841 Body Mass Index (BMI) 40.0 and over, adult: Secondary | ICD-10-CM | POA: Diagnosis not present

## 2020-04-19 DIAGNOSIS — E1122 Type 2 diabetes mellitus with diabetic chronic kidney disease: Secondary | ICD-10-CM

## 2020-04-19 DIAGNOSIS — Z23 Encounter for immunization: Secondary | ICD-10-CM | POA: Diagnosis not present

## 2020-04-19 DIAGNOSIS — Z1159 Encounter for screening for other viral diseases: Secondary | ICD-10-CM | POA: Diagnosis not present

## 2020-04-19 DIAGNOSIS — N181 Chronic kidney disease, stage 1: Secondary | ICD-10-CM

## 2020-04-19 DIAGNOSIS — M65351 Trigger finger, right little finger: Secondary | ICD-10-CM | POA: Diagnosis not present

## 2020-04-19 DIAGNOSIS — E785 Hyperlipidemia, unspecified: Secondary | ICD-10-CM

## 2020-04-19 NOTE — Patient Instructions (Signed)
[facility] [address] [place], [place] [address] [phone]

## 2020-04-19 NOTE — Progress Notes (Signed)
Location:  [facility] clinic Provider:  Laityn Bensen L. Mariea Clonts, D.O., C.M.D.  Code Status: need to review Goals of Care:  Advanced Directives 04/19/2020  Does Patient Have a Medical Advance Directive? Yes  Type of Advance Directive -  Does patient want to make changes to medical advance directive? No - Patient declined  Copy of [place] in Chart? -  Would patient like information on creating a medical advance directive? -  said she'd fax [place] her ACP docs  Chief Complaint  Patient presents with  . Annual Exam    Annual physical exam     HPI: Patient is a 79 y.o. female seen today for medical management of chronic diseases.    Has been getting numbness of her fingers after holding on tightly to the treadmill. She has had her 4th right digit stick quite a bit and is wanting to go back to Dr. Durward Fortes about this--new location provided.  She's been going on the treadmill 30 mins each day.  Cuts the front yard one day and back one day.  She is down 2 lbs.  Feels better doing these things.  She had to find a time when it was pretty empty.  Going a little before  12 noon or at 3pm.    She saw Dr. Gershon Crane and she has cataracts and does not need surgery yet.     She agrees to pay for tdap if it's not covered here.  Past Medical History:  Diagnosis Date  . Arthritis    Ankle  . Asymptomatic varicose veins   . Benign neoplasm of colon   . Bilateral shoulder pain 07/02/14  . Cataract    Bilateral - just watching  . Cervicitis and endocervicitis 09/13/2011  . Disorder of bone and cartilage, unspecified   . Disorders of bursae and tendons in shoulder region, unspecified   . External hemorrhoids without mention of complication   . Fibrocystic breast   . Generalized osteoarthrosis, unspecified site   . GERD (gastroesophageal reflux disease)    diet controlled, No meds  . Hiatal hernia   . Hypertension   . Impacted cerumen 11/07/2011  . Obesity, unspecified   . Other diseases  of nasal cavity and sinuses(478.19)   . Pain in joint, ankle and foot   . Pain in joint, pelvic region and thigh   . Pain in joint, shoulder region   . Reflux esophagitis   . SVD (spontaneous vaginal delivery)    x 2  . Unspecified essential hypertension   . Unspecified vitamin D deficiency     Past Surgical History:  Procedure Laterality Date  . COLONOSCOPY  2006   Dr.Orr  . COLONOSCOPY  07/08/2013   Dr. Carlean Purl  . [employer]   Hemorrhoids   . MOUTH SURGERY  2005   DR LUTINS - tooth ext and gum surgery  . SHOULDER ARTHROSCOPY W/ ACROMIAL REPAIR Right 07/02/14   Dr. Durward Fortes  . UPPER GASTROINTESTINAL ENDOSCOPY     normal    No Known Allergies  Outpatient Encounter Medications as of 04/19/2020  Medication Sig  . aspirin 81 MG tablet Take 81 mg by mouth daily. Take 1 tablet once a day.  . Calcium Carbonate-Vitamin D (CALCIUM-VITAMIN D) 500-200 MG-UNIT per tablet Take 1 tablet by mouth daily. Take 1 tablet twice a day to help bones.   . Carboxymethylcell-Hypromellose (GENTEAL) 0.25-0.3 % GEL Apply 1 application to eye daily. Use 1 application to each eye as needed to  alleviate irritation.  . Cholecalciferol (VITAMIN D-3 PO) Take 1 tablet by mouth daily.   . hydrocortisone (ANUSOL-HC) 25 MG suppository Place 1 suppository (25 mg total) rectally daily as needed for hemorrhoids or anal itching.  Marland Kitchen ibuprofen (ADVIL) 800 MG tablet TAKE (1) TABLET FOUR TIMES DAILY AS NEEDED FOR PAIN.  Marland Kitchen losartan-hydrochlorothiazide (HYZAAR) 50-12.5 MG tablet TAKE 1 TABLET ONCE DAILY.  . vitamin B-12 (CYANOCOBALAMIN) 1000 MCG tablet Take 1,000 mcg by mouth daily. Take 1 tablet once a day.  . vitamin C (ASCORBIC ACID) 500 MG tablet Take 500 mg by mouth daily. Take 1 tablet once a day.  . vitamin E 400 UNIT capsule Take 400 Units by mouth daily. Take 1 capsule once a day.   No facility-administered encounter medications on file as of 04/19/2020.    Review of Systems:  Review of  Systems  Constitutional: Positive for weight loss. Negative for chills, fever and malaise/fatigue.  HENT: Positive for hearing loss. Negative for congestion and sore throat.        Cerumen; has been to dentist and needs a root canal and another tooth worked on  Eyes: Negative for blurred vision.  Respiratory: Negative for cough and shortness of breath.   Cardiovascular: Negative for chest pain, palpitations and leg swelling.       Left ankle swells since prior injury  Gastrointestinal: Negative for abdominal pain, blood in stool, constipation, diarrhea and melena.  Genitourinary: Negative for dysuria.  Musculoskeletal: Positive for joint pain. Negative for back pain and falls.  Skin: Negative for itching and rash.  Neurological: Negative for dizziness and loss of consciousness.  Endo/Heme/Allergies: Negative for environmental allergies.  Psychiatric/Behavioral: Negative for depression and memory loss. The patient is not nervous/anxious and does not have insomnia.     Health Maintenance  Topic Date Due  . Hepatitis C Screening  Never done  . TETANUS/TDAP  04/30/2008  . FOOT EXAM  11/15/2019  . HEMOGLOBIN A1C  10/09/2020  . OPHTHALMOLOGY EXAM  03/23/2021  . DEXA SCAN  Completed  . COVID-19 Vaccine  Completed  . PNA vac Low Risk Adult  Completed    Physical Exam: Vitals:   04/19/20 1458  BP: 122/78  Pulse: 70  Temp: (!) 97.1 F (36.2 C)  TempSrc: Temporal  SpO2: 97%  Weight: 248 lb 1.6 oz (112.5 kg)  Height: 5\' 3"  (1.6 m)   Body mass index is 43.95 kg/m. Physical Exam Vitals reviewed.  Constitutional:      General: She is not in acute distress.    Appearance: Normal appearance. She is obese. She is not ill-appearing or toxic-appearing.  HENT:     Head: Normocephalic and atraumatic.     Right Ear: There is impacted cerumen.     Left Ear: There is impacted cerumen.     Nose: Nose normal.     Mouth/Throat:     Pharynx: Oropharynx is clear.  Eyes:     Extraocular  Movements: Extraocular movements intact.     Conjunctiva/sclera: Conjunctivae normal.     Pupils: Pupils are equal, round, and reactive to light.  Cardiovascular:     Rate and Rhythm: Normal rate and regular rhythm.     Pulses: Normal pulses.     Heart sounds: Normal heart sounds.  Pulmonary:     Effort: Pulmonary effort is normal.     Breath sounds: Normal breath sounds. No wheezing, rhonchi or rales.  Abdominal:     General: Bowel sounds are normal. There is no distension.  Palpations: Abdomen is soft.     Tenderness: There is no abdominal tenderness. There is no guarding or rebound.  Musculoskeletal:        General: Normal range of motion.     Cervical back: Neck supple.     Right lower leg: No edema.     Left lower leg: No edema.  Lymphadenopathy:     Cervical: No cervical adenopathy.  Skin:    General: Skin is warm and dry.     Capillary Refill: Capillary refill takes less than 2 seconds.  Neurological:     General: No focal deficit present.     Mental Status: She is alert and oriented to person, place, and time.     Cranial Nerves: No cranial nerve deficit.     Sensory: No sensory deficit.     Motor: No weakness.     Coordination: Coordination normal.     Gait: Gait normal.     Deep Tendon Reflexes: Reflexes normal.  Psychiatric:        Mood and Affect: Mood normal.        Behavior: Behavior normal.        Thought Content: Thought content normal.        Judgment: Judgment normal.    Diabetic foot exam was performed with the following findings:   No deformities, ulcerations, or other skin breakdown Normal sensation of 10g monofilament Intact posterior tibialis and dorsalis pedis pulses      Labs reviewed: Basic Metabolic Panel: Recent Labs    08/06/19 0837 04/08/20 0826  NA 143 142  K 3.9 4.0  CL 108 108  CO2 28 26  GLUCOSE 116* 101*  BUN 14 12  CREATININE 1.06* 1.01*  CALCIUM 9.1 9.5   Liver Function Tests: Recent Labs    04/08/20 0826  AST  23  ALT 12  BILITOT 0.6  PROT 6.5   No results for input(s): LIPASE, AMYLASE in the last 8760 hours. No results for input(s): AMMONIA in the last 8760 hours. CBC: Recent Labs    04/08/20 0826  WBC 7.3  NEUTROABS 3,811  HGB 13.1  HCT 38.7  MCV 93.3  PLT 193   Lipid Panel: Recent Labs    08/06/19 0837 12/08/19 0832 04/08/20 0826  CHOL 213* 209* 200*  HDL 58 62 57  LDLCALC 140* 132* 127*  TRIG 60 60 67  CHOLHDL 3.7 3.4 3.5   Lab Results  Component Value Date   HGBA1C 6.0 (H) 04/08/2020     Assessment/Plan 1. Annual physical exam -performed today and updated foot exam, eye exam, tdap and hep c screen ordered, ACP to be faxed to [place] - Hemoglobin A1c; Future - Lipid panel; Future - Basic metabolic panel; Future  2. Type 2 diabetes mellitus with stage 1 chronic kidney disease, without long-term current use of insulin (HCC) -not on meds -last sugar avg in prediabetic range Lab Results  Component Value Date   HGBA1C 6.0 (H) 04/08/2020  -foot exam done today -had eye exam last month w/o retinopathy with Dr. Gershon Crane - Hemoglobin A1c; Future  3. Essential hypertension -bp at goal, cont arb/hctz and monitor -avoid regular ibuprofen use  - Basic metabolic panel; Future  4. Hyperlipidemia, unspecified hyperlipidemia type -cont to work on diet and exercise - Lipid panel; Future - Basic metabolic panel; Future  5. BMI 40.0-44.9, adult ([place]) -counseled on weight loss  6. Morbid obesity ([place]) -diet and exercise counseling provided, is down a few lbs since resuming treadmill routine  at gym during down hrs and mowing her yard -trying to do salads, veggies, chicken and not eating late at night  7. Need for Tdap vaccination - discussed that she may get billed for tdap here, but she had such struggles getting at pharmacy that she agrees to get it here  - Tdap vaccine greater than or equal to 7yo IM  8. Encounter for hepatitis C screening test for low risk patient -  Hepatitis C antibody; Future  9. Trigger little finger of right hand -return to Dr. Voncille Lo location and number given   Labs/tests ordered:  * No order type specified * Next appt:  07/02/2020   Latera Mclin L. Tocarra Gassen, D.O. [facility] [address], [place] [address] Cell Phone (Mon-Fri 8am-5pm):  [phone] On Call:  [phone] & follow prompts after 5pm & weekends Office Phone:  [phone] Office Fax:  [phone]

## 2020-06-25 ENCOUNTER — Encounter: Payer: Medicare Other | Admitting: Family

## 2020-06-28 ENCOUNTER — Encounter: Payer: Medicare Other | Admitting: Family

## 2020-07-02 ENCOUNTER — Ambulatory Visit (INDEPENDENT_AMBULATORY_CARE_PROVIDER_SITE_OTHER): Payer: Medicare PPO | Admitting: Family

## 2020-07-02 ENCOUNTER — Encounter: Payer: Self-pay | Admitting: Family

## 2020-07-02 ENCOUNTER — Other Ambulatory Visit: Payer: Self-pay

## 2020-07-02 DIAGNOSIS — Z0000 Encounter for general adult medical examination without abnormal findings: Secondary | ICD-10-CM | POA: Diagnosis not present

## 2020-07-02 NOTE — Progress Notes (Signed)
    This service is provided via telemedicine  No vital signs collected/recorded due to the encounter was a telemedicine visit.   Location of patient (ex: home, work): Home.  Patient consents to a telephone visit: Yes.  Location of the provider (ex: office, home):  [facility].  Name of any referring provider: N/A  Names of all persons participating in the telemedicine service and their role in the encounter:  Patient, Danielle Pierce, RMA, Ngetich, Dinah, NP.    Time spent on call: 8 minutes spent on the phone with Medical Assistant.   

## 2020-07-02 NOTE — Patient Instructions (Signed)
Ms. Danielle Pierce , Thank you for taking time to come for your Medicare Wellness Visit. I appreciate your ongoing commitment to your health goals. Please review the following plan we discussed and let me know if I can assist you in the future.   Screening recommendations/referrals: Colonoscopy: N/A  Mammogram: Up to date  Bone Density : Up to date  Recommended yearly ophthalmology/optometry visit for glaucoma screening and checkup Recommended yearly dental visit for hygiene and checkup  Vaccinations: Influenza vaccine: Due  Pneumococcal vaccine : Up to date  Tdap vaccine : Up to date  Shingles vaccine : Please get your shingles vaccine at your pharmacy    Advanced directives: yes   Conditions/risks identified: Advance age female> 13 yrs,Hypertension,Obesity BMI> 30   Next appointment: 1 year    Preventive Care 46 Years and Older, Female Preventive care refers to lifestyle choices and visits with your health care provider that can promote health and wellness. What does preventive care include?  A yearly physical exam. This is also called an annual well check.  Dental exams once or twice a year.  Routine eye exams. Ask your health care provider how often you should have your eyes checked.  Personal lifestyle choices, including:  Daily care of your teeth and gums.  Regular physical activity.  Eating a healthy diet.  Avoiding tobacco and drug use.  Limiting alcohol use.  Practicing safe sex.  Taking low-dose aspirin every day.  Taking vitamin and mineral supplements as recommended by your health care provider. What happens during an annual well check? The services and screenings done by your health care provider during your annual well check will depend on your age, overall health, lifestyle risk factors, and family history of disease. Counseling  Your health care provider may ask you questions about your:  Alcohol use.  Tobacco use.  Drug use.  Emotional  well-being.  Home and relationship well-being.  Sexual activity.  Eating habits.  History of falls.  Memory and ability to understand (cognition).  Work and work Statistician.  Reproductive health. Screening  You may have the following tests or measurements:  Height, weight, and BMI.  Blood pressure.  Lipid and cholesterol levels. These may be checked every 5 years, or more frequently if you are over 57 years old.  Skin check.  Lung cancer screening. You may have this screening every year starting at age 69 if you have a 30-pack-year history of smoking and currently smoke or have quit within the past 15 years.  Fecal occult blood test (FOBT) of the stool. You may have this test every year starting at age 21.  Flexible sigmoidoscopy or colonoscopy. You may have a sigmoidoscopy every 5 years or a colonoscopy every 10 years starting at age 19.  Hepatitis C blood test.  Hepatitis B blood test.  Sexually transmitted disease (STD) testing.  Diabetes screening. This is done by checking your blood sugar (glucose) after you have not eaten for a while (fasting). You may have this done every 1-3 years.  Bone density scan. This is done to screen for osteoporosis. You may have this done starting at age 25.  Mammogram. This may be done every 1-2 years. Talk to your health care provider about how often you should have regular mammograms. Talk with your health care provider about your test results, treatment options, and if necessary, the need for more tests. Vaccines  Your health care provider may recommend certain vaccines, such as:  Influenza vaccine. This is recommended every year.  Tetanus, diphtheria, and acellular pertussis (Tdap, Td) vaccine. You may need a Td booster every 10 years.  Zoster vaccine. You may need this after age 48.  Pneumococcal 13-valent conjugate (PCV13) vaccine. One dose is recommended after age 105.  Pneumococcal polysaccharide (PPSV23) vaccine. One  dose is recommended after age 75. Talk to your health care provider about which screenings and vaccines you need and how often you need them. This information is not intended to replace advice given to you by your health care provider. Make sure you discuss any questions you have with your health care provider. Document Released: 10/15/2015 Document Revised: 06/07/2016 Document Reviewed: 07/20/2015 Elsevier Interactive Patient Education  2017 [place] Prevention in the Home Falls can cause injuries. They can happen to people of all ages. There are many things you can do to make your home safe and to help prevent falls. What can I do on the outside of my home?  Regularly fix the edges of walkways and driveways and fix any cracks.  Remove anything that might make you trip as you walk through a door, such as a raised step or threshold.  Trim any bushes or trees on the path to your home.  Use bright outdoor lighting.  Clear any walking paths of anything that might make someone trip, such as rocks or tools.  Regularly check to see if handrails are loose or broken. Make sure that both sides of any steps have handrails.  Any raised decks and porches should have guardrails on the edges.  Have any leaves, snow, or ice cleared regularly.  Use sand or salt on walking paths during winter.  Clean up any spills in your garage right away. This includes oil or grease spills. What can I do in the bathroom?  Use night lights.  Install grab bars by the toilet and in the tub and shower. Do not use towel bars as grab bars.  Use non-skid mats or decals in the tub or shower.  If you need to sit down in the shower, use a plastic, non-slip stool.  Keep the floor dry. Clean up any water that spills on the floor as soon as it happens.  Remove soap buildup in the tub or shower regularly.  Attach bath mats securely with double-sided non-slip rug tape.  Do not have throw rugs and other  things on the floor that can make you trip. What can I do in the bedroom?  Use night lights.  Make sure that you have a light by your bed that is easy to reach.  Do not use any sheets or blankets that are too big for your bed. They should not hang down onto the floor.  Have a firm chair that has side arms. You can use this for support while you get dressed.  Do not have throw rugs and other things on the floor that can make you trip. What can I do in the kitchen?  Clean up any spills right away.  Avoid walking on wet floors.  Keep items that you use a lot in easy-to-reach places.  If you need to reach something above you, use a strong step stool that has a grab bar.  Keep electrical cords out of the way.  Do not use floor polish or wax that makes floors slippery. If you must use wax, use non-skid floor wax.  Do not have throw rugs and other things on the floor that can make you trip. What can I do  with my stairs?  Do not leave any items on the stairs.  Make sure that there are handrails on both sides of the stairs and use them. Fix handrails that are broken or loose. Make sure that handrails are as long as the stairways.  Check any carpeting to make sure that it is firmly attached to the stairs. Fix any carpet that is loose or worn.  Avoid having throw rugs at the top or bottom of the stairs. If you do have throw rugs, attach them to the floor with carpet tape.  Make sure that you have a light switch at the top of the stairs and the bottom of the stairs. If you do not have them, ask someone to add them for you. What else can I do to help prevent falls?  Wear shoes that:  Do not have high heels.  Have rubber bottoms.  Are comfortable and fit you well.  Are closed at the toe. Do not wear sandals.  If you use a stepladder:  Make sure that it is fully opened. Do not climb a closed stepladder.  Make sure that both sides of the stepladder are locked into place.  Ask  someone to hold it for you, if possible.  Clearly mark and make sure that you can see:  Any grab bars or handrails.  First and last steps.  Where the edge of each step is.  Use tools that help you move around (mobility aids) if they are needed. These include:  Canes.  Walkers.  Scooters.  Crutches.  Turn on the lights when you go into a dark area. Replace any light bulbs as soon as they burn out.  Set up your furniture so you have a clear path. Avoid moving your furniture around.  If any of your floors are uneven, fix them.  If there are any pets around you, be aware of where they are.  Review your medicines with your doctor. Some medicines can make you feel dizzy. This can increase your chance of falling. Ask your doctor what other things that you can do to help prevent falls. This information is not intended to replace advice given to you by your health care provider. Make sure you discuss any questions you have with your health care provider. Document Released: 07/15/2009 Document Revised: 02/24/2016 Document Reviewed: 10/23/2014 Elsevier Interactive Patient Education  2017 [employer].

## 2020-07-02 NOTE — Progress Notes (Signed)
Subjective:   Danielle Pierce is a 79 y.o. female who presents for Medicare Annual (Subsequent) preventive examination.  Review of Systems     Cardiac Risk Factors include: advanced age (>36men, >24 women);hypertension;obesity (BMI >30kg/m2)     Objective:    There were no vitals filed for this visit. There is no height or weight on file to calculate BMI.  Advanced Directives 07/02/2020 04/19/2020 12/11/2019 08/11/2019 06/24/2019 06/13/2018 01/21/2018  Does Patient Have a Medical Advance Directive? Yes Yes Yes Yes Yes Yes No  Type of Paramedic of [place];Living will;Out of facility DNR (pink MOST or yellow form) - Healthcare Power of Attorney Living will;Healthcare Power of [place];Living will [place];Living will -  Does patient want to make changes to medical advance directive? No - Patient declined No - Patient declined No - Patient declined No - Patient declined No - Patient declined No - Patient declined -  Copy of [place] in Chart? No - copy requested - - Yes - validated most recent copy scanned in chart (See row information) No - copy requested No - copy requested -  Would patient like information on creating a medical advance directive? - - - - - - No - Patient declined    Current Medications (verified) Outpatient Encounter Medications as of 07/02/2020  Medication Sig  . aspirin 81 MG tablet Take 81 mg by mouth daily. Take 1 tablet once a day.  . Calcium Carbonate-Vitamin D (CALCIUM-VITAMIN D) 500-200 MG-UNIT per tablet Take 1 tablet by mouth daily. Take 1 tablet twice a day to help bones.   . Carboxymethylcell-Hypromellose (GENTEAL) 0.25-0.3 % GEL Apply 1 application to eye daily. Use 1 application to each eye as needed to alleviate irritation.  . Cholecalciferol (VITAMIN D-3 PO) Take 1 tablet by mouth daily.   . hydrocortisone (ANUSOL-HC) 25 MG suppository Place 1 suppository (25 mg  total) rectally daily as needed for hemorrhoids or anal itching.  . losartan-hydrochlorothiazide (HYZAAR) 50-12.5 MG tablet TAKE 1 TABLET ONCE DAILY.  . vitamin B-12 (CYANOCOBALAMIN) 1000 MCG tablet Take 1,000 mcg by mouth daily. Take 1 tablet once a day.  . vitamin C (ASCORBIC ACID) 500 MG tablet Take 500 mg by mouth daily. Take 1 tablet once a day.  . vitamin E 400 UNIT capsule Take 400 Units by mouth daily. Take 1 capsule once a day.  . [DISCONTINUED] ibuprofen (ADVIL) 800 MG tablet TAKE (1) TABLET FOUR TIMES DAILY AS NEEDED FOR PAIN.   No facility-administered encounter medications on file as of 07/02/2020.    Allergies (verified) Patient has no known allergies.   History: Past Medical History:  Diagnosis Date  . Arthritis    Ankle  . Asymptomatic varicose veins   . Benign neoplasm of colon   . Bilateral shoulder pain 07/02/14  . Cataract    Bilateral - just watching  . Cervicitis and endocervicitis 09/13/2011  . Disorder of bone and cartilage, unspecified   . Disorders of bursae and tendons in shoulder region, unspecified   . External hemorrhoids without mention of complication   . Fibrocystic breast   . Generalized osteoarthrosis, unspecified site   . GERD (gastroesophageal reflux disease)    diet controlled, No meds  . Hiatal hernia   . Hypertension   . Impacted cerumen 11/07/2011  . Obesity, unspecified   . Other diseases of nasal cavity and sinuses(478.19)   . Pain in joint, ankle and foot   .  Pain in joint, pelvic region and thigh   . Pain in joint, shoulder region   . Reflux esophagitis   . SVD (spontaneous vaginal delivery)    x 2  . Unspecified essential hypertension   . Unspecified vitamin D deficiency    Past Surgical History:  Procedure Laterality Date  . COLONOSCOPY  2006   Dr.Orr  . COLONOSCOPY  07/08/2013   Dr. Carlean Purl  . [place]   Hemorrhoids   . MOUTH SURGERY  2005   DR LUTINS - tooth ext and gum surgery  . SHOULDER  ARTHROSCOPY W/ ACROMIAL REPAIR Right 07/02/14   Dr. Durward Fortes  . UPPER GASTROINTESTINAL ENDOSCOPY     normal   Family History  Problem Relation Age of Onset  . Hypertension Mother   . Kidney disease Mother        Renal failure  . Cancer Brother        Lymphoma  . ADD / ADHD Son   . Seizures Son   . Hypertension Sister   . Dementia Sister   . Hypertension Sister   . Hypertension Sister   . Cancer - Other Sister   . Early death Brother        Some type of accident  . Colon cancer Father 71  . Stomach cancer Neg Hx   . Rectal cancer Neg Hx    Social History   Socioeconomic History  . Marital status: Married    Spouse name: Not on file  . Number of children: Not on file  . Years of education: Not on file  . Highest education level: Not on file  Occupational History  . Occupation: retired Landscape architect  Tobacco Use  . Smoking status: Never Smoker  . Smokeless tobacco: Never Used  Vaping Use  . Vaping Use: Never used  Substance and Sexual Activity  . Alcohol use: Yes    Comment: occasional mixed drink   . Drug use: No  . Sexual activity: Not Currently    Birth control/protection: Post-menopausal  Other Topics Concern  . Not on file  Social History Narrative   Married    Never smoked   Alcohlol none   Exercise treadmill 3 times a week    Social Determinants of Health   Financial Resource Strain:   . Difficulty of Paying Living Expenses: Not on file  Food Insecurity:   . Worried About Charity fundraiser in the Last Year: Not on file  . Ran Out of Food in the Last Year: Not on file  Transportation Needs:   . Lack of Transportation (Medical): Not on file  . Lack of Transportation (Non-Medical): Not on file  Physical Activity:   . Days of Exercise per Week: Not on file  . Minutes of Exercise per Session: Not on file  Stress:   . Feeling of Stress : Not on file  Social Connections:   . Frequency of Communication with Friends and Family: Not on file   . Frequency of Social Gatherings with Friends and Family: Not on file  . Attends Religious Services: Not on file  . Active Member of Clubs or Organizations: Not on file  . Attends Archivist Meetings: Not on file  . Marital Status: Not on file    Tobacco Counseling Counseling given: Not Answered   Clinical Intake:  Pre-visit preparation completed: No  Pain : No/denies pain     BMI - recorded: 43.96 Nutritional Status: BMI > 30  Obese Nutritional Risks: None Diabetes: No  How often do you need to have someone help you when you read instructions, pamphlets, or other written materials from your doctor or pharmacy?: 1 - Never What is the last grade level you completed in school?: Master Degree  Diabetic?No   Interpreter Needed?: No  Information entered by :: Kaiyan Luczak FNP-C   Activities of Daily Living In your present state of health, do you have any difficulty performing the following activities: 07/02/2020  Hearing? N  Vision? N  Difficulty concentrating or making decisions? N  Walking or climbing stairs? N  Dressing or bathing? N  Doing errands, shopping? N  Preparing Food and eating ? N  Using the Toilet? N  In the past six months, have you accidently leaked urine? N  Do you have problems with loss of bowel control? N  Managing your Medications? N  Managing your Finances? N  Housekeeping or managing your Housekeeping? N  Some recent data might be hidden    Patient Care Team: Gayland Curry, DO as PCP - General (Geriatric Medicine) Garald Balding, MD as Consulting Physician (Orthopedic Surgery) Rutherford Guys, MD as Consulting Physician (Ophthalmology) Druscilla Brownie, MD as Consulting Physician (Dermatology) Gatha Mayer, MD as Consulting Physician (Gastroenterology)  Indicate any recent Medical Services you may have received from other than [facility] providers in the past year (date may be approximate).     Assessment:   This is a  routine wellness examination for Raechal.  Hearing/Vision screen  Hearing Screening   125Hz  250Hz  500Hz  1000Hz  2000Hz  3000Hz  4000Hz  6000Hz  8000Hz   Right ear:           Left ear:           Comments: No Hearing Concerns.   Vision Screening Comments: No Vision Concerns. Patient last eye exam was 03/23/2020  Dietary issues and exercise activities discussed: Current Exercise Habits: Home exercise routine, Type of exercise: treadmill;Other - see comments (cuts grass using push mower), Time (Minutes): 30, Frequency (Times/Week): 3, Weekly Exercise (Minutes/Week): 90, Intensity: Moderate, Exercise limited by: None identified  Goals    . Weight (lb) < 200 lb (90.7 kg)     I would like to loss weight at least 50 lbs  Exercise and eat health.      Depression Screen PHQ 2/9 Scores 07/02/2020 04/19/2020 12/11/2019 06/24/2019 04/17/2019 11/14/2018 06/17/2018  PHQ - 2 Score 0 0 0 0 0 0 0    Fall Risk Fall Risk  07/02/2020 04/19/2020 12/11/2019 08/11/2019 06/24/2019  Falls in the past year? 0 0 0 0 0  Number falls in past yr: 0 0 0 0 0  Injury with Fall? 0 0 0 - 0  Risk for fall due to : - - - - -  Follow up - - - - -    Any stairs in or around the home? Yes  If so, are there any without handrails? Yes  Home free of loose throw rugs in walkways, pet beds, electrical cords, etc? No  Adequate lighting in your home to reduce risk of falls? Yes   ASSISTIVE DEVICES UTILIZED TO PREVENT FALLS:  Life alert? No  Use of a cane, walker or w/c? No  Grab bars in the bathroom? No  Shower chair or bench in shower? No  Elevated toilet seat or a handicapped toilet? Yes   TIMED UP AND GO:  Was the test performed? No .  Length of time to ambulate 10 feet: N/A  sec.  Gait steady and fast without use of assistive device  Cognitive Function: MMSE - Mini Mental State Exam 06/24/2019 06/13/2018 11/08/2016  Orientation to time 5 5 5   Orientation to Place 5 5 5   Registration 3 3 3   Attention/ Calculation 5 5 5     Recall 0 2 2  Language- name 2 objects 2 2 2   Language- repeat 1 1 1   Language- follow 3 step command 3 3 3   Language- read & follow direction 1 1 1   Write a sentence 1 1 1   Copy design 1 0 1  Total score 27 28 29      6CIT Screen 07/02/2020  What Year? 0 points  What month? 0 points  What time? 0 points  Count back from 20 0 points  Months in reverse 0 points  Repeat phrase 2 points  Total Score 2    Immunizations Immunization History  Administered Date(s) Administered  . Fluad Quad(high Dose 65+) 06/24/2019  . Influenza, High Dose Seasonal PF 09/17/2017, 06/17/2018  . PFIZER SARS-COV-2 Vaccination 11/08/2019, 11/29/2019  . PPD Test 10/02/1997  . Pneumococcal Conjugate-13 05/10/2017  . Pneumococcal Polysaccharide-23 06/13/2018  . Td 04/30/1998  . Tdap 04/19/2020    TDAP status: Up to date Flu Vaccine status: Up to date Pneumococcal vaccine status: Up to date Covid-19 vaccine status: Completed vaccines  Qualifies for Shingles Vaccine? Yes   Zostavax completed No   Shingrix Completed?: No.    Education has been provided regarding the importance of this vaccine. Patient has been advised to call insurance company to determine out of pocket expense if they have not yet received this vaccine. Advised may also receive vaccine at local pharmacy or Health Dept. Verbalized acceptance and understanding.  Screening Tests Health Maintenance  Topic Date Due  . Hepatitis C Screening  Never done  . HEMOGLOBIN A1C  10/09/2020  . OPHTHALMOLOGY EXAM  03/23/2021  . FOOT EXAM  04/19/2021  . TETANUS/TDAP  04/19/2030  . DEXA SCAN  Completed  . COVID-19 Vaccine  Completed  . PNA vac Low Risk Adult  Completed    Health Maintenance  Health Maintenance Due  Topic Date Due  . Hepatitis C Screening  Never done    Colorectal cancer screening: No longer required.  Mammogram status: Completed 04/08/2020. Repeat every year Bone Density status: Completed 08/04/2018 . Results reflect: Bone  density results: OSTEOPENIA. Repeat every 2 years.  Lung Cancer Screening: (Low Dose CT Chest recommended if Age 25-80 years, 30 pack-year currently smoking OR have quit w/in 15years.) does not qualify.   Lung Cancer Screening Referral:No   Additional Screening:  Hepatitis C Screening: does qualify; Completed yes   Vision Screening: Recommended annual ophthalmology exams for early detection of glaucoma and other disorders of the eye. Is the patient up to date with their annual eye exam?  Yes  Who is the provider or what is the name of the office in which the patient attends annual eye exams? Dr.Shapiro  If pt is not established with a provider, would they like to be referred to a provider to establish care? No .   Dental Screening: Recommended annual dental exams for proper oral hygiene  Community Resource Referral / Chronic Care Management: CRR required this visit?  No   CCM required this visit?  No      Plan:    - Hepatitis C  - Shigrix vaccine  - COVID-19 vaccine booster - advised to get her pharmacy   I have personally reviewed and noted  the following in the patient's chart:   . Medical and social history . Use of alcohol, tobacco or illicit drugs  . Current medications and supplements . Functional ability and status . Nutritional status . Physical activity . Advanced directives . List of other physicians . Hospitalizations, surgeries, and ER visits in previous 12 months . Vitals . Screenings to include cognitive, depression, and falls . Referrals and appointments  In addition, I have reviewed and discussed with patient certain preventive protocols, quality metrics, and best practice recommendations. A written personalized care plan for preventive services as well as general preventive health recommendations were provided to patient.    Sandrea Hughs, NP   07/02/2020   Nurse Notes: Advised to get her shingles vaccine at her pharmacy

## 2020-07-05 ENCOUNTER — Telehealth: Payer: Self-pay

## 2020-07-05 NOTE — Telephone Encounter (Signed)
Ms. Danielle Pierce, Danielle Pierce are scheduled for a virtual visit with your provider today.    Just as we do with appointments in the office, we must obtain your consent to participate.  Your consent will be active for this visit and any virtual visit you may have with one of our providers in the next 365 days.    If you have a MyChart account, I can also send a copy of this consent to you electronically.  All virtual visits are billed to your insurance company just like a traditional visit in the office.  As this is a virtual visit, video technology does not allow for your provider to perform a traditional examination.  This may limit your provider's ability to fully assess your condition.  If your provider identifies any concerns that need to be evaluated in person or the need to arrange testing such as labs, EKG, etc, we will make arrangements to do so.    Although advances in technology are sophisticated, we cannot ensure that it will always work on either your end or our end.  If the connection with a video visit is poor, we may have to switch to a telephone visit.  With either a video or telephone visit, we are not always able to ensure that we have a secure connection.   I need to obtain your verbal consent now.   Are you willing to proceed with your visit today?   Danielle Pierce has provided verbal consent on 07/02/2020 for a virtual visit (video or telephone).   Danielle Pierce, [place] 07/02/2020  9:30 AM

## 2020-07-27 ENCOUNTER — Ambulatory Visit (INDEPENDENT_AMBULATORY_CARE_PROVIDER_SITE_OTHER): Payer: Medicare PPO | Admitting: Orthopaedic Surgery

## 2020-07-27 ENCOUNTER — Encounter: Payer: Self-pay | Admitting: Orthopaedic Surgery

## 2020-07-27 ENCOUNTER — Other Ambulatory Visit: Payer: Self-pay

## 2020-07-27 DIAGNOSIS — M65311 Trigger thumb, right thumb: Secondary | ICD-10-CM | POA: Insufficient documentation

## 2020-07-27 MED ORDER — BUPIVACAINE HCL 0.25 % IJ SOLN
0.3300 mL | INTRAMUSCULAR | Status: AC | PRN
  Administered 2020-07-27: .33 mL

## 2020-07-27 MED ORDER — LIDOCAINE HCL 1 % IJ SOLN
0.5000 mL | INTRAMUSCULAR | Status: AC | PRN
  Administered 2020-07-27: .5 mL

## 2020-07-27 MED ORDER — METHYLPREDNISOLONE ACETATE 40 MG/ML IJ SUSP
20.0000 mg | INTRAMUSCULAR | Status: AC | PRN
  Administered 2020-07-27: 20 mg

## 2020-07-27 NOTE — Progress Notes (Signed)
Office Visit Note   Patient: Danielle Pierce           Date of Birth: [birth year]           MRN: [id number] Visit Date: 07/27/2020              Requested by: Gayland Curry, DO [place],  [place] [address] PCP: Gayland Curry, DO   Assessment & Plan: Visit Diagnoses:  1. Trigger thumb, right thumb     Plan:  #1: At this time the nodule the A1 pulley was injected by Dr. Durward Fortes tolerated the procedure well. #2: Discussed possibility of surgery if this fails and she is understanding of this.  Follow-Up Instructions: Return if symptoms worsen or fail to improve.   Orders:  No orders of the defined types were placed in this encounter.  No orders of the defined types were placed in this encounter.     Procedures: Hand/UE Inj: L thumb A1 for trigger finger on 07/27/2020 3:18 PM Indications: tendon swelling Details: 27 G needle, volar approach Medications: 0.5 mL lidocaine 1 %; 0.33 mL bupivacaine 0.25 %; 20 mg methylPREDNISolone acetate 40 MG/ML Outcome: tolerated well, no immediate complications Procedure, treatment alternatives, risks and benefits explained, specific risks discussed. Consent was given by the patient. Immediately prior to procedure a time out was called to verify the correct patient, procedure, equipment, support staff and site/side marked as required. Patient was prepped and draped in the usual sterile fashion.       Clinical Data: No additional findings.   Subjective: Chief Complaint  Patient presents with  . Right Hand - Pain   HPI Patient presents today for right thumb pain. She said that it has been catching and sticking when she bends her finger. This has been going on for a month, but more recently it started to hurt as well. She is right hand dominant. No known injury.    Review of Systems   Objective: Vital Signs: Ht 5\' 3"  (1.6 m)   Wt 248 lb (112.5 kg)   BMI 43.93 kg/m   Physical Exam Constitutional:       Appearance: Normal appearance. She is obese.  HENT:     Head: Normocephalic.  Eyes:     Extraocular Movements: Extraocular movements intact.  Cardiovascular:     Rate and Rhythm: Normal rate.  Neurological:     Mental Status: She is alert and oriented to person, place, and time.  Psychiatric:        Mood and Affect: Mood normal.        Behavior: Behavior normal.     Ortho Exam  Exam today reveals a nodule on the flexor tendon of the right thumb.  She does have a triggers with flexion and extension of the thumb.  Very tender over the nodule to palpation.    Specialty Comments:  No specialty comments available.  Imaging: No results found.   PMFS History: Current Outpatient Medications  Medication Sig Dispense Refill  . aspirin 81 MG tablet Take 81 mg by mouth daily. Take 1 tablet once a day.    . Calcium Carbonate-Vitamin D (CALCIUM-VITAMIN D) 500-200 MG-UNIT per tablet Take 1 tablet by mouth daily. Take 1 tablet twice a day to help bones.     . Carboxymethylcell-Hypromellose (GENTEAL) 0.25-0.3 % GEL Apply 1 application to eye daily. Use 1 application to each eye as needed to alleviate irritation.    . Cholecalciferol (VITAMIN D-3 PO) Take  1 tablet by mouth daily.     . hydrocortisone (ANUSOL-HC) 25 MG suppository Place 1 suppository (25 mg total) rectally daily as needed for hemorrhoids or anal itching. 30 suppository 0  . losartan-hydrochlorothiazide (HYZAAR) 50-12.5 MG tablet TAKE 1 TABLET ONCE DAILY. 90 tablet 0  . vitamin B-12 (CYANOCOBALAMIN) 1000 MCG tablet Take 1,000 mcg by mouth daily. Take 1 tablet once a day.    . vitamin C (ASCORBIC ACID) 500 MG tablet Take 500 mg by mouth daily. Take 1 tablet once a day.    . vitamin E 400 UNIT capsule Take 400 Units by mouth daily. Take 1 capsule once a day.     No current facility-administered medications for this visit.    Patient Active Problem List   Diagnosis Date Noted  . Trigger thumb, right thumb 07/27/2020  . Body  mass index (BMI) of 40.1-44.9 in adult ([facility]) 11/14/2018  . Localized osteoarthritis of left knee 05/10/2017  . Female perineal bleeding 11/08/2016  . HLD (hyperlipidemia) 01/19/2016  . Fasciitis 12/15/2015  . Corn 05/18/2015  . Excessive cerumen in both ear canals 05/18/2015  . Age spots 04/08/2014  . Blepharochalasis of right eye 04/08/2014  . Lumbago 04/08/2014  . Personal history of colonic adenomas 07/08/2013  . Family history of colon cancer- father in 70s 07/08/2013  . Internal and external bleeding hemorrhoids 07/08/2013  . DM (diabetes mellitus), type 2 with renal complications ([place]) 71/24/5809  . Essential hypertension 02/20/2013  . Obesity   . Vitamin D deficiency    Past Medical History:  Diagnosis Date  . Arthritis    Ankle  . Asymptomatic varicose veins   . Benign neoplasm of colon   . Bilateral shoulder pain 07/02/14  . Cataract    Bilateral - just watching  . Cervicitis and endocervicitis 09/13/2011  . Disorder of bone and cartilage, unspecified   . Disorders of bursae and tendons in shoulder region, unspecified   . External hemorrhoids without mention of complication   . Fibrocystic breast   . Generalized osteoarthrosis, unspecified site   . GERD (gastroesophageal reflux disease)    diet controlled, No meds  . Hiatal hernia   . Hypertension   . Impacted cerumen 11/07/2011  . Obesity, unspecified   . Other diseases of nasal cavity and sinuses(478.19)   . Pain in joint, ankle and foot   . Pain in joint, pelvic region and thigh   . Pain in joint, shoulder region   . Reflux esophagitis   . SVD (spontaneous vaginal delivery)    x 2  . Unspecified essential hypertension   . Unspecified vitamin D deficiency     Family History  Problem Relation Age of Onset  . Hypertension Mother   . Kidney disease Mother        Renal failure  . Cancer Brother        Lymphoma  . ADD / ADHD Son   . Seizures Son   . Hypertension Sister   . Dementia Sister   .  Hypertension Sister   . Hypertension Sister   . Cancer - Other Sister   . Early death Brother        Some type of accident  . Colon cancer Father 4  . Stomach cancer Neg Hx   . Rectal cancer Neg Hx     Past Surgical History:  Procedure Laterality Date  . COLONOSCOPY  2006   Dr.Orr  . COLONOSCOPY  07/08/2013   Dr. Carlean Purl  . FLEXIBLE SIGMOIDOSCOPY  1990   Hemorrhoids   . MOUTH SURGERY  2005   DR LUTINS - tooth ext and gum surgery  . SHOULDER ARTHROSCOPY W/ ACROMIAL REPAIR Right 07/02/14   Dr. Durward Fortes  . UPPER GASTROINTESTINAL ENDOSCOPY     normal   Social History   Occupational History  . Occupation: retired Landscape architect  Tobacco Use  . Smoking status: Never Smoker  . Smokeless tobacco: Never Used  Vaping Use  . Vaping Use: Never used  Substance and Sexual Activity  . Alcohol use: Yes    Comment: occasional mixed drink   . Drug use: No  . Sexual activity: Not Currently    Birth control/protection: Post-menopausal

## 2020-08-04 ENCOUNTER — Other Ambulatory Visit: Payer: Medicare PPO

## 2020-08-04 DIAGNOSIS — Z20822 Contact with and (suspected) exposure to covid-19: Secondary | ICD-10-CM

## 2020-08-05 LAB — NOVEL CORONAVIRUS, NAA: SARS-CoV-2, NAA: NOT DETECTED

## 2020-08-05 LAB — SARS-COV-2, NAA 2 DAY TAT

## 2020-08-10 ENCOUNTER — Other Ambulatory Visit: Payer: Medicare PPO

## 2020-08-10 DIAGNOSIS — Z20822 Contact with and (suspected) exposure to covid-19: Secondary | ICD-10-CM | POA: Diagnosis not present

## 2020-08-11 LAB — NOVEL CORONAVIRUS, NAA: SARS-CoV-2, NAA: NOT DETECTED

## 2020-08-11 LAB — SARS-COV-2, NAA 2 DAY TAT

## 2020-08-16 ENCOUNTER — Other Ambulatory Visit: Payer: Medicare PPO

## 2020-08-17 ENCOUNTER — Other Ambulatory Visit: Payer: Self-pay

## 2020-08-17 ENCOUNTER — Other Ambulatory Visit: Payer: Medicare PPO

## 2020-08-17 DIAGNOSIS — N181 Chronic kidney disease, stage 1: Secondary | ICD-10-CM | POA: Diagnosis not present

## 2020-08-17 DIAGNOSIS — E1122 Type 2 diabetes mellitus with diabetic chronic kidney disease: Secondary | ICD-10-CM | POA: Diagnosis not present

## 2020-08-17 DIAGNOSIS — E785 Hyperlipidemia, unspecified: Secondary | ICD-10-CM | POA: Diagnosis not present

## 2020-08-17 DIAGNOSIS — Z1159 Encounter for screening for other viral diseases: Secondary | ICD-10-CM | POA: Diagnosis not present

## 2020-08-17 DIAGNOSIS — Z20822 Contact with and (suspected) exposure to covid-19: Secondary | ICD-10-CM

## 2020-08-17 DIAGNOSIS — I10 Essential (primary) hypertension: Secondary | ICD-10-CM | POA: Diagnosis not present

## 2020-08-17 DIAGNOSIS — Z0000 Encounter for general adult medical examination without abnormal findings: Secondary | ICD-10-CM | POA: Diagnosis not present

## 2020-08-18 LAB — LIPID PANEL
Cholesterol: 190 mg/dL (ref <200)
HDL: 60 mg/dL (ref >=50)
LDL Cholesterol (Calc): 115 mg/dL (calc) — ABNORMAL HIGH
Non-HDL Cholesterol (Calc): 130 mg/dL (calc) — ABNORMAL HIGH (ref <130)
Total CHOL/HDL Ratio: 3.2 (calc) (ref <5.0)
Triglycerides: 63 mg/dL (ref <150)

## 2020-08-18 LAB — BASIC METABOLIC PANEL
BUN/Creatinine Ratio: 14 (calc) (ref 6–22)
BUN: 15 mg/dL (ref 7–25)
CO2: 23 mmol/L (ref 20–32)
Calcium: 9.2 mg/dL (ref 8.6–10.4)
Chloride: 108 mmol/L (ref 98–110)
Creat: 1.09 mg/dL — ABNORMAL HIGH (ref 0.60–0.93)
Glucose, Bld: 102 mg/dL — ABNORMAL HIGH (ref 65–99)
Potassium: 3.7 mmol/L (ref 3.5–5.3)
Sodium: 143 mmol/L (ref 135–146)

## 2020-08-18 LAB — HEPATITIS C ANTIBODY
Hepatitis C Ab: NONREACTIVE
SIGNAL TO CUT-OFF: 0.01 (ref <1.00)

## 2020-08-18 LAB — HEMOGLOBIN A1C
Hgb A1c MFr Bld: 6.2 % of total Hgb — ABNORMAL HIGH (ref <5.7)
Mean Plasma Glucose: 131 (calc)
eAG (mmol/L): 7.3 (calc)

## 2020-08-18 NOTE — Progress Notes (Signed)
Hepatitis C screen is negative. Kidneys are stable Electrolytes are normal. Cholesterol has improved since last time.  Still not at goal.

## 2020-08-19 ENCOUNTER — Encounter: Payer: Self-pay | Admitting: Internal Medicine

## 2020-08-19 ENCOUNTER — Other Ambulatory Visit: Payer: Self-pay

## 2020-08-19 ENCOUNTER — Ambulatory Visit (INDEPENDENT_AMBULATORY_CARE_PROVIDER_SITE_OTHER): Payer: Medicare PPO | Admitting: Internal Medicine

## 2020-08-19 VITALS — BP 122/58 | HR 60 | Temp 97.0°F | Ht 63.0 in | Wt 246.0 lb

## 2020-08-19 DIAGNOSIS — M65311 Trigger thumb, right thumb: Secondary | ICD-10-CM | POA: Diagnosis not present

## 2020-08-19 DIAGNOSIS — E785 Hyperlipidemia, unspecified: Secondary | ICD-10-CM | POA: Diagnosis not present

## 2020-08-19 DIAGNOSIS — E1122 Type 2 diabetes mellitus with diabetic chronic kidney disease: Secondary | ICD-10-CM

## 2020-08-19 DIAGNOSIS — M170 Bilateral primary osteoarthritis of knee: Secondary | ICD-10-CM | POA: Diagnosis not present

## 2020-08-19 DIAGNOSIS — I10 Essential (primary) hypertension: Secondary | ICD-10-CM | POA: Diagnosis not present

## 2020-08-19 DIAGNOSIS — N181 Chronic kidney disease, stage 1: Secondary | ICD-10-CM

## 2020-08-19 DIAGNOSIS — Z23 Encounter for immunization: Secondary | ICD-10-CM

## 2020-08-19 LAB — SARS-COV-2, NAA 2 DAY TAT

## 2020-08-19 LAB — NOVEL CORONAVIRUS, NAA: SARS-CoV-2, NAA: NOT DETECTED

## 2020-08-19 NOTE — Progress Notes (Signed)
Location:  [facility] clinic Provider:  Emrick Hensch L. Mariea Clonts, D.O., C.M.D.  Goals of Care:  Advanced Directives 08/19/2020  Does Patient Have a Medical Advance Directive? Yes  Type of Paramedic of [place];Out of facility DNR (pink MOST or yellow form)  Does patient want to make changes to medical advance directive? No - Patient declined  Copy of [place] in Chart? No - copy requested  Would patient like information on creating a medical advance directive? -   Chief Complaint  Patient presents with  . Medical Management of Chronic Issues    4 month follow up     HPI: Patient is a 79 y.o. female seen today for medical management of chronic diseases.    She had her wellness with Dinah on 10/1.  She saw Dr. Durward Fortes for her right trigger thumb 10/26.    It's not completely gone (the sticking) after the shot but it's not sore anymore.  Reviewed labs:  Bad cholesterol improved but remains above goal.  hba1c up to 6.2 from 6.   She knows her diet is not good.  She's been doing her walking on the treadmill and cutting the grass.   She's drinking too much sweet tea.  Also lemonade.  Her son is a Designer, television/film set.  They did do a diet for a week w/o sugar before homecoming.  Then she fell off that.    She got her flu shot today.    She's had her booster--thinks it was at [facility].  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  Last one was Tuesday negative.   She's assigned to the [employer].    Now the right knee is sore.  It's coming and going.   She has taken a little bit of ibuprofen for it, but not daily.  She's down 2 lbs but hasn't "gotten rid of my covid" weight.    Past Medical History:  Diagnosis Date  . Arthritis    Ankle  . Asymptomatic varicose veins   . Benign neoplasm of colon   . Bilateral shoulder pain 07/02/14  . Cataract    Bilateral - just watching  . Cervicitis and endocervicitis 09/13/2011  . Disorder  of bone and cartilage, unspecified   . Disorders of bursae and tendons in shoulder region, unspecified   . External hemorrhoids without mention of complication   . Fibrocystic breast   . Generalized osteoarthrosis, unspecified site   . GERD (gastroesophageal reflux disease)    diet controlled, No meds  . Hiatal hernia   . Hypertension   . Impacted cerumen 11/07/2011  . Obesity, unspecified   . Other diseases of nasal cavity and sinuses(478.19)   . Pain in joint, ankle and foot   . Pain in joint, pelvic region and thigh   . Pain in joint, shoulder region   . Reflux esophagitis   . SVD (spontaneous vaginal delivery)    x 2  . Unspecified essential hypertension   . Unspecified vitamin D deficiency     Past Surgical History:  Procedure Laterality Date  . COLONOSCOPY  2006   Dr.Orr  . COLONOSCOPY  07/08/2013   Dr. Carlean Purl  . [place]   Hemorrhoids   . MOUTH SURGERY  2005   DR LUTINS - tooth ext and gum surgery  . SHOULDER ARTHROSCOPY W/ ACROMIAL REPAIR Right 07/02/14   Dr. Durward Fortes  . UPPER GASTROINTESTINAL ENDOSCOPY     normal  No Known Allergies  Outpatient Encounter Medications as of 08/19/2020  Medication Sig  . aspirin 81 MG tablet Take 81 mg by mouth daily. Take 1 tablet once a day.  . Calcium Carbonate-Vitamin D (CALCIUM-VITAMIN D) 500-200 MG-UNIT per tablet Take 1 tablet by mouth daily. Take 1 tablet twice a day to help bones.   . Carboxymethylcell-Hypromellose (GENTEAL) 0.25-0.3 % GEL Apply 1 application to eye daily. Use 1 application to each eye as needed to alleviate irritation.  . Cholecalciferol (VITAMIN D-3 PO) Take 1 tablet by mouth daily.   . hydrocortisone (ANUSOL-HC) 25 MG suppository Place 1 suppository (25 mg total) rectally daily as needed for hemorrhoids or anal itching.  . losartan-hydrochlorothiazide (HYZAAR) 50-12.5 MG tablet TAKE 1 TABLET ONCE DAILY.  . vitamin B-12 (CYANOCOBALAMIN) 1000 MCG tablet Take 1,000 mcg by mouth  daily. Take 1 tablet once a day.  . vitamin C (ASCORBIC ACID) 500 MG tablet Take 500 mg by mouth daily. Take 1 tablet once a day.  . vitamin E 400 UNIT capsule Take 400 Units by mouth daily. Take 1 capsule once a day.   No facility-administered encounter medications on file as of 08/19/2020.    Review of Systems:  Review of Systems  Constitutional: Negative for chills, fever and malaise/fatigue.  HENT: Negative for congestion and sore throat.   Eyes: Negative for blurred vision.  Respiratory: Negative for cough and shortness of breath.   Cardiovascular: Negative for chest pain, palpitations and leg swelling.  Gastrointestinal: Negative for abdominal pain, blood in stool, constipation, diarrhea and melena.  Genitourinary: Negative for dysuria.  Musculoskeletal: Positive for joint pain. Negative for falls.  Neurological: Negative for dizziness and loss of consciousness.  Endo/Heme/Allergies: Does not bruise/bleed easily.  Psychiatric/Behavioral: Negative for depression and memory loss. The patient is not nervous/anxious and does not have insomnia.     Health Maintenance  Topic Date Due  . HEMOGLOBIN A1C  02/14/2021  . OPHTHALMOLOGY EXAM  03/23/2021  . FOOT EXAM  04/19/2021  . TETANUS/TDAP  04/19/2030  . DEXA SCAN  Completed  . COVID-19 Vaccine  Completed  . Hepatitis C Screening  Completed  . PNA vac Low Risk Adult  Completed    Physical Exam: Vitals:   08/19/20 0852  Weight: 246 lb (111.6 kg)  Height: 5\' 3"  (1.6 m)   Body mass index is 43.58 kg/m. Physical Exam Vitals reviewed.  Constitutional:      General: She is not in acute distress.    Appearance: Normal appearance. She is obese. She is not toxic-appearing.  HENT:     Head: Normocephalic and atraumatic.  Eyes:     Extraocular Movements: Extraocular movements intact.     Pupils: Pupils are equal, round, and reactive to light.  Cardiovascular:     Rate and Rhythm: Normal rate and regular rhythm.     Pulses:  Normal pulses.     Heart sounds: Normal heart sounds.  Pulmonary:     Effort: Pulmonary effort is normal.     Breath sounds: Normal breath sounds. No wheezing, rhonchi or rales.  Abdominal:     General: Bowel sounds are normal.  Musculoskeletal:        General: Normal range of motion.     Right lower leg: No edema.     Left lower leg: No edema.     Comments: Mild tenderness right knee and some palpable bone spur  Skin:    General: Skin is warm and dry.  Neurological:  General: No focal deficit present.     Mental Status: She is alert and oriented to person, place, and time.     Motor: No weakness.     Gait: Gait normal.  Psychiatric:        Mood and Affect: Mood normal.        Behavior: Behavior normal.     Labs reviewed: Basic Metabolic Panel: Recent Labs    04/08/20 0826 08/17/20 0905  NA 142 143  K 4.0 3.7  CL 108 108  CO2 26 23  GLUCOSE 101* 102*  BUN 12 15  CREATININE 1.01* 1.09*  CALCIUM 9.5 9.2   Liver Function Tests: Recent Labs    04/08/20 0826  AST 23  ALT 12  BILITOT 0.6  PROT 6.5   No results for input(s): LIPASE, AMYLASE in the last 8760 hours. No results for input(s): AMMONIA in the last 8760 hours. CBC: Recent Labs    04/08/20 0826  WBC 7.3  NEUTROABS 3,811  HGB 13.1  HCT 38.7  MCV 93.3  PLT 193   Lipid Panel: Recent Labs    12/08/19 0832 04/08/20 0826 08/17/20 0905  CHOL 209* 200* 190  HDL 62 57 60  LDLCALC 132* 127* 115*  TRIG 60 67 63  CHOLHDL 3.4 3.5 3.2   Lab Results  Component Value Date   HGBA1C 6.2 (H) 08/17/2020    Assessment/Plan 1. Type 2 diabetes mellitus with stage 1 chronic kidney disease, without long-term current use of insulin (HCC) - well controlled in prediabetic range - encouraged improved diet - continued walking on treadmill - COMPLETE METABOLIC PANEL WITH GFR; Future - Hemoglobin A1c; Future  2. Essential hypertension - bp at goal with current regimen, cont same and monitor - CBC with  Differential/Platelet; Future  3. Hyperlipidemia, unspecified hyperlipidemia type -LDL still not at goal, but did improved--she would like to continue to work on her diet and we'll reassess in March - CBC with Differential/Platelet; Future - COMPLETE METABOLIC PANEL WITH GFR; Future - Lipid panel; Future  4. Trigger thumb, right thumb -improved after injection  5. Bilateral primary osteoarthritis of knee -now right knee affected also -cont walking on treadmill -use voltaren gel otc if needed for right knee pain now -weight loss would help her for sure  6. Need for influenza vaccination - Flu Vaccine QUAD High Dose(Fluad)  Labs/tests ordered:   Lab Orders     CBC with Differential/Platelet     COMPLETE METABOLIC PANEL WITH GFR     Lipid panel     Hemoglobin A1c  Next appt:  4 mos med mgt, fasting labs before  Eriko Economos L. Marjon Doxtater, D.O. [facility] [address], [place] [address] Cell Phone (Mon-Fri 8am-5pm):  [phone] On Call:  [phone] & follow prompts after 5pm & weekends Office Phone:  [phone] Office Fax:  [phone]

## 2020-08-19 NOTE — Patient Instructions (Signed)
Get some voltaren gel for your right knee to try. Keep it up with walking flat on the treadmill. Work on cutting down on the sweet tea and lemonade--drink more water instead.

## 2020-08-31 ENCOUNTER — Other Ambulatory Visit: Payer: Medicare PPO

## 2020-08-31 DIAGNOSIS — Z20822 Contact with and (suspected) exposure to covid-19: Secondary | ICD-10-CM | POA: Diagnosis not present

## 2020-09-02 LAB — SARS-COV-2, NAA 2 DAY TAT

## 2020-09-02 LAB — NOVEL CORONAVIRUS, NAA: SARS-CoV-2, NAA: NOT DETECTED

## 2020-09-08 ENCOUNTER — Ambulatory Visit (INDEPENDENT_AMBULATORY_CARE_PROVIDER_SITE_OTHER): Payer: Medicare PPO | Admitting: Orthopaedic Surgery

## 2020-09-08 ENCOUNTER — Other Ambulatory Visit: Payer: Self-pay

## 2020-09-08 ENCOUNTER — Encounter: Payer: Self-pay | Admitting: Orthopaedic Surgery

## 2020-09-08 ENCOUNTER — Ambulatory Visit: Payer: Self-pay

## 2020-09-08 VITALS — Ht 63.0 in | Wt 246.0 lb

## 2020-09-08 DIAGNOSIS — M170 Bilateral primary osteoarthritis of knee: Secondary | ICD-10-CM | POA: Diagnosis not present

## 2020-09-08 DIAGNOSIS — M25561 Pain in right knee: Secondary | ICD-10-CM | POA: Diagnosis not present

## 2020-09-08 NOTE — Progress Notes (Signed)
Office Visit Note   Patient: Danielle Pierce           Date of Birth: 1940/11/11           MRN: [id number] Visit Date: 09/08/2020              Requested by: Gayland Curry, DO [place],  [place] [address] PCP: Gayland Curry, DO   Assessment & Plan: Visit Diagnoses:  1. Acute pain of right knee   2. Bilateral primary osteoarthritis of knee     Plan: Mrs. Danielle Pierce experienced onset of right knee pain after cutting the grass recently.  She has had some medial joint pain.  Films are consistent with osteoarthritis.  I do not think she has a meniscal tear but rather just an exacerbation of the arthritis.  After much discussion she did not want a cortisone injection but will continue with a pullover knee support, Voltaren gel and heat.  We have also discussed use of Tylenol.  Be happy to see her back at any time for consideration of cortisone  Follow-Up Instructions: Return if symptoms worsen or fail to improve.   Orders:  Orders Placed This Encounter  Procedures  . XR KNEE 3 VIEW RIGHT   No orders of the defined types were placed in this encounter.     Procedures: No procedures performed   Clinical Data: No additional findings.   Subjective: Chief Complaint  Patient presents with  . Right Knee - Pain  Patient presents today for right knee pain. She said that her knee began to hurt one week ago after cutting her lawn. No known injury. Her pain is located posteriorly. Swollen. No grinding or giving way. She has had some difficulty sleeping. She states that initially her knee hurt all the time, but it has improved some with Ibuprofen.   HPI  Review of Systems   Objective: Vital Signs: Ht 5\' 3"  (1.6 m)   Wt 246 lb (111.6 kg)   BMI 43.58 kg/m   Physical Exam Constitutional:      Appearance: She is well-developed.  Eyes:     Pupils: Pupils are equal, round, and reactive to light.  Pulmonary:     Effort: Pulmonary effort is normal.  Skin:     General: Skin is warm and dry.  Neurological:     Mental Status: She is alert and oriented to person, place, and time.  Psychiatric:        Behavior: Behavior normal.     Ortho Exam awake alert and oriented x3.  Comfortable sitting.  BMI of 44.  Right knee is large.  I do not think there is an effusion.  There was mild diffuse medial joint pain without popping or clicking.  Some patella crepitation is as expected based on her x-rays with the patellofemoral arthritis.  No pain laterally.  Full extension and about 95 degrees of flexion without instability and walks without a limp  Specialty Comments:  No specialty comments available.  Imaging: XR KNEE 3 VIEW RIGHT  Result Date: 09/08/2020 Films of the right knee were obtained in 3 projections standing.  There are significant degenerative changes of the patellofemoral joint associated with lateral tilting and near bone-on-bone of the lateral patellofemoral articulation.  Decreased medial joint space with possible faint calcification within the menisci consistent with CPPD.  Mild subchondral sclerosis on the medial joint line.  Films are consistent with moderate to advanced osteoarthritis without acute change  PMFS History: Patient Active Problem List   Diagnosis Date Noted  . Bilateral primary osteoarthritis of knee 09/08/2020  . Trigger thumb, right thumb 07/27/2020  . Body mass index (BMI) of 40.1-44.9 in adult ([place]) 11/14/2018  . Localized osteoarthritis of left knee 05/10/2017  . Female perineal bleeding 11/08/2016  . HLD (hyperlipidemia) 01/19/2016  . Fasciitis 12/15/2015  . Corn 05/18/2015  . Excessive cerumen in both ear canals 05/18/2015  . Age spots 04/08/2014  . Blepharochalasis of right eye 04/08/2014  . Lumbago 04/08/2014  . Personal history of colonic adenomas 07/08/2013  . Family history of colon cancer- father in 24s 07/08/2013  . Internal and external bleeding hemorrhoids 07/08/2013  . DM (diabetes mellitus), type  2 with renal complications ([place]) 33/29/5188  . Essential hypertension 02/20/2013  . Obesity   . Vitamin D deficiency    Past Medical History:  Diagnosis Date  . Arthritis    Ankle  . Asymptomatic varicose veins   . Benign neoplasm of colon   . Bilateral shoulder pain 07/02/14  . Cataract    Bilateral - just watching  . Cervicitis and endocervicitis 09/13/2011  . Disorder of bone and cartilage, unspecified   . Disorders of bursae and tendons in shoulder region, unspecified   . External hemorrhoids without mention of complication   . Fibrocystic breast   . Generalized osteoarthrosis, unspecified site   . GERD (gastroesophageal reflux disease)    diet controlled, No meds  . Hiatal hernia   . Hypertension   . Impacted cerumen 11/07/2011  . Obesity, unspecified   . Other diseases of nasal cavity and sinuses(478.19)   . Pain in joint, ankle and foot   . Pain in joint, pelvic region and thigh   . Pain in joint, shoulder region   . Reflux esophagitis   . SVD (spontaneous vaginal delivery)    x 2  . Unspecified essential hypertension   . Unspecified vitamin D deficiency     Family History  Problem Relation Age of Onset  . Hypertension Mother   . Kidney disease Mother        Renal failure  . Cancer Brother        Lymphoma  . ADD / ADHD Son   . Seizures Son   . Hypertension Sister   . Dementia Sister   . Hypertension Sister   . Hypertension Sister   . Cancer - Other Sister   . Early death Brother        Some type of accident  . Colon cancer Father 64  . Stomach cancer Neg Hx   . Rectal cancer Neg Hx     Past Surgical History:  Procedure Laterality Date  . COLONOSCOPY  2006   Dr.Orr  . COLONOSCOPY  07/08/2013   Dr. Carlean Purl  . [place]   Hemorrhoids   . MOUTH SURGERY  2005   DR LUTINS - tooth ext and gum surgery  . SHOULDER ARTHROSCOPY W/ ACROMIAL REPAIR Right 07/02/14   Dr. Durward Fortes  . UPPER GASTROINTESTINAL ENDOSCOPY     normal   Social  History   Occupational History  . Occupation: retired Landscape architect  Tobacco Use  . Smoking status: Never Smoker  . Smokeless tobacco: Never Used  Vaping Use  . Vaping Use: Never used  Substance and Sexual Activity  . Alcohol use: Yes    Comment: occasional mixed drink   . Drug use: No  . Sexual activity: Not Currently  Birth control/protection: Post-menopausal

## 2020-09-14 ENCOUNTER — Other Ambulatory Visit: Payer: Medicare PPO

## 2020-09-14 DIAGNOSIS — Z20822 Contact with and (suspected) exposure to covid-19: Secondary | ICD-10-CM

## 2020-09-15 LAB — SARS-COV-2, NAA 2 DAY TAT

## 2020-09-15 LAB — NOVEL CORONAVIRUS, NAA: SARS-CoV-2, NAA: NOT DETECTED

## 2020-09-22 ENCOUNTER — Ambulatory Visit (INDEPENDENT_AMBULATORY_CARE_PROVIDER_SITE_OTHER): Payer: Medicare PPO | Admitting: Orthopaedic Surgery

## 2020-09-22 ENCOUNTER — Other Ambulatory Visit: Payer: Self-pay

## 2020-09-22 ENCOUNTER — Encounter: Payer: Self-pay | Admitting: Orthopaedic Surgery

## 2020-09-22 VITALS — Ht 63.0 in | Wt 246.0 lb

## 2020-09-22 DIAGNOSIS — M170 Bilateral primary osteoarthritis of knee: Secondary | ICD-10-CM

## 2020-09-22 MED ORDER — LIDOCAINE HCL 1 % IJ SOLN
2.0000 mL | INTRAMUSCULAR | Status: AC | PRN
  Administered 2020-09-22: 2 mL

## 2020-09-22 MED ORDER — BUPIVACAINE HCL 0.5 % IJ SOLN
2.0000 mL | INTRAMUSCULAR | Status: AC | PRN
  Administered 2020-09-22: 2 mL via INTRA_ARTICULAR

## 2020-09-22 NOTE — Progress Notes (Signed)
Office Visit Note   Patient: Danielle Pierce           Date of Birth: Jan 01, 1941           MRN: [id number] Visit Date: 09/22/2020              Requested by: Gayland Curry, DO [place],  [place] [address] PCP: Gayland Curry, DO   Assessment & Plan: Visit Diagnoses:  1. Bilateral primary osteoarthritis of knee     Plan: Danielle Pierce is having recurrent symptoms of osteoarthritis right knee.  Will inject with betamethasone and monitor response.  Has had good relief in the past with similar injection  Follow-Up Instructions: Return if symptoms worsen or fail to improve.   Orders:  Orders Placed This Encounter  Procedures  . Large Joint Inj: R knee   No orders of the defined types were placed in this encounter.     Procedures: Large Joint Inj: R knee on 09/22/2020 10:41 AM Indications: pain and diagnostic evaluation Details: 25 G 1.5 in needle, anteromedial approach  Arthrogram: No  Medications: 2 mL lidocaine 1 %; 2 mL bupivacaine 0.5 %  2 mL betamethasone injected right knee with Marcaine and Xylocaine Procedure, treatment alternatives, risks and benefits explained, specific risks discussed. Consent was given by the patient. Immediately prior to procedure a time out was called to verify the correct patient, procedure, equipment, support staff and site/side marked as required. Patient was prepped and draped in the usual sterile fashion.       Clinical Data: No additional findings.   Subjective: Chief Complaint  Patient presents with  . Right Knee - Pain, Follow-up  Patient presents today for a two week follow up on her right knee. She said that she has not noticed any improvement. She has reconsidered the injection and would like to get a cortisone injection today.   HPI  Review of Systems   Objective: Vital Signs: Ht 5\' 3"  (1.6 m)   Wt 246 lb (111.6 kg)   BMI 43.58 kg/m   Physical Exam Constitutional:      Appearance: She is  well-developed and well-nourished.  HENT:     Mouth/Throat:     Mouth: Oropharynx is clear and moist.  Eyes:     Extraocular Movements: EOM normal.     Pupils: Pupils are equal, round, and reactive to light.  Pulmonary:     Effort: Pulmonary effort is normal.  Skin:    General: Skin is warm and dry.  Neurological:     Mental Status: She is alert and oriented to person, place, and time.  Psychiatric:        Mood and Affect: Mood and affect normal.        Behavior: Behavior normal.     Ortho Exam large knees.  No obvious effusion right knee.  Full extension of flexed about 95 without instability.  Mostly medial joint pain.  Some very mild patella crepitation.  No opening laterally or medially.  No popliteal pain.  No calf pain  Specialty Comments:  No specialty comments available.  Imaging: No results found.   PMFS History: Patient Active Problem List   Diagnosis Date Noted  . Bilateral primary osteoarthritis of knee 09/08/2020  . Trigger thumb, right thumb 07/27/2020  . Body mass index (BMI) of 40.1-44.9 in adult ([place]) 11/14/2018  . Localized osteoarthritis of left knee 05/10/2017  . Female perineal bleeding 11/08/2016  . HLD (hyperlipidemia) 01/19/2016  .  Fasciitis 12/15/2015  . Corn 05/18/2015  . Excessive cerumen in both ear canals 05/18/2015  . Age spots 04/08/2014  . Blepharochalasis of right eye 04/08/2014  . Lumbago 04/08/2014  . Personal history of colonic adenomas 07/08/2013  . Family history of colon cancer- father in 89s 07/08/2013  . Internal and external bleeding hemorrhoids 07/08/2013  . DM (diabetes mellitus), type 2 with renal complications ([place]) 57/84/6962  . Essential hypertension 02/20/2013  . Obesity   . Vitamin D deficiency    Past Medical History:  Diagnosis Date  . Arthritis    Ankle  . Asymptomatic varicose veins   . Benign neoplasm of colon   . Bilateral shoulder pain 07/02/14  . Cataract    Bilateral - just watching  . Cervicitis  and endocervicitis 09/13/2011  . Disorder of bone and cartilage, unspecified   . Disorders of bursae and tendons in shoulder region, unspecified   . External hemorrhoids without mention of complication   . Fibrocystic breast   . Generalized osteoarthrosis, unspecified site   . GERD (gastroesophageal reflux disease)    diet controlled, No meds  . Hiatal hernia   . Hypertension   . Impacted cerumen 11/07/2011  . Obesity, unspecified   . Other diseases of nasal cavity and sinuses(478.19)   . Pain in joint, ankle and foot   . Pain in joint, pelvic region and thigh   . Pain in joint, shoulder region   . Reflux esophagitis   . SVD (spontaneous vaginal delivery)    x 2  . Unspecified essential hypertension   . Unspecified vitamin D deficiency     Family History  Problem Relation Age of Onset  . Hypertension Mother   . Kidney disease Mother        Renal failure  . Cancer Brother        Lymphoma  . ADD / ADHD Son   . Seizures Son   . Hypertension Sister   . Dementia Sister   . Hypertension Sister   . Hypertension Sister   . Cancer - Other Sister   . Early death Brother        Some type of accident  . Colon cancer Father 69  . Stomach cancer Neg Hx   . Rectal cancer Neg Hx     Past Surgical History:  Procedure Laterality Date  . COLONOSCOPY  2006   Dr.Orr  . COLONOSCOPY  07/08/2013   Dr. Carlean Purl  . [place]   Hemorrhoids   . MOUTH SURGERY  2005   DR LUTINS - tooth ext and gum surgery  . SHOULDER ARTHROSCOPY W/ ACROMIAL REPAIR Right 07/02/14   Dr. Durward Fortes  . UPPER GASTROINTESTINAL ENDOSCOPY     normal   Social History   Occupational History  . Occupation: retired Landscape architect  Tobacco Use  . Smoking status: Never Smoker  . Smokeless tobacco: Never Used  Vaping Use  . Vaping Use: Never used  Substance and Sexual Activity  . Alcohol use: Yes    Comment: occasional mixed drink   . Drug use: No  . Sexual activity: Not Currently     Birth control/protection: Post-menopausal

## 2020-09-27 ENCOUNTER — Ambulatory Visit (INDEPENDENT_AMBULATORY_CARE_PROVIDER_SITE_OTHER): Payer: Medicare PPO | Admitting: Family

## 2020-09-27 ENCOUNTER — Other Ambulatory Visit: Payer: Self-pay

## 2020-09-27 ENCOUNTER — Encounter: Payer: Self-pay | Admitting: Family

## 2020-09-27 VITALS — BP 160/100 | HR 82 | Temp 98.4°F | Resp 16 | Ht 63.0 in

## 2020-09-27 DIAGNOSIS — R058 Other specified cough: Secondary | ICD-10-CM

## 2020-09-27 DIAGNOSIS — R600 Localized edema: Secondary | ICD-10-CM | POA: Diagnosis not present

## 2020-09-27 DIAGNOSIS — J069 Acute upper respiratory infection, unspecified: Secondary | ICD-10-CM

## 2020-09-27 LAB — POCT INFLUENZA A/B
Influenza A, POC: NEGATIVE
Influenza B, POC: NEGATIVE

## 2020-09-27 MED ORDER — SACCHAROMYCES BOULARDII 250 MG PO CAPS: 250.0000 mg | ORAL_CAPSULE | Freq: Two times a day (BID) | ORAL | 0 refills | Status: AC

## 2020-09-27 MED ORDER — GUAIFENESIN ER 600 MG PO TB12: 600.0000 mg | ORAL_TABLET | Freq: Two times a day (BID) | ORAL | 0 refills | Status: AC

## 2020-09-27 MED ORDER — DOXYCYCLINE HYCLATE 100 MG PO TABS
100.0000 mg | ORAL_TABLET | Freq: Two times a day (BID) | ORAL | 0 refills | Status: DC
Start: 2020-09-27 — End: 2020-12-16

## 2020-09-27 MED ORDER — PREDNISONE 10 MG PO TABS: ORAL_TABLET | ORAL | 0 refills | Status: DC

## 2020-09-27 NOTE — Progress Notes (Signed)
Provider: Dailan Pfalzgraf FNP-C  Gayland Curry, DO  Patient Care Team: Gayland Curry, DO as PCP - General (Geriatric Medicine) Garald Balding, MD as Consulting Physician (Orthopedic Surgery) Rutherford Guys, MD as Consulting Physician (Ophthalmology) Druscilla Brownie, MD as Consulting Physician (Dermatology) Gatha Mayer, MD as Consulting Physician (Gastroenterology)  Extended Emergency Contact Information Primary Emergency Contact: Ewart,Luther Address: [address]          [place], [place] [address] [place] Phone: [phone] Mobile Phone: [phone] Relation: Spouse Secondary Emergency Contact: Magnus Sinning [place] Phone: [phone] Mobile Phone: [phone] Relation: Daughter  Code Status:  Full Code  Goals of care: Advanced Directive information Advanced Directives 09/27/2020  Does Patient Have a Medical Advance Directive? Yes  Type of Paramedic of [place];Living will;Out of facility DNR (pink MOST or yellow form)  Does patient want to make changes to medical advance directive? No - Patient declined  Copy of [place] in Chart? No - copy requested  Would patient like information on creating a medical advance directive? -     Chief Complaint  Patient presents with   Acute Visit    Believes daughter gave her Bronchitis.    HPI:  Pt is a 79 y.o. female seen today for an acute visit for evaluation of wheezing since last night.also cough up yellowish color mucus.Has taken cold tablet.She denies any generalized body aches,fever,chill,nausea,vomitng,diarrhea loss of taste or smell.Also denies any shortness of breath,dyspnea or Orthopnea.  Her daughter had bronchitis took her to the ED was given some medication.Apptite has been good . States over the counter cold medication which made her blood pressure go up. She denies any chest tightness,chest pain or  palpitation.  Has completed her two Pfzier vaccine and booster.   Past Medical History:  Diagnosis Date   Arthritis    Ankle   Asymptomatic varicose veins    Benign neoplasm of colon    Bilateral shoulder pain 07/02/14   Cataract    Bilateral - just watching   Cervicitis and endocervicitis 09/13/2011   Disorder of bone and cartilage, unspecified    Disorders of bursae and tendons in shoulder region, unspecified    External hemorrhoids without mention of complication    Fibrocystic breast    Generalized osteoarthrosis, unspecified site    GERD (gastroesophageal reflux disease)    diet controlled, No meds   Hiatal hernia    Hypertension    Impacted cerumen 11/07/2011   Obesity, unspecified    Other diseases of nasal cavity and sinuses(478.19)    Pain in joint, ankle and foot    Pain in joint, pelvic region and thigh    Pain in joint, shoulder region    Reflux esophagitis    SVD (spontaneous vaginal delivery)    x 2   Unspecified essential hypertension    Unspecified vitamin D deficiency    Past Surgical History:  Procedure Laterality Date   COLONOSCOPY  2006   Dr.Orr   COLONOSCOPY  07/08/2013   Dr. Carlean Purl   FLEXIBLE SIGMOIDOSCOPY  1990   Hemorrhoids    MOUTH SURGERY  2005   DR LUTINS - tooth ext and gum surgery   SHOULDER ARTHROSCOPY W/ ACROMIAL REPAIR Right 07/02/14   Dr. Durward Fortes   UPPER GASTROINTESTINAL ENDOSCOPY     normal    No Known Allergies  Outpatient Encounter Medications as of 09/27/2020  Medication Sig   aspirin 81 MG tablet Take 81  mg by mouth daily. Take 1 tablet once a day.   Calcium Carbonate-Vitamin D (CALCIUM-VITAMIN D) 500-200 MG-UNIT per tablet Take 1 tablet by mouth daily. Take 1 tablet twice a day to help bones.   Carboxymethylcell-Hypromellose 0.25-0.3 % GEL Apply 1 application to eye daily. Use 1 application to each eye as needed to alleviate irritation.   Cholecalciferol (VITAMIN D-3 PO) Take 1 tablet by  mouth daily.   hydrocortisone (ANUSOL-HC) 25 MG suppository Place 1 suppository (25 mg total) rectally daily as needed for hemorrhoids or anal itching.   losartan-hydrochlorothiazide (HYZAAR) 50-12.5 MG tablet TAKE 1 TABLET ONCE DAILY.   vitamin B-12 (CYANOCOBALAMIN) 1000 MCG tablet Take 1,000 mcg by mouth daily. Take 1 tablet once a day.   vitamin C (ASCORBIC ACID) 500 MG tablet Take 500 mg by mouth daily. Take 1 tablet once a day.   vitamin E 400 UNIT capsule Take 400 Units by mouth daily. Take 1 capsule once a day.   No facility-administered encounter medications on file as of 09/27/2020.    Review of Systems  Constitutional: Negative for appetite change and chills.  HENT: Positive for congestion and rhinorrhea. Negative for postnasal drip, sinus pressure, sinus pain, sneezing and sore throat.   Respiratory: Positive for wheezing. Negative for cough, chest tightness and shortness of breath.   Cardiovascular: Negative for chest pain, palpitations and leg swelling.  Gastrointestinal: Negative for abdominal distention, abdominal pain, constipation, diarrhea, nausea and vomiting.  Musculoskeletal: Positive for gait problem.  Skin: Negative for pallor and rash.  Neurological: Negative for dizziness, speech difficulty, weakness, light-headedness, numbness and headaches.    Immunization History  Administered Date(s) Administered   Fluad Quad(high Dose 65+) 06/24/2019, 08/19/2020   Influenza, High Dose Seasonal PF 09/17/2017, 06/17/2018   PFIZER SARS-COV-2 Vaccination 11/08/2019, 11/29/2019, 07/30/2020   PPD Test 10/02/1997   Pneumococcal Conjugate-13 05/10/2017   Pneumococcal Polysaccharide-23 06/13/2018   Td 04/30/1998   Tdap 04/19/2020   Pertinent  Health Maintenance Due  Topic Date Due   HEMOGLOBIN A1C  02/14/2021   OPHTHALMOLOGY EXAM  03/23/2021   FOOT EXAM  04/19/2021   DEXA SCAN  Completed   PNA vac Low Risk Adult  Completed   Fall Risk  09/27/2020  08/19/2020 07/02/2020 04/19/2020 12/11/2019  Falls in the past year? 0 0 0 0 0  Number falls in past yr: 0 0 0 0 0  Injury with Fall? 0 0 0 0 0  Risk for fall due to : - - - - -  Follow up - - - - -   Functional Status Survey:    Vitals:   09/27/20 1608  BP: (!) 160/100  Pulse: 82  Resp: 16  Temp: 98.4 F (36.9 C)  SpO2: 97%  Height: 5\' 3"  (1.6 m)   Body mass index is 43.58 kg/m. Physical Exam Vitals reviewed.  Constitutional:      General: She is not in acute distress.    Appearance: She is not ill-appearing.  HENT:     Head: Normocephalic.     Right Ear: There is no impacted cerumen.     Left Ear: There is impacted cerumen.     Ears:     Comments: Decline ear lavage since not feeling well today will get it done on next visit.     Nose: Nose normal. No congestion or rhinorrhea.     Mouth/Throat:     Mouth: Mucous membranes are dry.     Pharynx: Oropharynx is clear. No oropharyngeal exudate or  posterior oropharyngeal erythema.  Eyes:     General: No scleral icterus.       Right eye: No discharge.        Left eye: No discharge.     Extraocular Movements: Extraocular movements intact.     Conjunctiva/sclera: Conjunctivae normal.     Pupils: Pupils are equal, round, and reactive to light.  Neck:     Vascular: No carotid bruit.  Cardiovascular:     Rate and Rhythm: Normal rate and regular rhythm.     Pulses: Normal pulses.     Heart sounds: Normal heart sounds. No murmur heard. No friction rub. No gallop.   Pulmonary:     Effort: Pulmonary effort is normal. No respiratory distress.     Breath sounds: Examination of the left-middle field reveals rales. Examination of the right-lower field reveals wheezing. Examination of the left-lower field reveals wheezing and rales. Wheezing and rales present. No rhonchi.  Chest:     Chest wall: No tenderness.  Abdominal:     General: Bowel sounds are normal. There is no distension.     Palpations: Abdomen is soft. There is no  mass.     Tenderness: There is no abdominal tenderness. There is no right CVA tenderness, left CVA tenderness, guarding or rebound.  Musculoskeletal:        General: No swelling or tenderness. Normal range of motion.     Cervical back: Normal range of motion. No rigidity or tenderness.     Right lower leg: Edema present.     Left lower leg: Edema present.     Comments: Bilateral lower extremities trace edema  Lymphadenopathy:     Cervical: No cervical adenopathy.  Skin:    General: Skin is warm and dry.     Coloration: Skin is not pale.     Findings: No bruising, erythema or rash.  Neurological:     Mental Status: She is alert and oriented to person, place, and time.     Cranial Nerves: No cranial nerve deficit.     Motor: No weakness.     Coordination: Coordination normal.     Gait: Gait normal.  Psychiatric:        Mood and Affect: Mood normal.        Behavior: Behavior normal.        Thought Content: Thought content normal.        Judgment: Judgment normal.     Labs reviewed: Recent Labs    04/08/20 0826 08/17/20 0905  NA 142 143  K 4.0 3.7  CL 108 108  CO2 26 23  GLUCOSE 101* 102*  BUN 12 15  CREATININE 1.01* 1.09*  CALCIUM 9.5 9.2   Recent Labs    04/08/20 0826  AST 23  ALT 12  BILITOT 0.6  PROT 6.5   Recent Labs    04/08/20 0826  WBC 7.3  NEUTROABS 3,811  HGB 13.1  HCT 38.7  MCV 93.3  PLT 193   No results found for: TSH Lab Results  Component Value Date   HGBA1C 6.2 (H) 08/17/2020   Lab Results  Component Value Date   CHOL 190 08/17/2020   HDL 60 08/17/2020   LDLCALC 115 (H) 08/17/2020   TRIG 63 08/17/2020   CHOLHDL 3.2 08/17/2020    Significant Diagnostic Results in last 30 days:  XR KNEE 3 VIEW RIGHT  Result Date: 09/08/2020 Films of the right knee were obtained in 3 projections standing.  There are significant degenerative  changes of the patellofemoral joint associated with lateral tilting and near bone-on-bone of the lateral  patellofemoral articulation.  Decreased medial joint space with possible faint calcification within the menisci consistent with CPPD.  Mild subchondral sclerosis on the medial joint line.  Films are consistent with moderate to advanced osteoarthritis without acute change   Assessment/Plan  1. Upper respiratory tract infection, unspecified type Afebrile.Lung wheezing noted with left mid lung rales noted.  - POC Influenza A/B negative.  - SARS-COV-2 RNA,(COVID-19) QUAL NAAT rule out COVID-19  - doxycycline (VIBRA-TABS) 100 MG tablet; Take 1 tablet (100 mg total) by mouth 2 (two) times daily.  Dispense: 20 tablet; Refill: 0 - guaiFENesin (MUCINEX) 600 MG 12 hr tablet; Take 1 tablet (600 mg total) by mouth 2 (two) times daily for 14 days.  Dispense: 28 tablet; Refill: 0 - saccharomyces boulardii (FLORASTOR) 250 MG capsule; Take 1 capsule (250 mg total) by mouth 2 (two) times daily for 10 days.  Dispense: 20 capsule; Refill: 0 - continue on Vitamin C   2. Nonproductive cough Afebrile. Bilateral base wheezing and left mid - lung rales noted.will treat with tapered prednisone as below.side effects discussed.Instruction provided.Will start on Doxycycline as below.Advised to take along with Probiotics.  - DG Chest 2 View; Future - predniSONE (DELTASONE) 10 MG tablet; Take 6 tablets ( 60 mg ) by mouth daily x 1 day then  Take 5 tablet ( 50 mg ) by mouth daily x 1 day then  Take 4 tablet ( 40 mg ) by mouth daily x 1 day then  Take 3 tablet ( 30 mg ) by mouth daily x 1 day then  Take 2 Tablet ( 20 mg) by mouth daily x 1 day then  Take 1 tablet ( 10 mg ) by mouth daily x 1 day then stop.  Dispense: 15 tablet; Refill: 0 - doxycycline (VIBRA-TABS) 100 MG tablet; Take 1 tablet (100 mg total) by mouth 2 (two) times daily.  Dispense: 20 tablet; Refill: 0 - guaiFENesin (MUCINEX) 600 MG 12 hr tablet; Take 1 tablet (600 mg total) by mouth 2 (two) times daily for 14 days.  Dispense: 28 tablet; Refill: 0 -  saccharomyces boulardii (FLORASTOR) 250 MG capsule; Take 1 capsule (250 mg total) by mouth 2 (two) times daily for 10 days.  Dispense: 20 capsule; Refill: 0 -   3. Edema of both lower extremities Bilateral lower extremities trace edema.No abrupt weight gain noted.  - Advised to elevate legs when seated.  Family/ staff Communication: Reviewed plan of care with patient verbalized understanding.  Labs/tests ordered: - DG Chest 2 View; Future  Next Appointment: As needed if symptoms worsen or fail to improve.   Caesar Bookman, NP

## 2020-09-27 NOTE — Patient Instructions (Signed)
-   please get chest X-ray done at [address] [place] at [facility] will call you with results. - Increase water intake to 6-8 glasses of water - Notify provider or go to Ed  if symptoms worsen or fail to improve

## 2020-09-28 ENCOUNTER — Ambulatory Visit
Admission: RE | Admit: 2020-09-28 | Discharge: 2020-09-28 | Disposition: A | Discharge status: Home / Self Care | Payer: Medicare PPO | Source: Ambulatory Visit | Attending: Family | Admitting: Family

## 2020-09-28 DIAGNOSIS — R058 Other specified cough: Secondary | ICD-10-CM

## 2020-09-28 DIAGNOSIS — R059 Cough, unspecified: Secondary | ICD-10-CM | POA: Diagnosis not present

## 2020-09-29 LAB — SARS-COV-2 RNA,(COVID-19) QUALITATIVE NAAT: SARS CoV2 RNA: NOT DETECTED

## 2020-10-26 ENCOUNTER — Other Ambulatory Visit: Payer: Self-pay

## 2020-10-26 ENCOUNTER — Other Ambulatory Visit: Payer: Medicare PPO

## 2020-10-26 DIAGNOSIS — Z20822 Contact with and (suspected) exposure to covid-19: Secondary | ICD-10-CM | POA: Diagnosis not present

## 2020-10-27 LAB — SARS-COV-2, NAA 2 DAY TAT

## 2020-10-27 LAB — NOVEL CORONAVIRUS, NAA: SARS-CoV-2, NAA: NOT DETECTED

## 2020-11-22 ENCOUNTER — Encounter: Payer: Self-pay | Admitting: Internal Medicine

## 2020-12-13 ENCOUNTER — Other Ambulatory Visit: Payer: Self-pay

## 2020-12-13 ENCOUNTER — Other Ambulatory Visit: Payer: Medicare PPO

## 2020-12-13 DIAGNOSIS — E1122 Type 2 diabetes mellitus with diabetic chronic kidney disease: Secondary | ICD-10-CM | POA: Diagnosis not present

## 2020-12-13 DIAGNOSIS — N181 Chronic kidney disease, stage 1: Secondary | ICD-10-CM | POA: Diagnosis not present

## 2020-12-13 DIAGNOSIS — I10 Essential (primary) hypertension: Secondary | ICD-10-CM | POA: Diagnosis not present

## 2020-12-13 DIAGNOSIS — E785 Hyperlipidemia, unspecified: Secondary | ICD-10-CM | POA: Diagnosis not present

## 2020-12-14 LAB — COMPLETE METABOLIC PANEL WITH GFR
AG Ratio: 1.9 (calc) (ref 1.0–2.5)
ALT: 18 U/L (ref 6–29)
AST: 22 U/L (ref 10–35)
Albumin: 4.2 g/dL (ref 3.6–5.1)
Alkaline phosphatase (APISO): 82 U/L (ref 37–153)
BUN/Creatinine Ratio: 14 (calc) (ref 6–22)
BUN: 14 mg/dL (ref 7–25)
CO2: 25 mmol/L (ref 20–32)
Calcium: 9.6 mg/dL (ref 8.6–10.4)
Chloride: 107 mmol/L (ref 98–110)
Creat: 1.01 mg/dL — ABNORMAL HIGH (ref 0.60–0.93)
GFR, Est African American: 62 mL/min/{1.73_m2} (ref >=60)
GFR, Est Non African American: 53 mL/min/{1.73_m2} — ABNORMAL LOW (ref >=60)
Globulin: 2.2 g/dL (calc) (ref 1.9–3.7)
Glucose, Bld: 112 mg/dL — ABNORMAL HIGH (ref 65–99)
Potassium: 3.8 mmol/L (ref 3.5–5.3)
Sodium: 142 mmol/L (ref 135–146)
Total Bilirubin: 0.7 mg/dL (ref 0.2–1.2)
Total Protein: 6.4 g/dL (ref 6.1–8.1)

## 2020-12-14 LAB — CBC WITH DIFFERENTIAL/PLATELET
Absolute Monocytes: 569 cells/uL (ref 200–950)
Basophils Absolute: 39 cells/uL (ref 0–200)
Basophils Relative: 0.5 %
Eosinophils Absolute: 140 cells/uL (ref 15–500)
Eosinophils Relative: 1.8 %
HCT: 40.8 % (ref 35.0–45.0)
Hemoglobin: 13.7 g/dL (ref 11.7–15.5)
Lymphs Abs: 3416 cells/uL (ref 850–3900)
MCH: 31.3 pg (ref 27.0–33.0)
MCHC: 33.6 g/dL (ref 32.0–36.0)
MCV: 93.2 fL (ref 80.0–100.0)
MPV: 10.5 fL (ref 7.5–12.5)
Monocytes Relative: 7.3 %
Neutro Abs: 3635 cells/uL (ref 1500–7800)
Neutrophils Relative %: 46.6 %
Platelets: 222 10*3/uL (ref 140–400)
RBC: 4.38 10*6/uL (ref 3.80–5.10)
RDW: 14.1 % (ref 11.0–15.0)
Total Lymphocyte: 43.8 %
WBC: 7.8 10*3/uL (ref 3.8–10.8)

## 2020-12-14 LAB — HEMOGLOBIN A1C
Hgb A1c MFr Bld: 6.3 % of total Hgb — ABNORMAL HIGH (ref <5.7)
Mean Plasma Glucose: 134 mg/dL
eAG (mmol/L): 7.4 mmol/L

## 2020-12-14 LAB — LIPID PANEL
Cholesterol: 218 mg/dL — ABNORMAL HIGH (ref <200)
HDL: 58 mg/dL (ref >=50)
LDL Cholesterol (Calc): 140 mg/dL (calc) — ABNORMAL HIGH
Non-HDL Cholesterol (Calc): 160 mg/dL (calc) — ABNORMAL HIGH (ref <130)
Total CHOL/HDL Ratio: 3.8 (calc) (ref <5.0)
Triglycerides: 94 mg/dL (ref <150)

## 2020-12-14 NOTE — Progress Notes (Signed)
Sugar average and cholesterol have trended up. Blood counts, electrolytes, liver normal Kidneys stable.

## 2020-12-16 ENCOUNTER — Encounter: Payer: Self-pay | Admitting: Internal Medicine

## 2020-12-16 ENCOUNTER — Ambulatory Visit (INDEPENDENT_AMBULATORY_CARE_PROVIDER_SITE_OTHER): Payer: Medicare PPO | Admitting: Internal Medicine

## 2020-12-16 ENCOUNTER — Other Ambulatory Visit: Payer: Self-pay

## 2020-12-16 VITALS — BP 134/72 | HR 68 | Temp 97.1°F | Ht 63.0 in | Wt 248.0 lb

## 2020-12-16 DIAGNOSIS — E785 Hyperlipidemia, unspecified: Secondary | ICD-10-CM

## 2020-12-16 DIAGNOSIS — N181 Chronic kidney disease, stage 1: Secondary | ICD-10-CM | POA: Diagnosis not present

## 2020-12-16 DIAGNOSIS — I10 Essential (primary) hypertension: Secondary | ICD-10-CM

## 2020-12-16 DIAGNOSIS — E1122 Type 2 diabetes mellitus with diabetic chronic kidney disease: Secondary | ICD-10-CM | POA: Diagnosis not present

## 2020-12-16 DIAGNOSIS — M170 Bilateral primary osteoarthritis of knee: Secondary | ICD-10-CM

## 2020-12-16 NOTE — Progress Notes (Signed)
Location:  [facility] clinic Provider:  Gerldine Suleiman L. Mariea Clonts, D.O., C.M.D. Goals of Care:  Advanced Directives 12/16/2020  Does Patient Have a Medical Advance Directive? Yes  Type of Paramedic of [place];Living will  Does patient want to make changes to medical advance directive? No - Patient declined  Copy of [place] in Chart? No - copy requested  Would patient like information on creating a medical advance directive? -   No chief complaint on file.  HPI: Patient is a 80 y.o. female seen today for medical management of chronic diseases.    The right knee is doing better.  She went to Dr. Durward Fortes in Dec.  Didn't seem to help at first, but gradually has gotten better--he said she had a little osteoarthritis and taking tylenol.  [facility]  [facility] off due to that pain.  Started with 1/2 mile and working her way up.    She had not been eating like she should and got constipated--had one time with a little bright red blood from her hemorrhoids.  None since. Is eating her veggies.  Likes a mediterranean salad from [employer].  Puts a light dressing on it.  Has kale in it.    Sugar average was up to 6.3 from 6.2.  LDL went from 115 to 140.  She had not been walking during this time until a week ago.  Not doing many fried foods.  BP good.  Past Medical History:  Diagnosis Date  . Arthritis    Ankle  . Asymptomatic varicose veins   . Benign neoplasm of colon   . Bilateral shoulder pain 07/02/14  . Cataract    Bilateral - just watching  . Cervicitis and endocervicitis 09/13/2011  . Disorder of bone and cartilage, unspecified   . Disorders of bursae and tendons in shoulder region, unspecified   . External hemorrhoids without mention of complication   . Fibrocystic breast   . Generalized osteoarthrosis, unspecified site   . GERD (gastroesophageal reflux disease)    diet controlled, No meds  . Hiatal hernia   . Hypertension   .  Impacted cerumen 11/07/2011  . Obesity, unspecified   . Other diseases of nasal cavity and sinuses(478.19)   . Pain in joint, ankle and foot   . Pain in joint, pelvic region and thigh   . Pain in joint, shoulder region   . Reflux esophagitis   . SVD (spontaneous vaginal delivery)    x 2  . Unspecified essential hypertension   . Unspecified vitamin D deficiency     Past Surgical History:  Procedure Laterality Date  . COLONOSCOPY  2006   Dr.Orr  . COLONOSCOPY  07/08/2013   Dr. Carlean Purl  . [place]   Hemorrhoids   . MOUTH SURGERY  2005   DR LUTINS - tooth ext and gum surgery  . SHOULDER ARTHROSCOPY W/ ACROMIAL REPAIR Right 07/02/14   Dr. Durward Fortes  . UPPER GASTROINTESTINAL ENDOSCOPY     normal    No Known Allergies  Outpatient Encounter Medications as of 12/16/2020  Medication Sig  . aspirin 81 MG tablet Take 81 mg by mouth daily. Take 1 tablet once a day.  . Calcium Carbonate-Vitamin D (CALCIUM-VITAMIN D) 500-200 MG-UNIT per tablet Take 1 tablet by mouth daily. Take 1 tablet twice a day to help bones.  . Carboxymethylcell-Hypromellose 0.25-0.3 % GEL Apply 1 application to eye daily. Use 1 application to each eye  as needed to alleviate irritation.  . Cholecalciferol (VITAMIN D-3 PO) Take 1 tablet by mouth daily.  . hydrocortisone (ANUSOL-HC) 25 MG suppository Place 1 suppository (25 mg total) rectally daily as needed for hemorrhoids or anal itching.  . losartan-hydrochlorothiazide (HYZAAR) 50-12.5 MG tablet TAKE 1 TABLET ONCE DAILY.  Marland Kitchen TURMERIC PO Take 1 tablet by mouth daily at 12 noon. Also taking turmeric liquid supplement  . vitamin B-12 (CYANOCOBALAMIN) 1000 MCG tablet Take 1,000 mcg by mouth daily. Take 1 tablet once a day.  . vitamin C (ASCORBIC ACID) 500 MG tablet Take 500 mg by mouth daily. Take 1 tablet once a day.  . vitamin E 400 UNIT capsule Take 400 Units by mouth daily. Take 1 capsule once a day.  . [DISCONTINUED] doxycycline (VIBRA-TABS) 100 MG  tablet Take 1 tablet (100 mg total) by mouth 2 (two) times daily.  . [DISCONTINUED] predniSONE (DELTASONE) 10 MG tablet Take 6 tablets ( 60 mg ) by mouth daily x 1 day then  Take 5 tablet ( 50 mg ) by mouth daily x 1 day then  Take 4 tablet ( 40 mg ) by mouth daily x 1 day then  Take 3 tablet ( 30 mg ) by mouth daily x 1 day then  Take 2 Tablet ( 20 mg) by mouth daily x 1 day then  Take 1 tablet ( 10 mg ) by mouth daily x 1 day then stop.   No facility-administered encounter medications on file as of 12/16/2020.    Review of Systems:  Review of Systems  Constitutional: Negative for chills, fever and malaise/fatigue.  HENT: Negative for congestion, hearing loss and sore throat.   Eyes: Negative for blurred vision.  Respiratory: Negative for cough and shortness of breath.   Cardiovascular: Positive for leg swelling. Negative for chest pain and palpitations.       Left leg swells a little at times, not consistent  Gastrointestinal: Negative for abdominal pain and constipation.  Genitourinary: Negative for dysuria.  Musculoskeletal: Positive for joint pain. Negative for falls.  Skin: Negative for itching and rash.  Neurological: Negative for dizziness and loss of consciousness.  Psychiatric/Behavioral: Negative for depression and memory loss. The patient is not nervous/anxious and does not have insomnia.     Health Maintenance  Topic Date Due  . OPHTHALMOLOGY EXAM  03/23/2021  . FOOT EXAM  04/19/2021  . HEMOGLOBIN A1C  06/15/2021  . TETANUS/TDAP  04/19/2030  . DEXA SCAN  Completed  . COVID-19 Vaccine  Completed  . Hepatitis C Screening  Completed  . PNA vac Low Risk Adult  Completed  . HPV VACCINES  Aged Out    Physical Exam: Vitals:   12/16/20 1014  BP: 134/72  Pulse: 68  Temp: (!) 97.1 F (36.2 C)  TempSrc: Temporal  SpO2: 98%  Weight: 248 lb (112.5 kg)  Height: 5\' 3"  (1.6 m)   Body mass index is 43.93 kg/m. Physical Exam Vitals reviewed.  Constitutional:       Appearance: Normal appearance.  Eyes:     Extraocular Movements: Extraocular movements intact.     Pupils: Pupils are equal, round, and reactive to light.  Cardiovascular:     Rate and Rhythm: Normal rate and regular rhythm.     Pulses: Normal pulses.     Heart sounds: Normal heart sounds.  Pulmonary:     Effort: Pulmonary effort is normal.     Breath sounds: Normal breath sounds. No wheezing, rhonchi or rales.  Abdominal:  General: Bowel sounds are normal.  Musculoskeletal:        General: Normal range of motion.     Right lower leg: No edema.     Left lower leg: Edema present.     Comments: Right knee mildly tender  Skin:    General: Skin is warm and dry.  Neurological:     General: No focal deficit present.     Mental Status: She is alert and oriented to person, place, and time.     Motor: No weakness.  Psychiatric:        Mood and Affect: Mood normal.        Behavior: Behavior normal.        Thought Content: Thought content normal.        Judgment: Judgment normal.     Labs reviewed: Basic Metabolic Panel: Recent Labs    04/08/20 0826 08/17/20 0905 12/13/20 0856  NA 142 143 142  K 4.0 3.7 3.8  CL 108 108 107  CO2 26 23 25   GLUCOSE 101* 102* 112*  BUN 12 15 14   CREATININE 1.01* 1.09* 1.01*  CALCIUM 9.5 9.2 9.6   Liver Function Tests: Recent Labs    04/08/20 0826 12/13/20 0856  AST 23 22  ALT 12 18  BILITOT 0.6 0.7  PROT 6.5 6.4   No results for input(s): LIPASE, AMYLASE in the last 8760 hours. No results for input(s): AMMONIA in the last 8760 hours. CBC: Recent Labs    04/08/20 0826 12/13/20 0856  WBC 7.3 7.8  NEUTROABS 3,811 3,635  HGB 13.1 13.7  HCT 38.7 40.8  MCV 93.3 93.2  PLT 193 222   Lipid Panel: Recent Labs    04/08/20 0826 08/17/20 0905 12/13/20 0856  CHOL 200* 190 218*  HDL 57 60 58  LDLCALC 127* 115* 140*  TRIG 67 63 94  CHOLHDL 3.5 3.2 3.8   Lab Results  Component Value Date   HGBA1C 6.3 (H) 12/13/2020      Assessment/Plan 1. Type 2 diabetes mellitus with stage 1 chronic kidney disease, without long-term current use of insulin (HCC) - control had worsened w/o walking - has historically been so much better after exercising  - is also back on track with diet now - Hemoglobin A1c; Future  2. Morbid obesity ([place]) -encouraged diet and exercise program  3. Essential hypertension -bp at goal, cont same regimen and monitor  4. Hyperlipidemia, unspecified hyperlipidemia type - cont to work on diet, declines meds/statins - Lipid panel; Future  5. Bilateral primary osteoarthritis of knee -doing better with right knee with exercise (mild on xray with ortho) -cont tylenol, may also use topicals if desired  Labs/tests ordered:   Lab Orders     Hemoglobin A1c     Lipid panel  Next appt:  4 mos med mgt with Jessica, fasting labs before  Aryam Zhan L. Perri Lamagna, D.O. [facility] [address], [place] [address] Cell Phone (Mon-Fri 8am-5pm):  [phone] On Call:  [phone] & follow prompts after 5pm & weekends Office Phone:  [phone] Office Fax:  [phone]

## 2020-12-16 NOTE — Patient Instructions (Signed)
[facility]'m [facility]  Keep up the good work with that and your improved eating habits.

## 2020-12-20 ENCOUNTER — Other Ambulatory Visit: Payer: Medicare PPO

## 2020-12-20 DIAGNOSIS — Z20822 Contact with and (suspected) exposure to covid-19: Secondary | ICD-10-CM | POA: Diagnosis not present

## 2021-01-18 ENCOUNTER — Ambulatory Visit: Payer: Medicare PPO | Attending: Critical Care Medicine

## 2021-01-18 DIAGNOSIS — Z20822 Contact with and (suspected) exposure to covid-19: Secondary | ICD-10-CM

## 2021-01-19 LAB — NOVEL CORONAVIRUS, NAA: SARS-CoV-2, NAA: NOT DETECTED

## 2021-01-19 LAB — SARS-COV-2, NAA 2 DAY TAT

## 2021-01-31 ENCOUNTER — Other Ambulatory Visit (HOSPITAL_BASED_OUTPATIENT_CLINIC_OR_DEPARTMENT_OTHER): Payer: Self-pay

## 2021-01-31 ENCOUNTER — Other Ambulatory Visit: Payer: Self-pay

## 2021-01-31 ENCOUNTER — Ambulatory Visit: Payer: Medicare PPO | Attending: Internal Medicine

## 2021-01-31 DIAGNOSIS — Z23 Encounter for immunization: Secondary | ICD-10-CM

## 2021-01-31 MED ORDER — PFIZER-BIONT COVID-19 VAC-TRIS 30 MCG/0.3ML IM SUSP
INTRAMUSCULAR | 0 refills | Status: DC
  Filled 2021-01-31: qty 0.3, 1d supply, fill #0

## 2021-01-31 NOTE — Progress Notes (Signed)
   Covid-19 Vaccination Clinic  Name:  Danielle Pierce    MRN: [id number] DOB: 1941-09-02  01/31/2021  [name] was observed post Covid-19 immunization for 15 minutes without incident. She was provided with Vaccine Information Sheet and instruction to access the V-Safe system.   [name] was instructed to call 911 with any severe reactions post vaccine: Marland Kitchen Difficulty breathing  . Swelling of face and throat  . A fast heartbeat  . A bad rash all over body  . Dizziness and weakness   Immunizations Administered    Name Date Dose VIS Date Route   PFIZER Comrnaty(Gray TOP) Covid-19 Vaccine 01/31/2021  2:40 PM 0.3 mL 09/09/2020 Intramuscular   Manufacturer: [employer]   Lot: [id number]   NDC: [id number]

## 2021-03-17 ENCOUNTER — Other Ambulatory Visit: Payer: Self-pay | Admitting: Nurse Practitioner

## 2021-04-06 ENCOUNTER — Ambulatory Visit: Payer: Medicare PPO | Attending: Internal Medicine

## 2021-04-06 DIAGNOSIS — Z20822 Contact with and (suspected) exposure to covid-19: Secondary | ICD-10-CM

## 2021-04-07 LAB — SPECIMEN STATUS REPORT

## 2021-04-07 LAB — SARS-COV-2, NAA 2 DAY TAT

## 2021-04-07 LAB — NOVEL CORONAVIRUS, NAA: SARS-CoV-2, NAA: NOT DETECTED

## 2021-04-20 ENCOUNTER — Ambulatory Visit: Payer: Medicare PPO | Attending: Internal Medicine

## 2021-04-20 DIAGNOSIS — Z20822 Contact with and (suspected) exposure to covid-19: Secondary | ICD-10-CM

## 2021-04-21 LAB — SARS-COV-2, NAA 2 DAY TAT

## 2021-04-21 LAB — NOVEL CORONAVIRUS, NAA: SARS-CoV-2, NAA: NOT DETECTED

## 2021-04-22 ENCOUNTER — Other Ambulatory Visit: Payer: Self-pay

## 2021-04-22 DIAGNOSIS — I10 Essential (primary) hypertension: Secondary | ICD-10-CM

## 2021-04-22 DIAGNOSIS — E1122 Type 2 diabetes mellitus with diabetic chronic kidney disease: Secondary | ICD-10-CM

## 2021-04-22 DIAGNOSIS — E785 Hyperlipidemia, unspecified: Secondary | ICD-10-CM

## 2021-04-25 ENCOUNTER — Other Ambulatory Visit: Payer: Self-pay

## 2021-04-25 ENCOUNTER — Other Ambulatory Visit: Payer: Medicare PPO

## 2021-04-25 DIAGNOSIS — N181 Chronic kidney disease, stage 1: Secondary | ICD-10-CM

## 2021-04-25 DIAGNOSIS — I10 Essential (primary) hypertension: Secondary | ICD-10-CM

## 2021-04-25 DIAGNOSIS — E785 Hyperlipidemia, unspecified: Secondary | ICD-10-CM

## 2021-04-25 DIAGNOSIS — E1122 Type 2 diabetes mellitus with diabetic chronic kidney disease: Secondary | ICD-10-CM

## 2021-04-26 ENCOUNTER — Telehealth: Payer: Self-pay | Admitting: Nurse Practitioner

## 2021-04-26 ENCOUNTER — Other Ambulatory Visit: Payer: Medicare PPO

## 2021-04-26 LAB — HEMOGLOBIN A1C
Hgb A1c MFr Bld: 6 % of total Hgb — ABNORMAL HIGH (ref <5.7)
Mean Plasma Glucose: 126 mg/dL
eAG (mmol/L): 7 mmol/L

## 2021-04-26 LAB — LIPID PANEL
Cholesterol: 223 mg/dL — ABNORMAL HIGH (ref <200)
HDL: 62 mg/dL (ref >=50)
LDL Cholesterol (Calc): 142 mg/dL (calc) — ABNORMAL HIGH
Non-HDL Cholesterol (Calc): 161 mg/dL (calc) — ABNORMAL HIGH (ref <130)
Total CHOL/HDL Ratio: 3.6 (calc) (ref <5.0)
Triglycerides: 85 mg/dL (ref <150)

## 2021-04-26 NOTE — Telephone Encounter (Signed)
Osteopenia noted on bone density. Appears somewhat improved from previous. Continue  vit D with weightbearing exercises as tolerates.

## 2021-04-26 NOTE — Telephone Encounter (Signed)
Patient notified and agreed.  

## 2021-04-29 ENCOUNTER — Encounter: Payer: Self-pay | Admitting: Nurse Practitioner

## 2021-04-29 ENCOUNTER — Ambulatory Visit: Payer: Medicare PPO | Admitting: Nurse Practitioner

## 2021-04-29 ENCOUNTER — Other Ambulatory Visit: Payer: Self-pay

## 2021-04-29 VITALS — BP 132/84 | HR 71 | Temp 97.5°F | Ht 63.0 in | Wt 245.0 lb

## 2021-04-29 DIAGNOSIS — I10 Essential (primary) hypertension: Secondary | ICD-10-CM

## 2021-04-29 DIAGNOSIS — N181 Chronic kidney disease, stage 1: Secondary | ICD-10-CM | POA: Diagnosis not present

## 2021-04-29 DIAGNOSIS — M170 Bilateral primary osteoarthritis of knee: Secondary | ICD-10-CM

## 2021-04-29 DIAGNOSIS — E785 Hyperlipidemia, unspecified: Secondary | ICD-10-CM | POA: Diagnosis not present

## 2021-04-29 DIAGNOSIS — E1122 Type 2 diabetes mellitus with diabetic chronic kidney disease: Secondary | ICD-10-CM | POA: Diagnosis not present

## 2021-04-29 MED ORDER — ATORVASTATIN CALCIUM 10 MG PO TABS: 10.0000 mg | ORAL_TABLET | Freq: Every day | ORAL | 1 refills | Status: DC

## 2021-04-29 NOTE — Progress Notes (Signed)
Careteam: Patient Care Team: Lauree Chandler, NP as PCP - General (Geriatric Medicine) Garald Balding, MD as Consulting Physician (Orthopedic Surgery) Rutherford Guys, MD as Consulting Physician (Ophthalmology) Druscilla Brownie, MD as Consulting Physician (Dermatology) Gatha Mayer, MD as Consulting Physician (Gastroenterology)  PLACE OF SERVICE:  [place] Directive information Does Patient Have a Medical Advance Directive?: Yes, Type of Advance Directive: [place];Living will, Does patient want to make changes to medical advance directive?: No - Patient declined  No Known Allergies  Chief Complaint  Patient presents with   Medical Management of Chronic Issues    4 month follow-up. Discuss need for eye exam and shingrix (not in [facility]). Foot exam today.      HPI: Patient is a 80 y.o. female for routine follow up.  She went to [facility] for a week and it was very hot. She did a lot of walking now knee is bothering her.   DM- diet control.  Hyperlipidemia- does not eat fried foods. Trying to eat a low fat diet.   Htn- blood pressure at goal.   Followed by Dr Durward Fortes for right knee pain. Noted to have mild OA, had injections in the past which have been helpful.   Review of Systems:  Review of Systems  Constitutional:  Negative for chills, fever and weight loss.  HENT:  Negative for tinnitus.   Respiratory:  Negative for cough, sputum production and shortness of breath.   Cardiovascular:  Negative for chest pain, palpitations and leg swelling.  Gastrointestinal:  Negative for abdominal pain, constipation, diarrhea and heartburn.  Genitourinary:  Negative for dysuria, frequency and urgency.  Musculoskeletal:  Negative for back pain, falls, joint pain and myalgias.  Skin: Negative.   Neurological:  Negative for dizziness and headaches.  Psychiatric/Behavioral:  Negative for depression and memory loss. The patient does not have  insomnia.    Past Medical History:  Diagnosis Date   Arthritis    Ankle   Asymptomatic varicose veins    Benign neoplasm of colon    Bilateral shoulder pain 07/02/14   Cataract    Bilateral - just watching   Cervicitis and endocervicitis 09/13/2011   Disorder of bone and cartilage, unspecified    Disorders of bursae and tendons in shoulder region, unspecified    External hemorrhoids without mention of complication    Fibrocystic breast    Generalized osteoarthrosis, unspecified site    GERD (gastroesophageal reflux disease)    diet controlled, No meds   Hiatal hernia    Hypertension    Impacted cerumen 11/07/2011   Obesity, unspecified    Other diseases of nasal cavity and sinuses(478.19)    Pain in joint, ankle and foot    Pain in joint, pelvic region and thigh    Pain in joint, shoulder region    Reflux esophagitis    SVD (spontaneous vaginal delivery)    x 2   Unspecified essential hypertension    Unspecified vitamin D deficiency    Past Surgical History:  Procedure Laterality Date   COLONOSCOPY  2006   Dr.Orr   COLONOSCOPY  07/08/2013   Dr. Carlean Purl   FLEXIBLE SIGMOIDOSCOPY  1990   Hemorrhoids    MOUTH SURGERY  2005   DR LUTINS - tooth ext and gum surgery   SHOULDER ARTHROSCOPY W/ ACROMIAL REPAIR Right 07/02/14   Dr. Durward Fortes   UPPER GASTROINTESTINAL ENDOSCOPY     normal   Social History:   reports that  she has never smoked. She has never used smokeless tobacco. She reports current alcohol use. She reports that she does not use drugs.  Family History  Problem Relation Age of Onset   Hypertension Mother    Kidney disease Mother        Renal failure   Cancer Brother        Lymphoma   ADD / ADHD Son    Seizures Son    Hypertension Sister    Dementia Sister    Hypertension Sister    Hypertension Sister    Cancer - Other Sister    Early death Brother        Some type of accident   Colon cancer Father 85   Stomach cancer Neg Hx    Rectal cancer Neg Hx      Medications: Patient's Medications  New Prescriptions   No medications on file  Previous Medications   ASPIRIN 81 MG TABLET    Take 81 mg by mouth daily. Take 1 tablet once a day.   CALCIUM CARBONATE-VITAMIN D (CALCIUM-VITAMIN D) 500-200 MG-UNIT PER TABLET    Take 1 tablet by mouth daily. Take 1 tablet twice a day to help bones.   CARBOXYMETHYLCELL-HYPROMELLOSE 0.25-0.3 % GEL    Apply 1 application to eye daily. Use 1 application to each eye as needed to alleviate irritation.   CHOLECALCIFEROL (VITAMIN D-3 PO)    Take 1 tablet by mouth 2 (two) times daily.   HYDROCORTISONE (ANUSOL-HC) 25 MG SUPPOSITORY    Place 1 suppository (25 mg total) rectally daily as needed for hemorrhoids or anal itching.   LOSARTAN-HYDROCHLOROTHIAZIDE (HYZAAR) 50-12.5 MG TABLET    TAKE 1 TABLET ONCE DAILY.   TURMERIC PO    Take 1 tablet by mouth daily at 12 noon. Also taking turmeric liquid supplement   VITAMIN B-12 (CYANOCOBALAMIN) 1000 MCG TABLET    Take 1,000 mcg by mouth daily.   VITAMIN C (ASCORBIC ACID) 500 MG TABLET    Take 500 mg by mouth daily.   VITAMIN E 400 UNIT CAPSULE    Take 400 Units by mouth daily.  Modified Medications   No medications on file  Discontinued Medications   COVID-19 MRNA VAC-TRIS, PFIZER, (PFIZER-BIONT COVID-19 VAC-TRIS) SUSP INJECTION    Inject into the muscle.    Physical Exam:  Vitals:   04/29/21 0952  BP: 132/84  Pulse: 71  Temp: (!) 97.5 F (36.4 C)  TempSrc: Temporal  SpO2: 99%  Weight: 245 lb (111.1 kg)  Height: '5\' 3"'$  (1.6 m)   Body mass index is 43.4 kg/m. Wt Readings from Last 3 Encounters:  04/29/21 245 lb (111.1 kg)  12/16/20 248 lb (112.5 kg)  09/22/20 246 lb (111.6 kg)    Physical Exam Constitutional:      General: She is not in acute distress.    Appearance: She is well-developed. She is not diaphoretic.  HENT:     Head: Normocephalic and atraumatic.     Mouth/Throat:     Pharynx: No oropharyngeal exudate.  Eyes:     Conjunctiva/sclera:  Conjunctivae normal.     Pupils: Pupils are equal, round, and reactive to light.  Cardiovascular:     Rate and Rhythm: Normal rate and regular rhythm.     Heart sounds: Normal heart sounds.  Pulmonary:     Effort: Pulmonary effort is normal.     Breath sounds: Normal breath sounds.  Abdominal:     General: Bowel sounds are normal.  Palpations: Abdomen is soft.  Musculoskeletal:     Cervical back: Normal range of motion and neck supple.     Right lower leg: No edema.     Left lower leg: No edema.  Skin:    General: Skin is warm and dry.  Neurological:     Mental Status: She is alert.  Psychiatric:        Mood and Affect: Mood normal.    Labs reviewed: Basic Metabolic Panel: Recent Labs    08/17/20 0905 12/13/20 0856  NA 143 142  K 3.7 3.8  CL 108 107  CO2 23 25  GLUCOSE 102* 112*  BUN 15 14  CREATININE 1.09* 1.01*  CALCIUM 9.2 9.6   Liver Function Tests: Recent Labs    12/13/20 0856  AST 22  ALT 18  BILITOT 0.7  PROT 6.4   No results for input(s): LIPASE, AMYLASE in the last 8760 hours. No results for input(s): AMMONIA in the last 8760 hours. CBC: Recent Labs    12/13/20 0856  WBC 7.8  NEUTROABS 3,635  HGB 13.7  HCT 40.8  MCV 93.2  PLT 222   Lipid Panel: Recent Labs    08/17/20 0905 12/13/20 0856 04/25/21 0913  CHOL 190 218* 223*  HDL 60 58 62  LDLCALC 115* 140* 142*  TRIG 63 94 85  CHOLHDL 3.2 3.8 3.6   TSH: No results for input(s): TSH in the last 8760 hours. A1C: Lab Results  Component Value Date   HGBA1C 6.0 (H) 04/25/2021     Assessment/Plan 1. Type 2 diabetes mellitus with stage 1 chronic kidney disease, without long-term current use of insulin (HCC) -A1c at goal. Encouraged dietary compliance, routine foot care/monitoring and to keep up with diabetic eye exams through ophthalmology.   2. Hyperlipidemia, unspecified hyperlipidemia type -LDL not at goal. Continue dietary modifications.she is agreeable to take medication.  Will start lipitor 10 mg daily at this time - atorvastatin (LIPITOR) 10 MG tablet; Take 1 tablet (10 mg total) by mouth at bedtime.  Dispense: 90 tablet; Refill: 1 - COMPLETE METABOLIC PANEL WITH GFR; Future - Lipid Panel; Future  3. Essential hypertension --stable. Goal bp <140/90. Continue on losartan-hctz with low sodium diet.   4. Morbid obesity ([place]) -education provided on healthy weight loss through increase in physical activity and proper nutrition   5. Bilateral primary osteoarthritis of knee -stable, followed by orthopedic. Has had injections with good results.   6. Osteopenia Continue cal and vit d with weight bearing exercise. Dexa scan up to date.    Next appt: 3 months, labs prior to [place] for cholesterol follow up  [place]. [employer], [facility] [phone]

## 2021-05-02 ENCOUNTER — Telehealth: Payer: Self-pay

## 2021-05-02 NOTE — Telephone Encounter (Signed)
Per Janett Billow: Osteopenia but improved since last, continue calcium and vit D  Discussed results with patient, patient verbalized understanding of results, copy of report sent for scanning.

## 2021-05-03 ENCOUNTER — Ambulatory Visit (INDEPENDENT_AMBULATORY_CARE_PROVIDER_SITE_OTHER): Payer: Medicare PPO

## 2021-05-03 ENCOUNTER — Other Ambulatory Visit: Payer: Self-pay

## 2021-05-03 ENCOUNTER — Encounter: Payer: Self-pay | Admitting: Orthopaedic Surgery

## 2021-05-03 ENCOUNTER — Ambulatory Visit (INDEPENDENT_AMBULATORY_CARE_PROVIDER_SITE_OTHER): Payer: Medicare PPO | Admitting: Orthopaedic Surgery

## 2021-05-03 VITALS — Ht 63.0 in | Wt 245.0 lb

## 2021-05-03 DIAGNOSIS — M79671 Pain in right foot: Secondary | ICD-10-CM

## 2021-05-03 DIAGNOSIS — M79673 Pain in unspecified foot: Secondary | ICD-10-CM | POA: Insufficient documentation

## 2021-05-03 NOTE — Progress Notes (Signed)
Office Visit Note   Patient: Danielle Pierce           Date of Birth: 1941/05/08           MRN: [id number] Visit Date: 05/03/2021              Requested by: Lauree Chandler, NP [place],  [place] [address] PCP: Lauree Chandler, NP   Assessment & Plan: Visit Diagnoses:  1. Pain of right heel     Plan: Recurrent symptoms of osteoarthritis right knee.  Will inject with cortisone.  Has done well in the past with cortisone.  Pain is predominate medially.  Developed onset of right heel pain several weeks ago without injury or trauma.  X-rays reveal a prominent plantar heel spur.  Symptoms are consistent with plantar fasciitis.  We will try comfortable shoes icing and monitor over the next several weeks.  Hopefully by injecting the knee it will help her foot  Follow-Up Instructions: No follow-ups on file.   Orders:  Orders Placed This Encounter  Procedures   XR Os Calcis Right   No orders of the defined types were placed in this encounter.     Procedures: No procedures performed   Clinical Data: No additional findings.   Subjective: Chief Complaint  Patient presents with   Right Knee - Pain  Patient presents today for recurrent right knee pain. She said that her knee is uncomfortable. She had a cortisone injection in December and states that it did not really help. She is having right heel pain that started two weeks ago.  No history of injury or trauma.  The pain in the heel is on the plantar aspect.  She is wearing good comfortable shoes  HPI  Review of Systems   Objective: Vital Signs: Ht '5\' 3"'$  (1.6 m)   Wt 245 lb (111.1 kg)   BMI 43.40 kg/m   Physical Exam Constitutional:      Appearance: She is well-developed.  Pulmonary:     Effort: Pulmonary effort is normal.  Skin:    General: Skin is warm and dry.  Neurological:     Mental Status: She is alert and oriented to person, place, and time.  Psychiatric:        Behavior: Behavior  normal.    Ortho Exam awake and alert.  Prominent osteophytes along the medial compartment right knee consistent with her prior x-rays and osteoarthritis.  Very minimal effusion.  I thought she lacked a few degrees to full extension but flexed over 100 degrees without instability.  Right heel with pain on the plantar aspect at the insertion of the plantar fashion on the medial heel.  Skin intact.  Specialty Comments:  No specialty comments available.  Imaging: XR Os Calcis Right  Result Date: 05/03/2021 Films of the right heel were obtained in 2 projections.  There are prominent posterior and plantar spurs.  Symptoms are consistent with plantar fasciitis    PMFS History: Patient Active Problem List   Diagnosis Date Noted   Heel pain 05/03/2021   Trigger thumb, right thumb 07/27/2020   Body mass index (BMI) of 40.1-44.9 in adult [facility]) 11/14/2018   Localized osteoarthritis of left knee 05/10/2017   Female perineal bleeding 11/08/2016   HLD (hyperlipidemia) 01/19/2016   Fasciitis 12/15/2015   Corn 05/18/2015   Excessive cerumen in both ear canals 05/18/2015   Age spots 04/08/2014   Blepharochalasis of right eye 04/08/2014   Lumbago 04/08/2014  Personal history of colonic adenomas 07/08/2013   Family history of colon cancer- father in 68s 07/08/2013   Internal and external bleeding hemorrhoids 07/08/2013   DM (diabetes mellitus), type 2 with renal complications ([place]) 123XX123   Essential hypertension 02/20/2013   Obesity    Vitamin D deficiency    Past Medical History:  Diagnosis Date   Arthritis    Ankle   Asymptomatic varicose veins    Benign neoplasm of colon    Bilateral shoulder pain 07/02/14   Cataract    Bilateral - just watching   Cervicitis and endocervicitis 09/13/2011   Disorder of bone and cartilage, unspecified    Disorders of bursae and tendons in shoulder region, unspecified    External hemorrhoids without mention of complication    Fibrocystic breast     Generalized osteoarthrosis, unspecified site    GERD (gastroesophageal reflux disease)    diet controlled, No meds   Hiatal hernia    Hypertension    Impacted cerumen 11/07/2011   Obesity, unspecified    Other diseases of nasal cavity and sinuses(478.19)    Pain in joint, ankle and foot    Pain in joint, pelvic region and thigh    Pain in joint, shoulder region    Reflux esophagitis    SVD (spontaneous vaginal delivery)    x 2   Unspecified essential hypertension    Unspecified vitamin D deficiency     Family History  Problem Relation Age of Onset   Hypertension Mother    Kidney disease Mother        Renal failure   Cancer Brother        Lymphoma   ADD / ADHD Son    Seizures Son    Hypertension Sister    Dementia Sister    Hypertension Sister    Hypertension Sister    Cancer - Other Sister    Early death Brother        Some type of accident   Colon cancer Father 60   Stomach cancer Neg Hx    Rectal cancer Neg Hx     Past Surgical History:  Procedure Laterality Date   COLONOSCOPY  2006   Dr.Orr   COLONOSCOPY  07/08/2013   Dr. Carlean Purl   FLEXIBLE SIGMOIDOSCOPY  1990   Hemorrhoids    MOUTH SURGERY  2005   DR LUTINS - tooth ext and gum surgery   SHOULDER ARTHROSCOPY W/ ACROMIAL REPAIR Right 07/02/14   Dr. Durward Fortes   UPPER GASTROINTESTINAL ENDOSCOPY     normal   Social History   Occupational History   Occupation: retired Landscape architect  Tobacco Use   Smoking status: Never   Smokeless tobacco: Never  Vaping Use   Vaping Use: Never used  Substance and Sexual Activity   Alcohol use: Yes    Comment: occasional mixed drink    Drug use: No   Sexual activity: Not Currently    Birth control/protection: Post-menopausal

## 2021-06-17 ENCOUNTER — Other Ambulatory Visit: Payer: Self-pay | Admitting: *Deleted

## 2021-06-17 MED ORDER — HYDROCORTISONE ACETATE 25 MG RE SUPP: 25.0000 mg | Freq: Every day | RECTAL | 0 refills | Status: AC | PRN

## 2021-06-17 NOTE — Telephone Encounter (Signed)
Danielle Pierce, with [facility] called requesting refill.  Pended Rx and sent to [facility] for approval.

## 2021-06-21 ENCOUNTER — Ambulatory Visit (INDEPENDENT_AMBULATORY_CARE_PROVIDER_SITE_OTHER): Payer: Medicare PPO | Admitting: Adult Health

## 2021-06-21 ENCOUNTER — Other Ambulatory Visit: Payer: Self-pay

## 2021-06-21 ENCOUNTER — Encounter: Payer: Self-pay | Admitting: Adult Health

## 2021-06-21 DIAGNOSIS — M549 Dorsalgia, unspecified: Secondary | ICD-10-CM | POA: Insufficient documentation

## 2021-06-21 DIAGNOSIS — R140 Abdominal distension (gaseous): Secondary | ICD-10-CM | POA: Insufficient documentation

## 2021-06-21 NOTE — Progress Notes (Signed)
Location:  [facility]   Place of Service:      CODE STATUS:   No Known Allergies  Chief Complaint  Patient presents with   Acute Visit    Patient complains of back pain and having some gas.     HPI:  This past Friday she has developed more gas; causing discomfort. She denies any nausea; no vomiting. No changes in bowel habits; no dysuria. She has not tried any thing to help relieve symptoms. She denies any changes in her diet and does not know of any pattern. She is complaining in the morning when she first wakes up that her upper back is "painful". As they day wears on the pain goes away by itself. She has not taken any pain medications.   Past Medical History:  Diagnosis Date   Arthritis    Ankle   Asymptomatic varicose veins    Benign neoplasm of colon    Bilateral shoulder pain 07/02/14   Cataract    Bilateral - just watching   Cervicitis and endocervicitis 09/13/2011   Disorder of bone and cartilage, unspecified    Disorders of bursae and tendons in shoulder region, unspecified    External hemorrhoids without mention of complication    Fibrocystic breast    Generalized osteoarthrosis, unspecified site    GERD (gastroesophageal reflux disease)    diet controlled, No meds   Hiatal hernia    Hypertension    Impacted cerumen 11/07/2011   Obesity, unspecified    Other diseases of nasal cavity and sinuses(478.19)    Pain in joint, ankle and foot    Pain in joint, pelvic region and thigh    Pain in joint, shoulder region    Reflux esophagitis    SVD (spontaneous vaginal delivery)    x 2   Unspecified essential hypertension    Unspecified vitamin D deficiency     Past Surgical History:  Procedure Laterality Date   COLONOSCOPY  2006   Dr.Orr   COLONOSCOPY  07/08/2013   Dr. Carlean Purl   FLEXIBLE SIGMOIDOSCOPY  1990   Hemorrhoids    MOUTH SURGERY  2005   DR LUTINS - tooth ext and gum surgery   SHOULDER ARTHROSCOPY W/ ACROMIAL REPAIR Right 07/02/14   Dr.  Durward Fortes   UPPER GASTROINTESTINAL ENDOSCOPY     normal    Social History   Socioeconomic History   Marital status: Widowed    Spouse name: Not on file   Number of children: Not on file   Years of education: Not on file   Highest education level: Not on file  Occupational History   Occupation: retired Landscape architect  Tobacco Use   Smoking status: Never   Smokeless tobacco: Never  Vaping Use   Vaping Use: Never used  Substance and Sexual Activity   Alcohol use: Yes    Comment: occasional mixed drink    Drug use: No   Sexual activity: Not Currently    Birth control/protection: Post-menopausal  Other Topics Concern   Not on file  Social History Narrative   Married    Never smoked   Alcohlol none   Exercise treadmill 3 times a week    Social Determinants of Health   Financial Resource Strain: Not on file  Food Insecurity: Not on file  Transportation Needs: Not on file  Physical Activity: Not on file  Stress: Not on file  Social Connections: Not on file  Intimate Partner Violence: Not on file  Family History  Problem Relation Age of Onset   Hypertension Mother    Kidney disease Mother        Renal failure   Cancer Brother        Lymphoma   ADD / ADHD Son    Seizures Son    Hypertension Sister    Dementia Sister    Hypertension Sister    Hypertension Sister    Cancer - Other Sister    Early death Brother        Some type of accident   Colon cancer Father 1   Stomach cancer Neg Hx    Rectal cancer Neg Hx       VITAL SIGNS BP 138/80   Pulse 81   Temp (!) 97 F (36.1 C)   Resp 18   Ht 5\' 3"  (1.6 m)   Wt 251 lb (113.9 kg)   SpO2 90%   BMI 44.46 kg/m   Outpatient Encounter Medications as of 06/21/2021  Medication Sig   aspirin 81 MG tablet Take 81 mg by mouth daily. Take 1 tablet once a day.   atorvastatin (LIPITOR) 10 MG tablet Take 1 tablet (10 mg total) by mouth at bedtime.   Calcium Carbonate-Vitamin D (CALCIUM-VITAMIN D) 500-200  MG-UNIT per tablet Take 1 tablet by mouth daily. Take 1 tablet twice a day to help bones.   Carboxymethylcell-Hypromellose 0.25-0.3 % GEL Apply 1 application to eye daily. Use 1 application to each eye as needed to alleviate irritation.   Cholecalciferol (VITAMIN D-3 PO) Take 1 tablet by mouth 2 (two) times daily.   hydrocortisone (ANUSOL-HC) 25 MG suppository Place 1 suppository (25 mg total) rectally daily as needed for hemorrhoids or anal itching.   losartan-hydrochlorothiazide (HYZAAR) 50-12.5 MG tablet TAKE 1 TABLET ONCE DAILY.   TURMERIC PO Take 1 tablet by mouth daily at 12 noon. Also taking turmeric liquid supplement   vitamin B-12 (CYANOCOBALAMIN) 1000 MCG tablet Take 1,000 mcg by mouth daily.   vitamin C (ASCORBIC ACID) 500 MG tablet Take 500 mg by mouth daily.   vitamin E 400 UNIT capsule Take 400 Units by mouth daily.   No facility-administered encounter medications on file as of 06/21/2021.     SIGNIFICANT DIAGNOSTIC EXAMS   Review of Systems  Constitutional:  Negative for malaise/fatigue.  Respiratory:  Negative for cough and shortness of breath.   Cardiovascular:  Negative for chest pain, palpitations and leg swelling.  Gastrointestinal:  Negative for abdominal pain, constipation, diarrhea, heartburn, nausea and vomiting.       Has increased gas   Genitourinary:  Negative for dysuria.  Musculoskeletal:  Positive for back pain. Negative for joint pain and myalgias.  Skin: Negative.   Neurological:  Negative for dizziness.  Psychiatric/Behavioral:  The patient is not nervous/anxious.     Physical Exam Constitutional:      General: She is not in acute distress.    Appearance: She is well-developed. She is obese. She is not diaphoretic.  Neck:     Thyroid: No thyromegaly.  Cardiovascular:     Rate and Rhythm: Normal rate and regular rhythm.     Pulses: Normal pulses.     Heart sounds: Normal heart sounds.  Pulmonary:     Effort: Pulmonary effort is normal. No  respiratory distress.     Breath sounds: Normal breath sounds.  Abdominal:     General: Bowel sounds are normal. There is no distension.     Palpations: Abdomen is soft.  Tenderness: There is no abdominal tenderness.  Musculoskeletal:        General: No deformity. Normal range of motion.     Cervical back: Normal range of motion and neck supple.     Right lower leg: No edema.     Left lower leg: No edema.  Lymphadenopathy:     Cervical: No cervical adenopathy.  Skin:    General: Skin is warm and dry.  Neurological:     Mental Status: She is alert and oriented to person, place, and time.  Psychiatric:        Mood and Affect: Mood normal.     ASSESSMENT/ PLAN:  TODAY  Fluctuance/gas pain/belching Upper back pain.   At this time will have her use gas X as directed on the label Will have her use tylenol CR nightly to help with pain If she is not better in the next one-two weeks; she is to message PCP in my chart She has verbalized understanding.    Ok Edwards NP [facility]  Contact [phone] Monday through Friday 8am- 5pm  After hours call [phone]

## 2021-06-30 ENCOUNTER — Other Ambulatory Visit: Payer: Self-pay

## 2021-06-30 ENCOUNTER — Ambulatory Visit (INDEPENDENT_AMBULATORY_CARE_PROVIDER_SITE_OTHER): Payer: Medicare PPO | Admitting: Family

## 2021-06-30 ENCOUNTER — Encounter: Payer: Self-pay | Admitting: Family

## 2021-06-30 VITALS — BP 142/96 | HR 102 | Temp 98.4°F | Resp 20

## 2021-06-30 DIAGNOSIS — R0989 Other specified symptoms and signs involving the circulatory and respiratory systems: Secondary | ICD-10-CM | POA: Diagnosis not present

## 2021-06-30 DIAGNOSIS — R059 Cough, unspecified: Secondary | ICD-10-CM

## 2021-06-30 LAB — POCT INFLUENZA A/B
Influenza A, POC: NEGATIVE
Influenza B, POC: NEGATIVE

## 2021-06-30 MED ORDER — GUAIFENESIN-DM 100-10 MG/5ML PO SYRP: 5.0000 mL | ORAL_SOLUTION | Freq: Three times a day (TID) | ORAL | 0 refills | Status: DC | PRN

## 2021-06-30 MED ORDER — ZINC GLUCONATE 50 MG PO TABS: 50.0000 mg | ORAL_TABLET | Freq: Every day | ORAL | 0 refills | Status: AC

## 2021-06-30 MED ORDER — LORATADINE 10 MG PO TABS: 10.0000 mg | ORAL_TABLET | Freq: Every day | ORAL | 0 refills | Status: DC

## 2021-06-30 NOTE — Progress Notes (Signed)
Provider: Tamatha Gadbois FNP-C  Lauree Chandler, NP  Patient Care Team: Lauree Chandler, NP as PCP - General (Geriatric Medicine) Garald Balding, MD as Consulting Physician (Orthopedic Surgery) Rutherford Guys, MD as Consulting Physician (Ophthalmology) Druscilla Brownie, MD as Consulting Physician (Dermatology) Gatha Mayer, MD as Consulting Physician (Gastroenterology)  Extended Emergency Contact Information Primary Emergency Contact: Delamater,Luther Address: [address]          [place], [place] [address] [place] Phone: [phone] Mobile Phone: [phone] Relation: Spouse Secondary Emergency Contact: [place] Phone: [phone] Mobile Phone: [phone] Relation: Daughter  Code Status:  Full Code  Goals of care: Advanced Directive information Advanced Directives 06/21/2021  Does Patient Have a Medical Advance Directive? Yes  Type of Paramedic of [place];Living will  Does patient want to make changes to medical advance directive? No - Patient declined  Copy of [place] in Chart? No - copy requested  Would patient like information on creating a medical advance directive? -     Chief Complaint  Patient presents with   Acute Visit    Patient presents today for a possible URI. She reports a cough and runny nose. She reports going to a game on Saturday, 06/25/21 night and started having symptoms afterwards.    HPI:  Pt is a 80 y.o. female seen today for an acute visit for evaluation of cough and runny nose.states was out on Saturday night for a game and did not have any long sleeves.got chilly.Has had runny nose and cough spell every now and then.took Mucinex yesterday which seemed to help.Has had no generalized aches ,fever,chills,SOB ,sore throat,chest tightness,chest pain or shortness of breath. States no contact with any sick person with COVID-19.she  has completed her COVID-19 vaccine.    Past Medical History:  Diagnosis Date   Arthritis    Ankle   Asymptomatic varicose veins    Benign neoplasm of colon    Bilateral shoulder pain 07/02/14   Cataract    Bilateral - just watching   Cervicitis and endocervicitis 09/13/2011   Disorder of bone and cartilage, unspecified    Disorders of bursae and tendons in shoulder region, unspecified    External hemorrhoids without mention of complication    Fibrocystic breast    Generalized osteoarthrosis, unspecified site    GERD (gastroesophageal reflux disease)    diet controlled, No meds   Hiatal hernia    Hypertension    Impacted cerumen 11/07/2011   Obesity, unspecified    Other diseases of nasal cavity and sinuses(478.19)    Pain in joint, ankle and foot    Pain in joint, pelvic region and thigh    Pain in joint, shoulder region    Reflux esophagitis    SVD (spontaneous vaginal delivery)    x 2   Unspecified essential hypertension    Unspecified vitamin D deficiency    Past Surgical History:  Procedure Laterality Date   COLONOSCOPY  2006   Dr.Orr   COLONOSCOPY  07/08/2013   Dr. Carlean Purl   FLEXIBLE SIGMOIDOSCOPY  1990   Hemorrhoids    MOUTH SURGERY  2005   DR LUTINS - tooth ext and gum surgery   SHOULDER ARTHROSCOPY W/ ACROMIAL REPAIR Right 07/02/14   Dr. Durward Fortes   UPPER GASTROINTESTINAL ENDOSCOPY     normal    No Known Allergies  Outpatient Encounter Medications as of 06/30/2021  Medication Sig   aspirin 81  MG tablet Take 81 mg by mouth daily. Take 1 tablet once a day.   atorvastatin (LIPITOR) 10 MG tablet Take 1 tablet (10 mg total) by mouth at bedtime.   Calcium Carbonate-Vitamin D (CALCIUM-VITAMIN D) 500-200 MG-UNIT per tablet Take 1 tablet by mouth daily. Take 1 tablet twice a day to help bones.   Carboxymethylcell-Hypromellose 0.25-0.3 % GEL Apply 1 application to eye daily. Use 1 application to each eye as needed to alleviate irritation.   Cholecalciferol (VITAMIN  D-3 PO) Take 1 tablet by mouth 2 (two) times daily.   hydrocortisone (ANUSOL-HC) 25 MG suppository Place 1 suppository (25 mg total) rectally daily as needed for hemorrhoids or anal itching.   losartan-hydrochlorothiazide (HYZAAR) 50-12.5 MG tablet TAKE 1 TABLET ONCE DAILY.   TURMERIC PO Take 1 tablet by mouth daily at 12 noon. Also taking turmeric liquid supplement   vitamin B-12 (CYANOCOBALAMIN) 1000 MCG tablet Take 1,000 mcg by mouth daily.   vitamin C (ASCORBIC ACID) 500 MG tablet Take 500 mg by mouth daily.   vitamin E 400 UNIT capsule Take 400 Units by mouth daily.   No facility-administered encounter medications on file as of 06/30/2021.    Review of Systems  Constitutional:  Negative for appetite change, chills, fatigue, fever and unexpected weight change.  HENT:  Positive for rhinorrhea. Negative for congestion, dental problem, ear discharge, ear pain, facial swelling, hearing loss, nosebleeds, postnasal drip, sinus pressure, sinus pain, sneezing, sore throat and tinnitus.   Eyes:  Negative for pain, discharge, redness, itching and visual disturbance.  Respiratory:  Positive for cough. Negative for chest tightness, shortness of breath and wheezing.   Cardiovascular:  Negative for chest pain, palpitations and leg swelling.  Gastrointestinal:  Negative for abdominal distention, abdominal pain, constipation, diarrhea, nausea and vomiting.  Musculoskeletal:  Negative for gait problem, myalgias and neck stiffness.  Skin:  Negative for color change, pallor, rash and wound.  Neurological:  Negative for dizziness, weakness, light-headedness and headaches.  Psychiatric/Behavioral:  Negative for sleep disturbance and suicidal ideas.    Immunization History  Administered Date(s) Administered   Fluad Quad(high Dose 65+) 06/24/2019, 08/19/2020   Influenza, High Dose Seasonal PF 09/17/2017, 06/17/2018   PFIZER Comirnaty(Gray Top)Covid-19 Tri-Sucrose Vaccine 01/31/2021   PFIZER(Purple  Top)SARS-COV-2 Vaccination 11/08/2019, 11/29/2019, 07/30/2020   PPD Test 10/02/1997   Pneumococcal Conjugate-13 05/10/2017   Pneumococcal Polysaccharide-23 06/13/2018   Td 04/30/1998   Tdap 04/19/2020   Pertinent  Health Maintenance Due  Topic Date Due   OPHTHALMOLOGY EXAM  03/23/2021   FOOT EXAM  04/19/2021   HEMOGLOBIN A1C  10/26/2021   DEXA SCAN  Completed   Fall Risk  06/21/2021 04/29/2021 12/16/2020 09/27/2020 08/19/2020  Falls in the past year? 0 0 0 0 0  Number falls in past yr: 0 0 0 0 0  Injury with Fall? 0 0 0 0 0  Risk for fall due to : No Fall Risks No Fall Risks - - -  Follow up Falls evaluation completed Falls evaluation completed - - -   Functional Status Survey:    Vitals:   06/30/21 1506  BP: (!) 142/96  Pulse: (!) 102  Resp: 20  Temp: 98.4 F (36.9 C)  TempSrc: Temporal  SpO2: 98%   There is no height or weight on file to calculate BMI. Physical Exam Vitals reviewed.  Constitutional:      General: She is not in acute distress.    Appearance: Normal appearance. She is normal weight. She is not ill-appearing or  diaphoretic.  HENT:     Head: Normocephalic.     Right Ear: Tympanic membrane, ear canal and external ear normal. There is no impacted cerumen.     Left Ear: Tympanic membrane, ear canal and external ear normal. There is no impacted cerumen.     Nose: Nose normal. No congestion or rhinorrhea.     Mouth/Throat:     Mouth: Mucous membranes are moist.     Pharynx: Oropharynx is clear. No oropharyngeal exudate or posterior oropharyngeal erythema.  Eyes:     General: No scleral icterus.       Right eye: No discharge.        Left eye: No discharge.     Conjunctiva/sclera: Conjunctivae normal.     Pupils: Pupils are equal, round, and reactive to light.  Cardiovascular:     Rate and Rhythm: Normal rate and regular rhythm.     Pulses: Normal pulses.     Heart sounds: Normal heart sounds. No murmur heard.   No friction rub. No gallop.  Pulmonary:      Effort: Pulmonary effort is normal. No respiratory distress.     Breath sounds: Normal breath sounds. No wheezing, rhonchi or rales.  Chest:     Chest wall: No tenderness.  Abdominal:     General: Bowel sounds are normal. There is no distension.     Palpations: Abdomen is soft. There is no mass.     Tenderness: There is no abdominal tenderness. There is no guarding or rebound.  Musculoskeletal:        General: No swelling or tenderness. Normal range of motion.     Cervical back: Normal range of motion. No rigidity or tenderness.     Right lower leg: No edema.     Left lower leg: No edema.  Lymphadenopathy:     Cervical: No cervical adenopathy.  Skin:    General: Skin is warm and dry.     Coloration: Skin is not pale.     Findings: No erythema or rash.  Neurological:     Mental Status: She is alert and oriented to person, place, and time.     Cranial Nerves: No cranial nerve deficit.     Sensory: No sensory deficit.     Motor: No weakness.     Coordination: Coordination normal.     Gait: Gait normal.  Psychiatric:        Mood and Affect: Mood normal.        Speech: Speech normal.    Labs reviewed: Recent Labs    08/17/20 0905 12/13/20 0856  NA 143 142  K 3.7 3.8  CL 108 107  CO2 23 25  GLUCOSE 102* 112*  BUN 15 14  CREATININE 1.09* 1.01*  CALCIUM 9.2 9.6   Recent Labs    12/13/20 0856  AST 22  ALT 18  BILITOT 0.7  PROT 6.4   Recent Labs    12/13/20 0856  WBC 7.8  NEUTROABS 3,635  HGB 13.7  HCT 40.8  MCV 93.2  PLT 222   No results found for: TSH Lab Results  Component Value Date   HGBA1C 6.0 (H) 04/25/2021   Lab Results  Component Value Date   CHOL 223 (H) 04/25/2021   HDL 62 04/25/2021   LDLCALC 142 (H) 04/25/2021   TRIG 85 04/25/2021   CHOLHDL 3.6 04/25/2021    Significant Diagnostic Results in last 30 days:  No results found.  Assessment/Plan  1. Upper respiratory symptom Afebrile.  Rhinorrhea  - advised to take loratadine and  zinc as below for 14 days  - continue on vitamin D and C  - SARS-COV-2 RNA,(COVID-19) QUAL NAAT - POCT Influenza A/B results negative  - loratadine (CLARITIN) 10 MG tablet; Take 1 tablet (10 mg total) by mouth daily for 14 days.  Dispense: 30 tablet; Refill: 0 - zinc gluconate 50 MG tablet; Take 1 tablet (50 mg total) by mouth daily for 14 days.  Dispense: 14 tablet; Refill: 0  2. Cough Bilateral lungs clear to Auscultation. -Advised to take Robitussin- DM  every 8 hrs as needed for cough  - Notify provider if symptoms worsen or fail to improve   Family/ staff Communication: Reviewed plan of care with patient verbalized understanding   Labs/tests ordered:  - SARS-COV-2 RNA,(COVID-19) QUAL NAAT - POCT Influenza A/B  Next Appointment: As needed if symptoms worsen or fail to improve    Sandrea Hughs, NP

## 2021-06-30 NOTE — Patient Instructions (Addendum)
-   increase fluid intake to 6-8 glasses daily  - Notify provider if symptoms worsen or fail to improve.

## 2021-07-01 LAB — SARS-COV-2 RNA,(COVID-19) QUALITATIVE NAAT: SARS CoV2 RNA: NOT DETECTED

## 2021-07-07 ENCOUNTER — Other Ambulatory Visit: Payer: Self-pay

## 2021-07-07 ENCOUNTER — Telehealth: Payer: Self-pay

## 2021-07-07 ENCOUNTER — Encounter: Payer: Self-pay | Admitting: Nurse Practitioner

## 2021-07-07 ENCOUNTER — Ambulatory Visit (INDEPENDENT_AMBULATORY_CARE_PROVIDER_SITE_OTHER): Payer: Medicare PPO | Admitting: Nurse Practitioner

## 2021-07-07 DIAGNOSIS — Z0000 Encounter for general adult medical examination without abnormal findings: Secondary | ICD-10-CM | POA: Diagnosis not present

## 2021-07-07 DIAGNOSIS — E785 Hyperlipidemia, unspecified: Secondary | ICD-10-CM

## 2021-07-07 MED ORDER — ATORVASTATIN CALCIUM 10 MG PO TABS: 10.0000 mg | ORAL_TABLET | Freq: Every day | ORAL | 1 refills | Status: DC

## 2021-07-07 NOTE — Telephone Encounter (Signed)
Ms. anjelica, gorniak are scheduled for a virtual visit with your provider today.    Just as we do with appointments in the office, we must obtain your consent to participate.  Your consent will be active for this visit and any virtual visit you may have with one of our providers in the next 365 days.    If you have a MyChart account, I can also send a copy of this consent to you electronically.  All virtual visits are billed to your insurance company just like a traditional visit in the office.  As this is a virtual visit, video technology does not allow for your provider to perform a traditional examination.  This may limit your provider's ability to fully assess your condition.  If your provider identifies any concerns that need to be evaluated in person or the need to arrange testing such as labs, EKG, etc, we will make arrangements to do so.    Although advances in technology are sophisticated, we cannot ensure that it will always work on either your end or our end.  If the connection with a video visit is poor, we may have to switch to a telephone visit.  With either a video or telephone visit, we are not always able to ensure that we have a secure connection.   I need to obtain your verbal consent now.   Are you willing to proceed with your visit today?   ROSELANI GRAJEDA has provided verbal consent on 07/07/2021 for a virtual visit (video or telephone).   Carroll Kinds, CMA 07/07/2021  10:37 AM

## 2021-07-07 NOTE — Progress Notes (Signed)
This service is provided via telemedicine  No vital signs collected/recorded due to the encounter was a telemedicine visit.   Location of patient (ex: home, work):  Home  Patient consents to a telephone visit:  yes, see encounter daed 07/07/2021  Location of the provider (ex: office, home) [facility]  Name of any referring provider:  N/A  Names of all persons participating in the telemedicine service and their role in the encounter:  Sherrie Mustache, Nurse Practitioner, Carroll Kinds, CMA, and patient.   Time spent on call:  11 minutes with medical assistant

## 2021-07-07 NOTE — Progress Notes (Signed)
Subjective:   Danielle Pierce is a 80 y.o. female who presents for Medicare Annual (Subsequent) preventive examination.  Review of Systems     Cardiac Risk Factors include: advanced age (>84men, >19 women);obesity (BMI >30kg/m2);hypertension;dyslipidemia     Objective:    There were no vitals filed for this visit. There is no height or weight on file to calculate BMI.  Advanced Directives 07/07/2021 06/21/2021 04/29/2021 12/16/2020 09/27/2020 08/19/2020 07/02/2020  Does Patient Have a Medical Advance Directive? Yes Yes Yes Yes Yes Yes Yes  Type of Paramedic of [place];Living will [place];Living will [place];Living will [place];Living will [place];Living will;Out of facility DNR (pink MOST or yellow form) [place];Out of facility DNR (pink MOST or yellow form) [place];Living will;Out of facility DNR (pink MOST or yellow form)  Does patient want to make changes to medical advance directive? No - Patient declined No - Patient declined No - Patient declined No - Patient declined No - Patient declined No - Patient declined No - Patient declined  Copy of [place] in Chart? No - copy requested No - copy requested No - copy requested No - copy requested No - copy requested No - copy requested No - copy requested  Would patient like information on creating a medical advance directive? - - - - - - -    Current Medications (verified) Outpatient Encounter Medications as of 07/07/2021  Medication Sig   aspirin 81 MG tablet Take 81 mg by mouth daily. Take 1 tablet once a day.   Calcium Carbonate-Vitamin D (CALCIUM-VITAMIN D) 500-200 MG-UNIT per tablet Take 1 tablet by mouth daily. Take 1 tablet twice a day to help bones.   Carboxymethylcell-Hypromellose 0.25-0.3 % GEL Apply 1 application to eye daily. Use 1 application to each eye as  needed to alleviate irritation.   Cholecalciferol (VITAMIN D-3 PO) Take 1 tablet by mouth 2 (two) times daily.   guaiFENesin-dextromethorphan (ROBITUSSIN DM) 100-10 MG/5ML syrup Take 5 mLs by mouth every 8 (eight) hours as needed for cough.   hydrocortisone (ANUSOL-HC) 25 MG suppository Place 1 suppository (25 mg total) rectally daily as needed for hemorrhoids or anal itching.   loratadine (CLARITIN) 10 MG tablet Take 1 tablet (10 mg total) by mouth daily for 14 days.   losartan-hydrochlorothiazide (HYZAAR) 50-12.5 MG tablet TAKE 1 TABLET ONCE DAILY.   TURMERIC PO Take 1 tablet by mouth daily at 12 noon. Also taking turmeric liquid supplement   vitamin B-12 (CYANOCOBALAMIN) 1000 MCG tablet Take 1,000 mcg by mouth daily.   vitamin C (ASCORBIC ACID) 500 MG tablet Take 500 mg by mouth daily.   vitamin E 400 UNIT capsule Take 400 Units by mouth daily.   zinc gluconate 50 MG tablet Take 1 tablet (50 mg total) by mouth daily for 14 days.   [DISCONTINUED] atorvastatin (LIPITOR) 10 MG tablet Take 1 tablet (10 mg total) by mouth at bedtime.   atorvastatin (LIPITOR) 10 MG tablet Take 1 tablet (10 mg total) by mouth at bedtime.   No facility-administered encounter medications on file as of 07/07/2021.    Allergies (verified) Patient has no known allergies.   History: Past Medical History:  Diagnosis Date   Arthritis    Ankle   Asymptomatic varicose veins    Benign neoplasm of colon    Bilateral shoulder pain 07/02/14   Cataract    Bilateral - just watching  Cervicitis and endocervicitis 09/13/2011   Disorder of bone and cartilage, unspecified    Disorders of bursae and tendons in shoulder region, unspecified    External hemorrhoids without mention of complication    Fibrocystic breast    Generalized osteoarthrosis, unspecified site    GERD (gastroesophageal reflux disease)    diet controlled, No meds   Hiatal hernia    Hypertension    Impacted cerumen 11/07/2011   Obesity, unspecified     Other diseases of nasal cavity and sinuses(478.19)    Pain in joint, ankle and foot    Pain in joint, pelvic region and thigh    Pain in joint, shoulder region    Reflux esophagitis    SVD (spontaneous vaginal delivery)    x 2   Unspecified essential hypertension    Unspecified vitamin D deficiency    Past Surgical History:  Procedure Laterality Date   COLONOSCOPY  2006   Dr.Orr   COLONOSCOPY  07/08/2013   Dr. Carlean Purl   FLEXIBLE SIGMOIDOSCOPY  1990   Hemorrhoids    MOUTH SURGERY  2005   DR LUTINS - tooth ext and gum surgery   SHOULDER ARTHROSCOPY W/ ACROMIAL REPAIR Right 07/02/14   Dr. Durward Fortes   UPPER GASTROINTESTINAL ENDOSCOPY     normal   Family History  Problem Relation Age of Onset   Hypertension Mother    Kidney disease Mother        Renal failure   Cancer Brother        Lymphoma   ADD / ADHD Son    Seizures Son    Hypertension Sister    Dementia Sister    Hypertension Sister    Hypertension Sister    Cancer - Other Sister    Early death Brother        Some type of accident   Colon cancer Father 11   Stomach cancer Neg Hx    Rectal cancer Neg Hx    Social History   Socioeconomic History   Marital status: Widowed    Spouse name: Not on file   Number of children: Not on file   Years of education: Not on file   Highest education level: Not on file  Occupational History   Occupation: retired Landscape architect  Tobacco Use   Smoking status: Never   Smokeless tobacco: Never  Vaping Use   Vaping Use: Never used  Substance and Sexual Activity   Alcohol use: Yes    Comment: occasional mixed drink    Drug use: No   Sexual activity: Not Currently    Birth control/protection: Post-menopausal  Other Topics Concern   Not on file  Social History Narrative   Married    Never smoked   Alcohlol none   Exercise treadmill 3 times a week    Social Determinants of Radio broadcast assistant Strain: Not on file  Food Insecurity: Not on file   Transportation Needs: Not on file  Physical Activity: Not on file  Stress: Not on file  Social Connections: Not on file    Tobacco Counseling Counseling given: Not Answered   Clinical Intake:  Pre-visit preparation completed: Yes  Pain : No/denies pain     BMI - recorded: 44.6 Diabetes: Yes     Diabetic?yes         Activities of Daily Living In your present state of health, do you have any difficulty performing the following activities: 07/07/2021  Hearing? N  Vision? N  Difficulty  concentrating or making decisions? N  Walking or climbing stairs? N  Dressing or bathing? N  Doing errands, shopping? N  Preparing Food and eating ? N  Using the Toilet? N  In the past six months, have you accidently leaked urine? N  Do you have problems with loss of bowel control? N  Managing your Medications? N  Managing your Finances? N  Housekeeping or managing your Housekeeping? N  Some recent data might be hidden    Patient Care Team: Lauree Chandler, NP as PCP - General (Geriatric Medicine) Garald Balding, MD as Consulting Physician (Orthopedic Surgery) Rutherford Guys, MD as Consulting Physician (Ophthalmology) Druscilla Brownie, MD as Consulting Physician (Dermatology) Gatha Mayer, MD as Consulting Physician (Gastroenterology)  Indicate any recent Medical Services you may have received from other than [facility] providers in the past year (date may be approximate).     Assessment:   This is a routine wellness examination for Shontay.  Hearing/Vision screen Hearing Screening - Comments:: Patient has no hearing problems Vision Screening - Comments:: Patient has no vision problems. Last eye exam was August 29,2022. Patient sees Dr. Gershon Crane  Dietary issues and exercise activities discussed: Current Exercise Habits: Home exercise routine, Type of exercise: walking, Time (Minutes): 20, Frequency (Times/Week): 3, Weekly Exercise (Minutes/Week): 60   Goals Addressed    None    Depression Screen PHQ 2/9 Scores 07/07/2021 08/19/2020 07/02/2020 04/19/2020 12/11/2019 06/24/2019 04/17/2019  PHQ - 2 Score 0 0 0 0 0 0 0    Fall Risk Fall Risk  07/07/2021 06/21/2021 04/29/2021 12/16/2020 09/27/2020  Falls in the past year? 0 0 0 0 0  Number falls in past yr: 0 0 0 0 0  Injury with Fall? 0 0 0 0 0  Risk for fall due to : No Fall Risks No Fall Risks No Fall Risks - -  Follow up Falls evaluation completed Falls evaluation completed Falls evaluation completed - -    FALL RISK PREVENTION PERTAINING TO THE HOME:  Any stairs in or around the home? Yes  If so, are there any without handrails? No  Home free of loose throw rugs in walkways, pet beds, electrical cords, etc? Yes  Adequate lighting in your home to reduce risk of falls? Yes   ASSISTIVE DEVICES UTILIZED TO PREVENT FALLS:  Life alert? No  Use of a cane, walker or w/c? No  Grab bars in the bathroom? Yes  Shower chair or bench in shower? No  Elevated toilet seat or a handicapped toilet? Yes   TIMED UP AND GO:  Was the test performed? No .   Cognitive Function: MMSE - Mini Mental State Exam 06/24/2019 06/13/2018 11/08/2016  Orientation to time 5 5 5   Orientation to Place 5 5 5   Registration 3 3 3   Attention/ Calculation 5 5 5   Recall 0 2 2  Language- name 2 objects 2 2 2   Language- repeat 1 1 1   Language- follow 3 step command 3 3 3   Language- read & follow direction 1 1 1   Write a sentence 1 1 1   Copy design 1 0 1  Total score 27 28 29      6CIT Screen 07/07/2021 07/02/2020  What Year? 0 points 0 points  What month? 0 points 0 points  What time? 0 points 0 points  Count back from 20 0 points 0 points  Months in reverse 0 points 0 points  Repeat phrase 0 points 2 points  Total Score 0 2  Immunizations Immunization History  Administered Date(s) Administered   Fluad Quad(high Dose 65+) 06/24/2019, 08/19/2020   Influenza, High Dose Seasonal PF 09/17/2017, 06/17/2018   PFIZER Comirnaty(Gray  Top)Covid-19 Tri-Sucrose Vaccine 01/31/2021   PFIZER(Purple Top)SARS-COV-2 Vaccination 11/08/2019, 11/29/2019, 07/30/2020   PPD Test 10/02/1997   Pneumococcal Conjugate-13 05/10/2017   Pneumococcal Polysaccharide-23 06/13/2018   Td 04/30/1998   Tdap 04/19/2020    TDAP status: Up to date  Flu Vaccine status: Due, Education has been provided regarding the importance of this vaccine. Advised may receive this vaccine at local pharmacy or Health Dept. Aware to provide a copy of the vaccination record if obtained from local pharmacy or Health Dept. Verbalized acceptance and understanding.  Pneumococcal vaccine status: Up to date  Covid-19 vaccine status: Information provided on how to obtain vaccines.   Qualifies for Shingles Vaccine? Yes   Zostavax completed No   Shingrix Completed?: No.    Education has been provided regarding the importance of this vaccine. Patient has been advised to call insurance company to determine out of pocket expense if they have not yet received this vaccine. Advised may also receive vaccine at local pharmacy or Health Dept. Verbalized acceptance and understanding.  Screening Tests Health Maintenance  Topic Date Due   Zoster Vaccines- Shingrix (1 of 2) Never done   OPHTHALMOLOGY EXAM  03/23/2021   FOOT EXAM  04/19/2021   HEMOGLOBIN A1C  10/26/2021   TETANUS/TDAP  04/19/2030   DEXA SCAN  Completed   COVID-19 Vaccine  Completed   Hepatitis C Screening  Completed   HPV VACCINES  Aged Out    Health Maintenance  Health Maintenance Due  Topic Date Due   Zoster Vaccines- Shingrix (1 of 2) Never done   OPHTHALMOLOGY EXAM  03/23/2021   FOOT EXAM  04/19/2021    Colorectal cancer screening: No longer required.   Mammogram status: No longer required due to age.  Bone Density status: Completed 04/18/21. Results reflect: Bone density results: OSTEOPENIA. Repeat every 2 years.  Lung Cancer Screening: (Low Dose CT Chest recommended if Age 40-80 years, 30  pack-year currently smoking OR have quit w/in 15years.) does not qualify.   Lung Cancer Screening Referral: na  Additional Screening:  Hepatitis C Screening: does qualify; Completed   Vision Screening: Recommended annual ophthalmology exams for early detection of glaucoma and other disorders of the eye. Is the patient up to date with their annual eye exam?  Yes  Who is the provider or what is the name of the office in which the patient attends annual eye exams? [facility] If pt is not established with a provider, would they like to be referred to a provider to establish care? No .   Dental Screening: Recommended annual dental exams for proper oral hygiene  Community Resource Referral / Chronic Care Management: CRR required this visit?  No   CCM required this visit?  No      Plan:     I have personally reviewed and noted the following in the patient's chart:   Medical and social history Use of alcohol, tobacco or illicit drugs  Current medications and supplements including opioid prescriptions.  Functional ability and status Nutritional status Physical activity Advanced directives List of other physicians Hospitalizations, surgeries, and ER visits in previous 12 months Vitals Screenings to include cognitive, depression, and falls Referrals and appointments  In addition, I have reviewed and discussed with patient certain preventive protocols, quality metrics, and best practice recommendations. A written personalized care plan for preventive services  as well as general preventive health recommendations were provided to patient.     Lauree Chandler, NP   07/07/2021    Virtual Visit via Telephone Note  I connected withNAME@ on 07/07/21 at 10:30 AM EDT by telephone and verified that I am speaking with the correct person using two identifiers.  Location: Patient: home  Provider: [facility]   I discussed the limitations, risks, security and privacy concerns of performing  an evaluation and management service by telephone and the availability of in person appointments. I also discussed with the patient that there may be a patient responsible charge related to this service. The patient expressed understanding and agreed to proceed.   I discussed the assessment and treatment plan with the patient. The patient was provided an opportunity to ask questions and all were answered. The patient agreed with the plan and demonstrated an understanding of the instructions.   The patient was advised to call back or seek an in-person evaluation if the symptoms worsen or if the condition fails to improve as anticipated.  I provided 16 minutes of non-face-to-face time during this encounter.  [employer]. Harle Battiest Avs printed and mailed

## 2021-07-07 NOTE — Patient Instructions (Signed)
Danielle Pierce , Thank you for taking time to come for your Medicare Wellness Visit. I appreciate your ongoing commitment to your health goals. Please review the following plan we discussed and let me know if I can assist you in the future.   Screening recommendations/referrals: Colonoscopy aged out Mammogram aged out Bone Density up to date Recommended yearly ophthalmology/optometry visit for glaucoma screening and checkup Recommended yearly dental visit for hygiene and checkup  Vaccinations: Influenza vaccine recommended at this time Pneumococcal vaccine up to date Tdap vaccine up to date Shingles vaccine RECOMMENDED to get at local pharmacy    COVID booster- recommended to have a loca pharmacy   Advanced directives: bring to next appt.   Conditions/risks identified: obesity, advance age, hypertension, hyperlipidemia  Next appointment: yearly for awv   Preventive Care 79 Years and Older, Female Preventive care refers to lifestyle choices and visits with your health care provider that can promote health and wellness. What does preventive care include? A yearly physical exam. This is also called an annual well check. Dental exams once or twice a year. Routine eye exams. Ask your health care provider how often you should have your eyes checked. Personal lifestyle choices, including: Daily care of your teeth and gums. Regular physical activity. Eating a healthy diet. Avoiding tobacco and drug use. Limiting alcohol use. Practicing safe sex. Taking low-dose aspirin every day. Taking vitamin and mineral supplements as recommended by your health care provider. What happens during an annual well check? The services and screenings done by your health care provider during your annual well check will depend on your age, overall health, lifestyle risk factors, and family history of disease. Counseling  Your health care provider may ask you questions about your: Alcohol use. Tobacco  use. Drug use. Emotional well-being. Home and relationship well-being. Sexual activity. Eating habits. History of falls. Memory and ability to understand (cognition). Work and work Statistician. Reproductive health. Screening  You may have the following tests or measurements: Height, weight, and BMI. Blood pressure. Lipid and cholesterol levels. These may be checked every 5 years, or more frequently if you are over 50 years old. Skin check. Lung cancer screening. You may have this screening every year starting at age 83 if you have a 30-pack-year history of smoking and currently smoke or have quit within the past 15 years. Fecal occult blood test (FOBT) of the stool. You may have this test every year starting at age 59. Flexible sigmoidoscopy or colonoscopy. You may have a sigmoidoscopy every 5 years or a colonoscopy every 10 years starting at age 73. Hepatitis C blood test. Hepatitis B blood test. Sexually transmitted disease (STD) testing. Diabetes screening. This is done by checking your blood sugar (glucose) after you have not eaten for a while (fasting). You may have this done every 1-3 years. Bone density scan. This is done to screen for osteoporosis. You may have this done starting at age 89. Mammogram. This may be done every 1-2 years. Talk to your health care provider about how often you should have regular mammograms. Talk with your health care provider about your test results, treatment options, and if necessary, the need for more tests. Vaccines  Your health care provider may recommend certain vaccines, such as: Influenza vaccine. This is recommended every year. Tetanus, diphtheria, and acellular pertussis (Tdap, Td) vaccine. You may need a Td booster every 10 years. Zoster vaccine. You may need this after age 72. Pneumococcal 13-valent conjugate (PCV13) vaccine. One dose is recommended after  age 45. Pneumococcal polysaccharide (PPSV23) vaccine. One dose is recommended  after age 60. Talk to your health care provider about which screenings and vaccines you need and how often you need them. This information is not intended to replace advice given to you by your health care provider. Make sure you discuss any questions you have with your health care provider. Document Released: 10/15/2015 Document Revised: 06/07/2016 Document Reviewed: 07/20/2015 Elsevier Interactive Patient Education  2017 [place] Prevention in the Home Falls can cause injuries. They can happen to people of all ages. There are many things you can do to make your home safe and to help prevent falls. What can I do on the outside of my home? Regularly fix the edges of walkways and driveways and fix any cracks. Remove anything that might make you trip as you walk through a door, such as a raised step or threshold. Trim any bushes or trees on the path to your home. Use bright outdoor lighting. Clear any walking paths of anything that might make someone trip, such as rocks or tools. Regularly check to see if handrails are loose or broken. Make sure that both sides of any steps have handrails. Any raised decks and porches should have guardrails on the edges. Have any leaves, snow, or ice cleared regularly. Use sand or salt on walking paths during winter. Clean up any spills in your garage right away. This includes oil or grease spills. What can I do in the bathroom? Use night lights. Install grab bars by the toilet and in the tub and shower. Do not use towel bars as grab bars. Use non-skid mats or decals in the tub or shower. If you need to sit down in the shower, use a plastic, non-slip stool. Keep the floor dry. Clean up any water that spills on the floor as soon as it happens. Remove soap buildup in the tub or shower regularly. Attach bath mats securely with double-sided non-slip rug tape. Do not have throw rugs and other things on the floor that can make you trip. What can I do  in the bedroom? Use night lights. Make sure that you have a light by your bed that is easy to reach. Do not use any sheets or blankets that are too big for your bed. They should not hang down onto the floor. Have a firm chair that has side arms. You can use this for support while you get dressed. Do not have throw rugs and other things on the floor that can make you trip. What can I do in the kitchen? Clean up any spills right away. Avoid walking on wet floors. Keep items that you use a lot in easy-to-reach places. If you need to reach something above you, use a strong step stool that has a grab bar. Keep electrical cords out of the way. Do not use floor polish or wax that makes floors slippery. If you must use wax, use non-skid floor wax. Do not have throw rugs and other things on the floor that can make you trip. What can I do with my stairs? Do not leave any items on the stairs. Make sure that there are handrails on both sides of the stairs and use them. Fix handrails that are broken or loose. Make sure that handrails are as long as the stairways. Check any carpeting to make sure that it is firmly attached to the stairs. Fix any carpet that is loose or worn. Avoid having throw rugs  at the top or bottom of the stairs. If you do have throw rugs, attach them to the floor with carpet tape. Make sure that you have a light switch at the top of the stairs and the bottom of the stairs. If you do not have them, ask someone to add them for you. What else can I do to help prevent falls? Wear shoes that: Do not have high heels. Have rubber bottoms. Are comfortable and fit you well. Are closed at the toe. Do not wear sandals. If you use a stepladder: Make sure that it is fully opened. Do not climb a closed stepladder. Make sure that both sides of the stepladder are locked into place. Ask someone to hold it for you, if possible. Clearly mark and make sure that you can see: Any grab bars or  handrails. First and last steps. Where the edge of each step is. Use tools that help you move around (mobility aids) if they are needed. These include: Canes. Walkers. Scooters. Crutches. Turn on the lights when you go into a dark area. Replace any light bulbs as soon as they burn out. Set up your furniture so you have a clear path. Avoid moving your furniture around. If any of your floors are uneven, fix them. If there are any pets around you, be aware of where they are. Review your medicines with your doctor. Some medicines can make you feel dizzy. This can increase your chance of falling. Ask your doctor what other things that you can do to help prevent falls. This information is not intended to replace advice given to you by your health care provider. Make sure you discuss any questions you have with your health care provider. Document Released: 07/15/2009 Document Revised: 02/24/2016 Document Reviewed: 10/23/2014 Elsevier Interactive Patient Education  2017 [employer].

## 2021-07-08 ENCOUNTER — Encounter: Payer: Medicare PPO | Admitting: Family

## 2021-07-26 ENCOUNTER — Other Ambulatory Visit: Payer: Medicare PPO

## 2021-07-26 DIAGNOSIS — E785 Hyperlipidemia, unspecified: Secondary | ICD-10-CM

## 2021-07-26 LAB — COMPLETE METABOLIC PANEL WITH GFR
AG Ratio: 2 (calc) (ref 1.0–2.5)
ALT: 13 U/L (ref 6–29)
AST: 22 U/L (ref 10–35)
Albumin: 4.1 g/dL (ref 3.6–5.1)
Alkaline phosphatase (APISO): 93 U/L (ref 37–153)
BUN/Creatinine Ratio: 12 (calc) (ref 6–22)
BUN: 13 mg/dL (ref 7–25)
CO2: 26 mmol/L (ref 20–32)
Calcium: 9.6 mg/dL (ref 8.6–10.4)
Chloride: 105 mmol/L (ref 98–110)
Creat: 1.09 mg/dL — ABNORMAL HIGH (ref 0.60–1.00)
Globulin: 2.1 g/dL (calc) (ref 1.9–3.7)
Glucose, Bld: 97 mg/dL (ref 65–99)
Potassium: 3.7 mmol/L (ref 3.5–5.3)
Sodium: 141 mmol/L (ref 135–146)
Total Bilirubin: 0.7 mg/dL (ref 0.2–1.2)
Total Protein: 6.2 g/dL (ref 6.1–8.1)
eGFR: 52 mL/min/{1.73_m2} — ABNORMAL LOW (ref >=60)

## 2021-07-26 LAB — LIPID PANEL
Cholesterol: 156 mg/dL (ref <200)
HDL: 54 mg/dL (ref >=50)
LDL Cholesterol (Calc): 88 mg/dL (calc)
Non-HDL Cholesterol (Calc): 102 mg/dL (calc) (ref <130)
Total CHOL/HDL Ratio: 2.9 (calc) (ref <5.0)
Triglycerides: 58 mg/dL (ref <150)

## 2021-07-29 ENCOUNTER — Ambulatory Visit: Payer: Medicare PPO | Admitting: Nurse Practitioner

## 2021-07-29 ENCOUNTER — Other Ambulatory Visit: Payer: Self-pay

## 2021-07-29 ENCOUNTER — Encounter: Payer: Self-pay | Admitting: Nurse Practitioner

## 2021-07-29 VITALS — BP 130/80 | HR 74 | Temp 97.5°F | Ht 63.0 in | Wt 244.0 lb

## 2021-07-29 DIAGNOSIS — K644 Residual hemorrhoidal skin tags: Secondary | ICD-10-CM

## 2021-07-29 DIAGNOSIS — E1122 Type 2 diabetes mellitus with diabetic chronic kidney disease: Secondary | ICD-10-CM | POA: Diagnosis not present

## 2021-07-29 DIAGNOSIS — M79672 Pain in left foot: Secondary | ICD-10-CM

## 2021-07-29 DIAGNOSIS — Z23 Encounter for immunization: Secondary | ICD-10-CM | POA: Diagnosis not present

## 2021-07-29 DIAGNOSIS — N181 Chronic kidney disease, stage 1: Secondary | ICD-10-CM

## 2021-07-29 DIAGNOSIS — I10 Essential (primary) hypertension: Secondary | ICD-10-CM | POA: Diagnosis not present

## 2021-07-29 DIAGNOSIS — K648 Other hemorrhoids: Secondary | ICD-10-CM

## 2021-07-29 DIAGNOSIS — E785 Hyperlipidemia, unspecified: Secondary | ICD-10-CM

## 2021-07-29 MED ORDER — LOSARTAN POTASSIUM-HCTZ 50-12.5 MG PO TABS: 1.0000 | ORAL_TABLET | Freq: Every day | ORAL | 1 refills | Status: DC

## 2021-07-29 MED ORDER — ATORVASTATIN CALCIUM 10 MG PO TABS: 10.0000 mg | ORAL_TABLET | Freq: Every day | ORAL | 1 refills | Status: DC

## 2021-07-29 NOTE — Progress Notes (Signed)
Careteam: Patient Care Team: Sharon Seller, NP as PCP - General (Geriatric Medicine) Valeria Batman, MD as Consulting Physician (Orthopedic Surgery) Jethro Bolus, MD as Consulting Physician (Ophthalmology) Cherlyn Roberts, MD as Consulting Physician (Dermatology) Iva Boop, MD as Consulting Physician (Gastroenterology)  PLACE OF SERVICE:  [facility]  Advanced Directive information Does Patient Have a Medical Advance Directive?: Yes, Type of Advance Directive: Healthcare Power of [place];Living will, Does patient want to make changes to medical advance directive?: No - Patient declined  No Known Allergies  Chief Complaint  Patient presents with   Medical Management of Chronic Issues    3 month follow-up with EKG and discuss labs. Foot exam today. Discuss need for eye exam and shingrix or exclude. Flu vaccine today. Discuss blood in bowels x 2 episodes at night.      HPI: Patient is a 80 y.o. female for routine follow up.   Earlier this week had blood from her hemorrhoids but starting using suppository medication and has not had any since. No constipation or diarrhea.   Hyperlipidemia- started lipitor 10 mg after last visit. LDL 88, no side effects noted.   Has heel pain after she walks at time. Plans to discuss more with orthopedic   DM-diet controlled, up to date on ophthalmology exam.   Htn-controlled on losartan-hctz   Review of Systems:  Review of Systems  Constitutional:  Negative for chills, fever and weight loss.  HENT:  Negative for tinnitus.   Respiratory:  Negative for cough, sputum production and shortness of breath.   Cardiovascular:  Negative for chest pain, palpitations and leg swelling.  Gastrointestinal:  Negative for abdominal pain, constipation, diarrhea and heartburn.  Genitourinary:  Negative for dysuria, frequency and urgency.  Musculoskeletal:  Negative for back pain, falls, joint pain and myalgias.  Skin: Negative.    Neurological:  Negative for dizziness and headaches.  Psychiatric/Behavioral:  Negative for depression and memory loss. The patient does not have insomnia.    Past Medical History:  Diagnosis Date   Arthritis    Ankle   Asymptomatic varicose veins    Benign neoplasm of colon    Bilateral shoulder pain 07/02/14   Cataract    Bilateral - just watching   Cervicitis and endocervicitis 09/13/2011   Disorder of bone and cartilage, unspecified    Disorders of bursae and tendons in shoulder region, unspecified    External hemorrhoids without mention of complication    Fibrocystic breast    Generalized osteoarthrosis, unspecified site    GERD (gastroesophageal reflux disease)    diet controlled, No meds   Hiatal hernia    Hypertension    Impacted cerumen 11/07/2011   Obesity, unspecified    Other diseases of nasal cavity and sinuses(478.19)    Pain in joint, ankle and foot    Pain in joint, pelvic region and thigh    Pain in joint, shoulder region    Reflux esophagitis    SVD (spontaneous vaginal delivery)    x 2   Unspecified essential hypertension    Unspecified vitamin D deficiency    Past Surgical History:  Procedure Laterality Date   COLONOSCOPY  2006   Dr.Orr   COLONOSCOPY  07/08/2013   Dr. Leone Payor   FLEXIBLE SIGMOIDOSCOPY  1990   Hemorrhoids    MOUTH SURGERY  2005   DR LUTINS - tooth ext and gum surgery   SHOULDER ARTHROSCOPY W/ ACROMIAL REPAIR Right 07/02/14   Dr. Cleophas Dunker   UPPER GASTROINTESTINAL  ENDOSCOPY     normal   Social History:   reports that she has never smoked. She has never used smokeless tobacco. She reports current alcohol use. She reports that she does not use drugs.  Family History  Problem Relation Age of Onset   Hypertension Mother    Kidney disease Mother        Renal failure   Cancer Brother        Lymphoma   ADD / ADHD Son    Seizures Son    Hypertension Sister    Dementia Sister    Hypertension Sister    Hypertension Sister     Cancer - Other Sister    Early death Brother        Some type of accident   Colon cancer Father 50   Stomach cancer Neg Hx    Rectal cancer Neg Hx     Medications: Patient's Medications  New Prescriptions   No medications on file  Previous Medications   ASPIRIN 81 MG TABLET    Take 81 mg by mouth daily.   ATORVASTATIN (LIPITOR) 10 MG TABLET    Take 1 tablet (10 mg total) by mouth at bedtime.   CALCIUM CARBONATE-VITAMIN D (CALCIUM-VITAMIN D) 500-200 MG-UNIT PER TABLET    Take 1 tablet by mouth daily.   CARBOXYMETHYLCELL-HYPROMELLOSE 0.25-0.3 % GEL    Apply 1 application to eye daily. to alleviate irritation.   CHOLECALCIFEROL (VITAMIN D-3 PO)    Take 1 tablet by mouth 2 (two) times daily.   HYDROCORTISONE (ANUSOL-HC) 25 MG SUPPOSITORY    Place 1 suppository (25 mg total) rectally daily as needed for hemorrhoids or anal itching.   LOSARTAN-HYDROCHLOROTHIAZIDE (HYZAAR) 50-12.5 MG TABLET    TAKE 1 TABLET ONCE DAILY.   TURMERIC PO    Take 1 tablet by mouth as needed. Also taking turmeric liquid supplement   VITAMIN B-12 (CYANOCOBALAMIN) 1000 MCG TABLET    Take 1,000 mcg by mouth daily.   VITAMIN C (ASCORBIC ACID) 500 MG TABLET    Take 500 mg by mouth daily.   VITAMIN E 400 UNIT CAPSULE    Take 400 Units by mouth daily.  Modified Medications   No medications on file  Discontinued Medications   GUAIFENESIN-DEXTROMETHORPHAN (ROBITUSSIN DM) 100-10 MG/5ML SYRUP    Take 5 mLs by mouth every 8 (eight) hours as needed for cough.   LORATADINE (CLARITIN) 10 MG TABLET    Take 1 tablet (10 mg total) by mouth daily for 14 days.    Physical Exam:  Vitals:   07/29/21 1032  BP: 130/80  Pulse: 74  Temp: (!) 97.5 F (36.4 C)  TempSrc: Temporal  SpO2: 98%  Weight: 244 lb (110.7 kg)  Height: $Remove'5\' 3"'AFZRocp$  (1.6 m)   Body mass index is 43.22 kg/m. Wt Readings from Last 3 Encounters:  07/29/21 244 lb (110.7 kg)  06/21/21 251 lb (113.9 kg)  05/03/21 245 lb (111.1 kg)    Physical Exam Constitutional:       General: She is not in acute distress.    Appearance: She is well-developed. She is not diaphoretic.  HENT:     Head: Normocephalic and atraumatic.     Mouth/Throat:     Pharynx: No oropharyngeal exudate.  Eyes:     Conjunctiva/sclera: Conjunctivae normal.     Pupils: Pupils are equal, round, and reactive to light.  Cardiovascular:     Rate and Rhythm: Normal rate and regular rhythm.     Heart sounds: Normal  heart sounds.  Pulmonary:     Effort: Pulmonary effort is normal.     Breath sounds: Normal breath sounds.  Abdominal:     General: Bowel sounds are normal.     Palpations: Abdomen is soft.  Musculoskeletal:     Cervical back: Normal range of motion and neck supple.     Right lower leg: No edema.     Left lower leg: No edema.  Skin:    General: Skin is warm and dry.  Neurological:     Mental Status: She is alert.  Psychiatric:        Mood and Affect: Mood normal.    Labs reviewed: Basic Metabolic Panel: Recent Labs    08/17/20 0905 12/13/20 0856 07/26/21 0912  NA 143 142 141  K 3.7 3.8 3.7  CL 108 107 105  CO2 $Re'23 25 26  'UMN$ GLUCOSE 102* 112* 97  BUN $Re'15 14 13  'LSW$ CREATININE 1.09* 1.01* 1.09*  CALCIUM 9.2 9.6 9.6   Liver Function Tests: Recent Labs    12/13/20 0856 07/26/21 0912  AST 22 22  ALT 18 13  BILITOT 0.7 0.7  PROT 6.4 6.2   No results for input(s): LIPASE, AMYLASE in the last 8760 hours. No results for input(s): AMMONIA in the last 8760 hours. CBC: Recent Labs    12/13/20 0856  WBC 7.8  NEUTROABS 3,635  HGB 13.7  HCT 40.8  MCV 93.2  PLT 222   Lipid Panel: Recent Labs    12/13/20 0856 04/25/21 0913 07/26/21 0912  CHOL 218* 223* 156  HDL 58 62 54  LDLCALC 140* 142* 88  TRIG 94 85 58  CHOLHDL 3.8 3.6 2.9   TSH: No results for input(s): TSH in the last 8760 hours. A1C: Lab Results  Component Value Date   HGBA1C 6.0 (H) 04/25/2021     Assessment/Plan 1. Essential hypertension -Blood pressure well controlled Continue  current medications Recheck metabolic panel - EKG 72-CNOB- normal sinus, rate 72 - CMP with eGFR([facility]); Future - CBC with Differential/Platelet; Future  2. Need for influenza vaccination - Flu Vaccine QUAD High Dose(Fluad)  3. Type 2 diabetes mellitus with stage 1 chronic kidney disease, without long-term current use of insulin (HCC) -diet controlled, encouraged dietary compliance, routine foot care/monitoring and to keep up with diabetic eye exams through ophthalmology  - Flu Vaccine QUAD High Dose(Fluad) - Hemoglobin A1c; Future  4. Morbid obesity ([place]) --education provided on healthy weight loss through increase in physical activity and proper nutrition   5. Internal and external bleeding hemorrhoids -stable at this time.   6. Hyperlipidemia, unspecified hyperlipidemia type -continue dietary modifications with lipitor. LDL has significantly improved since being on statin. - CMP with eGFR([facility]); Future - Lipid Panel; Future - atorvastatin (LIPITOR) 10 MG tablet; Take 1 tablet (10 mg total) by mouth at bedtime.  Dispense: 90 tablet; Refill: 1  7. Pain of left heel -encouraged to get supportive shoes and insert if needed     Next appt: 4 months, labs prior  Makyra Corprew K. [place], [facility] [phone]

## 2021-11-06 ENCOUNTER — Other Ambulatory Visit: Payer: Self-pay

## 2021-11-06 ENCOUNTER — Encounter (HOSPITAL_COMMUNITY): Payer: Self-pay

## 2021-11-06 ENCOUNTER — Ambulatory Visit (HOSPITAL_COMMUNITY)
Admission: EM | Admit: 2021-11-06 | Discharge: 2021-11-06 | Disposition: A | Discharge status: Home / Self Care | Payer: Medicare PPO

## 2021-11-06 DIAGNOSIS — I10 Essential (primary) hypertension: Secondary | ICD-10-CM | POA: Diagnosis not present

## 2021-11-06 NOTE — ED Provider Notes (Addendum)
[place]    CSN: [id number] Arrival date & time: 11/06/21  1717      History   Chief Complaint Chief Complaint  Patient presents with   Hypertension    HPI Danielle Pierce is a 81 y.o. female.   HPI  HTN: Patient states that she has a history of high blood pressure.  Normally this is well controlled.  She does report that she missed her medication 2 days over the past few days and has also had more stress, more salt and has not slept as well.  She also reports that she is not exercising like she used to.  She reports that she was at a church event and checked her blood pressure and it was about 268 systolic.  This was Saturday.  She reports that she rechecked it today at [facility] over her close and it was 199/125.  This scared her so she came to our office.  She denies any chest pain, shortness of breath, peripheral edema, headache or visual changes.  Past Medical History:  Diagnosis Date   Arthritis    Ankle   Asymptomatic varicose veins    Benign neoplasm of colon    Bilateral shoulder pain 07/02/14   Cataract    Bilateral - just watching   Cervicitis and endocervicitis 09/13/2011   Disorder of bone and cartilage, unspecified    Disorders of bursae and tendons in shoulder region, unspecified    External hemorrhoids without mention of complication    Fibrocystic breast    Generalized osteoarthrosis, unspecified site    GERD (gastroesophageal reflux disease)    diet controlled, No meds   Hiatal hernia    Hypertension    Impacted cerumen 11/07/2011   Obesity, unspecified    Other diseases of nasal cavity and sinuses(478.19)    Pain in joint, ankle and foot    Pain in joint, pelvic region and thigh    Pain in joint, shoulder region    Reflux esophagitis    SVD (spontaneous vaginal delivery)    x 2   Unspecified essential hypertension    Unspecified vitamin D deficiency     Patient Active Problem List   Diagnosis Date Noted   Flatulence/gas  pain/belching 06/21/2021   Upper back pain 06/21/2021   Heel pain 05/03/2021   Trigger thumb, right thumb 07/27/2020   Body mass index (BMI) of 40.1-44.9 in adult ([place]) 11/14/2018   Localized osteoarthritis of left knee 05/10/2017   Female perineal bleeding 11/08/2016   HLD (hyperlipidemia) 01/19/2016   Fasciitis 12/15/2015   Corn 05/18/2015   Excessive cerumen in both ear canals 05/18/2015   Age spots 04/08/2014   Blepharochalasis of right eye 04/08/2014   Lumbago 04/08/2014   Personal history of colonic adenomas 07/08/2013   Family history of colon cancer- father in 70s 07/08/2013   Internal and external bleeding hemorrhoids 07/08/2013   DM (diabetes mellitus), type 2 with renal complications ([place]) 34/19/6222   Essential hypertension 02/20/2013   Obesity    Vitamin D deficiency     Past Surgical History:  Procedure Laterality Date   COLONOSCOPY  2006   Dr.Orr   COLONOSCOPY  07/08/2013   Dr. Carlean Purl   FLEXIBLE SIGMOIDOSCOPY  1990   Hemorrhoids    MOUTH SURGERY  2005   DR LUTINS - tooth ext and gum surgery   SHOULDER ARTHROSCOPY W/ ACROMIAL REPAIR Right 07/02/14   Dr. Durward Fortes   UPPER GASTROINTESTINAL ENDOSCOPY     normal  OB History   No obstetric history on file.      Home Medications    Prior to Admission medications   Medication Sig Start Date End Date Taking? Authorizing Provider  aspirin 81 MG tablet Take 81 mg by mouth daily.    [provider]  atorvastatin (LIPITOR) 10 MG tablet Take 1 tablet (10 mg total) by mouth at bedtime. 07/29/21   Lauree Chandler, NP  Calcium Carbonate-Vitamin D (CALCIUM-VITAMIN D) 500-200 MG-UNIT per tablet Take 1 tablet by mouth daily.    [provider]  Carboxymethylcell-Hypromellose 0.25-0.3 % GEL Apply 1 application to eye daily. to alleviate irritation.    [provider]  Cholecalciferol (VITAMIN D-3 PO) Take 1 tablet by mouth 2 (two) times daily.    [provider]  hydrocortisone  (ANUSOL-HC) 25 MG suppository Place 1 suppository (25 mg total) rectally daily as needed for hemorrhoids or anal itching. 06/17/21   Lauree Chandler, NP  losartan-hydrochlorothiazide (HYZAAR) 50-12.5 MG tablet Take 1 tablet by mouth daily. 07/29/21   Lauree Chandler, NP  TURMERIC PO Take 1 tablet by mouth as needed. Also taking turmeric liquid supplement    [provider]  vitamin B-12 (CYANOCOBALAMIN) 1000 MCG tablet Take 1,000 mcg by mouth daily.    [provider]  vitamin C (ASCORBIC ACID) 500 MG tablet Take 500 mg by mouth daily.    [provider]  vitamin E 400 UNIT capsule Take 400 Units by mouth daily.    [provider]    Family History Family History  Problem Relation Age of Onset   Hypertension Mother    Kidney disease Mother        Renal failure   Cancer Brother        Lymphoma   ADD / ADHD Son    Seizures Son    Hypertension Sister    Dementia Sister    Hypertension Sister    Hypertension Sister    Cancer - Other Sister    Early death Brother        Some type of accident   Colon cancer Father 55   Stomach cancer Neg Hx    Rectal cancer Neg Hx     Social History Social History   Tobacco Use   Smoking status: Never   Smokeless tobacco: Never  Vaping Use   Vaping Use: Never used  Substance Use Topics   Alcohol use: Yes    Comment: occasional mixed drink    Drug use: No     Allergies   Patient has no known allergies.   Review of Systems Review of Systems  As stated above in HPI Physical Exam Triage Vital Signs ED Triage Vitals  Enc Vitals Group     BP 11/06/21 1737 (!) 151/89     Pulse Rate 11/06/21 1737 74     Resp 11/06/21 1737 17     Temp 11/06/21 1737 98.6 F (37 C)     Temp Source 11/06/21 1737 Oral     SpO2 11/06/21 1737 99 %     Weight --      Height --      Head Circumference --      Peak Flow --      Pain Score 11/06/21 1736 0     Pain Loc --      Pain Edu? --      Excl. in [place]? --     No data found.  Updated Vital Signs  BP (!) 151/89 (BP Location: Right Arm)    Pulse 74    Temp 98.6 F (37 C) (Oral)    Resp 17    SpO2 99%   Physical Exam Vitals and nursing note reviewed.  Constitutional:      General: She is not in acute distress.    Appearance: Normal appearance. She is obese. She is not ill-appearing, toxic-appearing or diaphoretic.  HENT:     Head: Normocephalic and atraumatic.     Mouth/Throat:     Mouth: Mucous membranes are moist.  Eyes:     Extraocular Movements: Extraocular movements intact.     Pupils: Pupils are equal, round, and reactive to light.  Neck:     Vascular: No carotid bruit.  Cardiovascular:     Rate and Rhythm: Normal rate and regular rhythm.     Heart sounds: Normal heart sounds.  Pulmonary:     Effort: Pulmonary effort is normal.     Breath sounds: Normal breath sounds.  Musculoskeletal:     Cervical back: Normal range of motion and neck supple.     Right lower leg: No edema.     Left lower leg: No edema.  Lymphadenopathy:     Cervical: No cervical adenopathy.  Skin:    General: Skin is warm.     Coloration: Skin is not jaundiced or pale.  Neurological:     Mental Status: She is alert.     UC Treatments / Results  Labs (all labs ordered are listed, but only abnormal results are displayed) Labs Reviewed - No data to display  EKG   Radiology No results found.  Procedures Procedures (including critical care time)  Medications Ordered in UC Medications - No data to display  Initial Impression / Assessment and Plan / UC Course  I have reviewed the triage vital signs and the nursing notes.  Pertinent labs & imaging results that were available during my care of the patient were reviewed by me and considered in my medical decision making (see chart for details).     New.  Blood pressure is slightly elevated but significantly better than she recorded at [facility]  Discussed proper cuff use and recommendation for  continued monitoring.  She will try to increase her hydration with water, take her medication as directed, significantly decrease her salt intake, restart her walking, try to reduce her stress level and try to get better quality sleep.  We discussed red flag signs and symptoms.  She will follow-up with her PCP within the next 2 weeks. Final Clinical Impressions(s) / UC Diagnoses   Final diagnoses:  None   Discharge Instructions   None    ED Prescriptions   None    PDMP not reviewed this encounter.   Hughie Closs, PA-C 11/06/21 1847    Hughie Closs, PA-C 11/06/21 Valerie Roys

## 2021-11-06 NOTE — ED Triage Notes (Signed)
Pt presents with elevated blood pressure today.

## 2021-11-10 ENCOUNTER — Ambulatory Visit (INDEPENDENT_AMBULATORY_CARE_PROVIDER_SITE_OTHER): Payer: Medicare PPO | Admitting: Family

## 2021-11-10 ENCOUNTER — Encounter: Payer: Self-pay | Admitting: Family

## 2021-11-10 ENCOUNTER — Other Ambulatory Visit: Payer: Self-pay

## 2021-11-10 VITALS — BP 138/82 | HR 75 | Temp 96.3°F | Resp 16 | Ht 63.0 in | Wt 248.0 lb

## 2021-11-10 DIAGNOSIS — I10 Essential (primary) hypertension: Secondary | ICD-10-CM | POA: Diagnosis not present

## 2021-11-10 NOTE — Patient Instructions (Addendum)
-   check Blood pressure at home and record on log provided and notify provider if B/p > 140/90   - Please bring blood pressure log to visit for evaluation

## 2021-11-10 NOTE — Progress Notes (Signed)
Provider: Joselinne Lawal FNP-C  Lauree Chandler, NP  Patient Care Team: Lauree Chandler, NP as PCP - General (Geriatric Medicine) Garald Balding, MD as Consulting Physician (Orthopedic Surgery) Rutherford Guys, MD as Consulting Physician (Ophthalmology) Druscilla Brownie, MD as Consulting Physician (Dermatology) Gatha Mayer, MD as Consulting Physician (Gastroenterology)  Extended Emergency Contact Information Primary Emergency Contact: Criado,Luther Address: [address]          [place], [place] [address] [place] Phone: [phone] Mobile Phone: [phone] Relation: Spouse Secondary Emergency Contact: [place] Phone: [phone] Mobile Phone: [phone] Relation: Daughter  Code Status:  DNR Goals of care: Advanced Directive information Advanced Directives 11/10/2021  Does Patient Have a Medical Advance Directive? No  Type of Advance Directive -  Does patient want to make changes to medical advance directive? -  Copy of [place] in Chart? -  Would patient like information on creating a medical advance directive? No - Patient declined     Chief Complaint  Patient presents with   Acute Visit    Patient complains of elevated BP reading at [facility].    HPI:  Pt is a 81 y.o. female seen today for an acute visit for evaluation of high blood pressure.states was in a women's health program when she got her blood pressure checked was high.she had blood pressure checked in  [facility] 199/120 so she went Urgent care SBP 158 then recheck was 148  States had missed her blood pressure x 2 days.denies any headache,dizziness,vision changes,fatigue,chest tightness,palpitation,chest pain or shortness of breath.      Past Medical History:  Diagnosis Date   Arthritis    Ankle   Asymptomatic varicose veins    Benign neoplasm of colon    Bilateral shoulder pain 07/02/14   Cataract     Bilateral - just watching   Cervicitis and endocervicitis 09/13/2011   Disorder of bone and cartilage, unspecified    Disorders of bursae and tendons in shoulder region, unspecified    External hemorrhoids without mention of complication    Fibrocystic breast    Generalized osteoarthrosis, unspecified site    GERD (gastroesophageal reflux disease)    diet controlled, No meds   Hiatal hernia    Hypertension    Impacted cerumen 11/07/2011   Obesity, unspecified    Other diseases of nasal cavity and sinuses(478.19)    Pain in joint, ankle and foot    Pain in joint, pelvic region and thigh    Pain in joint, shoulder region    Reflux esophagitis    SVD (spontaneous vaginal delivery)    x 2   Unspecified essential hypertension    Unspecified vitamin D deficiency    Past Surgical History:  Procedure Laterality Date   COLONOSCOPY  2006   Dr.Orr   COLONOSCOPY  07/08/2013   Dr. Carlean Purl   FLEXIBLE SIGMOIDOSCOPY  1990   Hemorrhoids    MOUTH SURGERY  2005   DR LUTINS - tooth ext and gum surgery   SHOULDER ARTHROSCOPY W/ ACROMIAL REPAIR Right 07/02/14   Dr. Durward Fortes   UPPER GASTROINTESTINAL ENDOSCOPY     normal    No Known Allergies  Outpatient Encounter Medications as of 11/10/2021  Medication Sig   aspirin 81 MG tablet Take 81 mg by mouth daily.   atorvastatin (LIPITOR) 10 MG tablet Take 1 tablet (10 mg total) by mouth at bedtime.   Calcium Carbonate-Vitamin D (CALCIUM-VITAMIN D) 500-200 MG-UNIT per  tablet Take 1 tablet by mouth daily.   Carboxymethylcell-Hypromellose 0.25-0.3 % GEL Apply 1 application to eye daily. to alleviate irritation.   Cholecalciferol (VITAMIN D-3 PO) Take 1 tablet by mouth 2 (two) times daily.   hydrocortisone (ANUSOL-HC) 25 MG suppository Place 1 suppository (25 mg total) rectally daily as needed for hemorrhoids or anal itching.   losartan-hydrochlorothiazide (HYZAAR) 50-12.5 MG tablet Take 1 tablet by mouth daily.   TURMERIC PO Take 1 tablet by mouth as  needed. Also taking turmeric liquid supplement   vitamin B-12 (CYANOCOBALAMIN) 1000 MCG tablet Take 1,000 mcg by mouth daily.   vitamin C (ASCORBIC ACID) 500 MG tablet Take 500 mg by mouth daily.   vitamin E 400 UNIT capsule Take 400 Units by mouth daily.   No facility-administered encounter medications on file as of 11/10/2021.    Review of Systems  Constitutional:  Negative for appetite change, chills, fatigue, fever and unexpected weight change.  HENT:  Negative for congestion, dental problem, ear discharge, ear pain, facial swelling, hearing loss, nosebleeds, postnasal drip, rhinorrhea, sinus pressure, sinus pain, sneezing, sore throat, tinnitus and trouble swallowing.   Eyes:  Negative for pain, discharge, redness, itching and visual disturbance.  Respiratory:  Negative for cough, chest tightness, shortness of breath and wheezing.   Cardiovascular:  Negative for chest pain, palpitations and leg swelling.  Gastrointestinal:  Negative for abdominal distention, abdominal pain, blood in stool, constipation, diarrhea, nausea and vomiting.  Endocrine: Negative for cold intolerance, heat intolerance, polydipsia, polyphagia and polyuria.  Genitourinary:  Negative for difficulty urinating, dysuria, flank pain, frequency and urgency.  Musculoskeletal:  Negative for arthralgias, back pain, gait problem, joint swelling, myalgias, neck pain and neck stiffness.  Skin:  Negative for color change, pallor, rash and wound.  Neurological:  Negative for dizziness, syncope, speech difficulty, weakness, light-headedness, numbness and headaches.  Hematological:  Does not bruise/bleed easily.  Psychiatric/Behavioral:  Negative for agitation, behavioral problems, confusion, hallucinations, self-injury, sleep disturbance and suicidal ideas. The patient is not nervous/anxious.    Immunization History  Administered Date(s) Administered   Fluad Quad(high Dose 65+) 06/24/2019, 08/19/2020, 07/29/2021   Influenza, High  Dose Seasonal PF 09/17/2017, 06/17/2018   PFIZER Comirnaty(Gray Top)Covid-19 Tri-Sucrose Vaccine 01/31/2021   PFIZER(Purple Top)SARS-COV-2 Vaccination 11/08/2019, 11/29/2019, 07/30/2020   PPD Test 10/02/1997   Pfizer Covid-19 Vaccine Bivalent Booster 5yrs & up 07/15/2021   Pneumococcal Conjugate-13 05/10/2017   Pneumococcal Polysaccharide-23 06/13/2018   Td 04/30/1998   Tdap 04/19/2020   Pertinent  Health Maintenance Due  Topic Date Due   OPHTHALMOLOGY EXAM  03/23/2021   HEMOGLOBIN A1C  10/26/2021   FOOT EXAM  07/29/2022   DEXA SCAN  Completed   Fall Risk 04/29/2021 06/21/2021 07/07/2021 11/06/2021 11/10/2021  Falls in the past year? 0 0 0 - 0  Was there an injury with Fall? 0 0 0 - 0  Fall Risk Category Calculator 0 0 0 - 0  Fall Risk Category Low Low Low - Low  Patient Fall Risk Level Low fall risk Low fall risk Low fall risk Low fall risk Low fall risk  Patient at Risk for Falls Due to No Fall Risks No Fall Risks No Fall Risks - No Fall Risks  Fall risk Follow up Falls evaluation completed Falls evaluation completed Falls evaluation completed - Falls evaluation completed   Functional Status Survey:    Vitals:   11/10/21 0958  BP: 138/82  Pulse: 75  Resp: 16  Temp: (!) 96.3 F (35.7 C)  SpO2: 96%  Weight: 248 lb (112.5 kg)  Height: 5\' 3"  (1.6 m)   Body mass index is 43.93 kg/m. Physical Exam Vitals reviewed.  Constitutional:      General: She is not in acute distress.    Appearance: Normal appearance. She is obese. She is not ill-appearing or diaphoretic.  HENT:     Head: Normocephalic.     Right Ear: Tympanic membrane, ear canal and external ear normal. There is no impacted cerumen.     Left Ear: Tympanic membrane, ear canal and external ear normal. There is no impacted cerumen.     Nose: Nose normal. No congestion or rhinorrhea.     Mouth/Throat:     Mouth: Mucous membranes are moist.     Pharynx: Oropharynx is clear. No oropharyngeal exudate or posterior  oropharyngeal erythema.  Eyes:     General: No scleral icterus.       Right eye: No discharge.        Left eye: No discharge.     Extraocular Movements: Extraocular movements intact.     Conjunctiva/sclera: Conjunctivae normal.     Pupils: Pupils are equal, round, and reactive to light.  Neck:     Vascular: No carotid bruit.  Cardiovascular:     Rate and Rhythm: Normal rate and regular rhythm.     Pulses: Normal pulses.     Heart sounds: Normal heart sounds. No murmur heard.   No friction rub. No gallop.  Pulmonary:     Effort: Pulmonary effort is normal. No respiratory distress.     Breath sounds: Normal breath sounds. No wheezing, rhonchi or rales.  Chest:     Chest wall: No tenderness.  Abdominal:     General: Bowel sounds are normal. There is no distension.     Palpations: Abdomen is soft. There is no mass.     Tenderness: There is no abdominal tenderness. There is no right CVA tenderness, left CVA tenderness, guarding or rebound.  Musculoskeletal:        General: No swelling or tenderness. Normal range of motion.     Cervical back: Normal range of motion. No rigidity or tenderness.     Right lower leg: No edema.     Left lower leg: No edema.  Lymphadenopathy:     Cervical: No cervical adenopathy.  Skin:    General: Skin is warm and dry.     Coloration: Skin is not pale.     Findings: No bruising, erythema, lesion or rash.  Neurological:     Mental Status: She is alert and oriented to person, place, and time.     Cranial Nerves: No cranial nerve deficit.     Sensory: No sensory deficit.     Motor: No weakness.     Coordination: Coordination normal.     Gait: Gait normal.  Psychiatric:        Mood and Affect: Mood normal.        Speech: Speech normal.        Behavior: Behavior normal.        Thought Content: Thought content normal.        Judgment: Judgment normal.    Labs reviewed: Recent Labs    12/13/20 0856 07/26/21 0912  NA 142 141  K 3.8 3.7  CL 107  105  CO2 25 26  GLUCOSE 112* 97  BUN 14 13  CREATININE 1.01* 1.09*  CALCIUM 9.6 9.6   Recent Labs    12/13/20 0856 07/26/21 0912  AST  22 22  ALT 18 13  BILITOT 0.7 0.7  PROT 6.4 6.2   Recent Labs    12/13/20 0856  WBC 7.8  NEUTROABS 3,635  HGB 13.7  HCT 40.8  MCV 93.2  PLT 222   No results found for: TSH Lab Results  Component Value Date   HGBA1C 6.0 (H) 04/25/2021   Lab Results  Component Value Date   CHOL 156 07/26/2021   HDL 54 07/26/2021   LDLCALC 88 07/26/2021   TRIG 58 07/26/2021   CHOLHDL 2.9 07/26/2021    Significant Diagnostic Results in last 30 days:  No results found.  Assessment/Plan   Essential hypertension Recent Blood pressure readings elevated in [facility] and urgent care.Asymptomatic  - continue on Hyzaar  - Advised to check Blood pressure at home and record on log provided and notify provider if B/p > 140/90 - Please bring blood pressure log to visit for evaluation  - For home use only DME Other see comment: Blood pressure cuff machine use daily to check blood pressure    Family/ staff Communication: Reviewed plan of care with patient verbalized understanding   Labs/tests ordered: None   Next Appointment: Has appointment with PCP 12/26/2021   Sandrea Hughs, NP

## 2021-11-25 ENCOUNTER — Other Ambulatory Visit: Payer: Medicare PPO

## 2021-11-25 ENCOUNTER — Other Ambulatory Visit: Payer: Self-pay

## 2021-11-25 DIAGNOSIS — I10 Essential (primary) hypertension: Secondary | ICD-10-CM

## 2021-11-25 DIAGNOSIS — E785 Hyperlipidemia, unspecified: Secondary | ICD-10-CM | POA: Diagnosis not present

## 2021-11-25 DIAGNOSIS — N181 Chronic kidney disease, stage 1: Secondary | ICD-10-CM | POA: Diagnosis not present

## 2021-11-25 DIAGNOSIS — E1122 Type 2 diabetes mellitus with diabetic chronic kidney disease: Secondary | ICD-10-CM | POA: Diagnosis not present

## 2021-11-26 LAB — CBC WITH DIFFERENTIAL/PLATELET
Absolute Monocytes: 642 cells/uL (ref 200–950)
Basophils Absolute: 26 cells/uL (ref 0–200)
Basophils Relative: 0.3 %
Eosinophils Absolute: 238 cells/uL (ref 15–500)
Eosinophils Relative: 2.7 %
HCT: 40.5 % (ref 35.0–45.0)
Hemoglobin: 13.4 g/dL (ref 11.7–15.5)
Lymphs Abs: 3696 cells/uL (ref 850–3900)
MCH: 31.4 pg (ref 27.0–33.0)
MCHC: 33.1 g/dL (ref 32.0–36.0)
MCV: 94.8 fL (ref 80.0–100.0)
MPV: 10.6 fL (ref 7.5–12.5)
Monocytes Relative: 7.3 %
Neutro Abs: 4198 cells/uL (ref 1500–7800)
Neutrophils Relative %: 47.7 %
Platelets: 209 10*3/uL (ref 140–400)
RBC: 4.27 10*6/uL (ref 3.80–5.10)
RDW: 13.6 % (ref 11.0–15.0)
Total Lymphocyte: 42 %
WBC: 8.8 10*3/uL (ref 3.8–10.8)

## 2021-11-26 LAB — COMPLETE METABOLIC PANEL WITH GFR
AG Ratio: 1.9 (calc) (ref 1.0–2.5)
ALT: 10 U/L (ref 6–29)
AST: 20 U/L (ref 10–35)
Albumin: 4.2 g/dL (ref 3.6–5.1)
Alkaline phosphatase (APISO): 91 U/L (ref 37–153)
BUN/Creatinine Ratio: 14 (calc) (ref 6–22)
BUN: 15 mg/dL (ref 7–25)
CO2: 28 mmol/L (ref 20–32)
Calcium: 9.6 mg/dL (ref 8.6–10.4)
Chloride: 108 mmol/L (ref 98–110)
Creat: 1.09 mg/dL — ABNORMAL HIGH (ref 0.60–1.00)
Globulin: 2.2 g/dL (calc) (ref 1.9–3.7)
Glucose, Bld: 105 mg/dL — ABNORMAL HIGH (ref 65–99)
Potassium: 3.9 mmol/L (ref 3.5–5.3)
Sodium: 144 mmol/L (ref 135–146)
Total Bilirubin: 0.6 mg/dL (ref 0.2–1.2)
Total Protein: 6.4 g/dL (ref 6.1–8.1)
eGFR: 52 mL/min/{1.73_m2} — ABNORMAL LOW (ref >=60)

## 2021-11-26 LAB — LIPID PANEL
Cholesterol: 156 mg/dL (ref <200)
HDL: 52 mg/dL (ref >=50)
LDL Cholesterol (Calc): 89 mg/dL (calc)
Non-HDL Cholesterol (Calc): 104 mg/dL (calc) (ref <130)
Total CHOL/HDL Ratio: 3 (calc) (ref <5.0)
Triglycerides: 65 mg/dL (ref <150)

## 2021-11-26 LAB — HEMOGLOBIN A1C
Hgb A1c MFr Bld: 6.2 % of total Hgb — ABNORMAL HIGH (ref <5.7)
Mean Plasma Glucose: 131 mg/dL
eAG (mmol/L): 7.3 mmol/L

## 2021-11-28 ENCOUNTER — Ambulatory Visit (INDEPENDENT_AMBULATORY_CARE_PROVIDER_SITE_OTHER): Payer: Medicare PPO | Admitting: Nurse Practitioner

## 2021-11-28 ENCOUNTER — Encounter: Payer: Self-pay | Admitting: Nurse Practitioner

## 2021-11-28 ENCOUNTER — Other Ambulatory Visit: Payer: Self-pay

## 2021-11-28 VITALS — BP 128/80 | HR 72 | Temp 97.9°F | Ht 63.0 in | Wt 248.0 lb

## 2021-11-28 DIAGNOSIS — I10 Essential (primary) hypertension: Secondary | ICD-10-CM | POA: Diagnosis not present

## 2021-11-28 DIAGNOSIS — B3731 Acute candidiasis of vulva and vagina: Secondary | ICD-10-CM

## 2021-11-28 DIAGNOSIS — N181 Chronic kidney disease, stage 1: Secondary | ICD-10-CM | POA: Diagnosis not present

## 2021-11-28 DIAGNOSIS — E785 Hyperlipidemia, unspecified: Secondary | ICD-10-CM | POA: Diagnosis not present

## 2021-11-28 DIAGNOSIS — E1122 Type 2 diabetes mellitus with diabetic chronic kidney disease: Secondary | ICD-10-CM

## 2021-11-28 MED ORDER — ATORVASTATIN CALCIUM 20 MG PO TABS: 20.0000 mg | ORAL_TABLET | Freq: Every day | ORAL | 1 refills | Status: DC

## 2021-11-28 MED ORDER — FLUCONAZOLE 150 MG PO TABS: 150.0000 mg | ORAL_TABLET | Freq: Once | ORAL | 0 refills | Status: AC

## 2021-11-28 NOTE — Patient Instructions (Addendum)
Increase lipitor to 20 mg daily- take 2 table complete and new Rx will be 1 tablet

## 2021-11-28 NOTE — Progress Notes (Signed)
Careteam: Patient Care Team: Lauree Chandler, NP as PCP - General (Geriatric Medicine) Garald Balding, MD as Consulting Physician (Orthopedic Surgery) Rutherford Guys, MD as Consulting Physician (Ophthalmology) Druscilla Brownie, MD as Consulting Physician (Dermatology) Gatha Mayer, MD as Consulting Physician (Gastroenterology)  PLACE OF SERVICE:  Story Directive information Does Patient Have a Medical Advance Directive?: No, Would patient like information on creating a medical advance directive?: No - Patient declined  No Known Allergies  Chief Complaint  Patient presents with   Medical Management of Chronic Issues    4 month follow-up. Discuss need for shingrix or postpone if patient refuses. Remind patient to contact eye doctor and change PCP to [facility] as the note from Dr.Shapiro still has Dr.Green listed as PCP.      HPI: Patient is a 81 y.o. female for routine follow up.   DM- diet controlled. Up to date with eye exam.   HTN- reports blood pressure has been high. She takes blood pressure medication around 2 pm.  Has not taken today. Has not eaten breakfast. Attempts low sodium diet.   Heal pain has resolved with new foot wear  Walking 3 times a week ~30 mins. Plans to increase time and frequency  Reports vaginal odor and itching. No abnormal discharge or dryness  Review of Systems:  Review of Systems  Constitutional:  Negative for chills, fever and weight loss.  HENT:  Negative for tinnitus.   Respiratory:  Negative for cough, sputum production and shortness of breath.   Cardiovascular:  Negative for chest pain, palpitations and leg swelling.  Gastrointestinal:  Negative for abdominal pain, constipation, diarrhea and heartburn.  Genitourinary:  Negative for dysuria, frequency and urgency.  Musculoskeletal:  Negative for back pain, falls, joint pain and myalgias.  Skin: Negative.   Neurological:  Negative for dizziness and headaches.   Psychiatric/Behavioral:  Negative for depression and memory loss. The patient does not have insomnia.    Past Medical History:  Diagnosis Date   Arthritis    Ankle   Asymptomatic varicose veins    Benign neoplasm of colon    Bilateral shoulder pain 07/02/14   Cataract    Bilateral - just watching   Cervicitis and endocervicitis 09/13/2011   Disorder of bone and cartilage, unspecified    Disorders of bursae and tendons in shoulder region, unspecified    External hemorrhoids without mention of complication    Fibrocystic breast    Generalized osteoarthrosis, unspecified site    GERD (gastroesophageal reflux disease)    diet controlled, No meds   Hiatal hernia    Hypertension    Impacted cerumen 11/07/2011   Obesity, unspecified    Other diseases of nasal cavity and sinuses(478.19)    Pain in joint, ankle and foot    Pain in joint, pelvic region and thigh    Pain in joint, shoulder region    Reflux esophagitis    SVD (spontaneous vaginal delivery)    x 2   Unspecified essential hypertension    Unspecified vitamin D deficiency    Past Surgical History:  Procedure Laterality Date   COLONOSCOPY  2006   Dr.Orr   COLONOSCOPY  07/08/2013   Dr. Carlean Purl   FLEXIBLE SIGMOIDOSCOPY  1990   Hemorrhoids    MOUTH SURGERY  2005   DR LUTINS - tooth ext and gum surgery   SHOULDER ARTHROSCOPY W/ ACROMIAL REPAIR Right 07/02/14   Dr. Durward Fortes   UPPER GASTROINTESTINAL ENDOSCOPY  normal   Social History:   reports that she has never smoked. She has never used smokeless tobacco. She reports current alcohol use. She reports that she does not use drugs.  Family History  Problem Relation Age of Onset   Hypertension Mother    Kidney disease Mother        Renal failure   Cancer Brother        Lymphoma   ADD / ADHD Son    Seizures Son    Hypertension Sister    Dementia Sister    Hypertension Sister    Hypertension Sister    Cancer - Other Sister    Early death Brother        Some  type of accident   Colon cancer Father 29   Stomach cancer Neg Hx    Rectal cancer Neg Hx     Medications: Patient's Medications  New Prescriptions   FLUCONAZOLE (DIFLUCAN) 150 MG TABLET    Take 1 tablet (150 mg total) by mouth once for 1 dose.  Previous Medications   ASPIRIN 81 MG TABLET    Take 81 mg by mouth daily.   CALCIUM CARBONATE-VITAMIN D (CALCIUM-VITAMIN D) 500-200 MG-UNIT PER TABLET    Take 1 tablet by mouth daily.   CARBOXYMETHYLCELL-HYPROMELLOSE 0.25-0.3 % GEL    Apply 1 application to eye daily. to alleviate irritation.   CHOLECALCIFEROL (VITAMIN D-3 PO)    Take 1 tablet by mouth 2 (two) times daily.   HYDROCORTISONE (ANUSOL-HC) 25 MG SUPPOSITORY    Place 1 suppository (25 mg total) rectally daily as needed for hemorrhoids or anal itching.   LOSARTAN-HYDROCHLOROTHIAZIDE (HYZAAR) 50-12.5 MG TABLET    Take 1 tablet by mouth daily.   TURMERIC PO    Take 1 tablet by mouth as needed. Also taking turmeric liquid supplement   VITAMIN B-12 (CYANOCOBALAMIN) 1000 MCG TABLET    Take 1,000 mcg by mouth daily.   VITAMIN C (ASCORBIC ACID) 500 MG TABLET    Take 500 mg by mouth daily.   VITAMIN E 400 UNIT CAPSULE    Take 400 Units by mouth daily.  Modified Medications   Modified Medication Previous Medication   ATORVASTATIN (LIPITOR) 20 MG TABLET atorvastatin (LIPITOR) 10 MG tablet      Take 1 tablet (20 mg total) by mouth at bedtime.    Take 1 tablet (10 mg total) by mouth at bedtime.  Discontinued Medications   No medications on file    Physical Exam:  Vitals:   11/28/21 0907 11/28/21 1228  BP: (!) 142/80 128/80  Pulse: 72   Temp: 97.9 F (36.6 C)   TempSrc: Temporal   Weight: 248 lb (112.5 kg)   Height: $Remove'5\' 3"'HasRfNP$  (1.6 m)    Body mass index is 43.93 kg/m. Wt Readings from Last 3 Encounters:  11/28/21 248 lb (112.5 kg)  11/10/21 248 lb (112.5 kg)  07/29/21 244 lb (110.7 kg)    Physical Exam Exam conducted with a chaperone present.  Constitutional:      General: She is  not in acute distress.    Appearance: She is well-developed. She is not diaphoretic.  HENT:     Head: Normocephalic and atraumatic.     Mouth/Throat:     Pharynx: No oropharyngeal exudate.  Eyes:     Conjunctiva/sclera: Conjunctivae normal.     Pupils: Pupils are equal, round, and reactive to light.  Cardiovascular:     Rate and Rhythm: Normal rate and regular rhythm.  Heart sounds: Normal heart sounds.  Pulmonary:     Effort: Pulmonary effort is normal.     Breath sounds: Normal breath sounds.  Abdominal:     General: Bowel sounds are normal.     Palpations: Abdomen is soft.  Genitourinary:    General: Normal vulva.     Exam position: Knee-chest position.     Pubic Area: No rash.      Vagina: Vaginal discharge (white discharge) present.  Musculoskeletal:     Cervical back: Normal range of motion and neck supple.     Right lower leg: No edema.     Left lower leg: No edema.  Skin:    General: Skin is warm and dry.  Neurological:     Mental Status: She is alert.  Psychiatric:        Mood and Affect: Mood normal.    Labs reviewed: Basic Metabolic Panel: Recent Labs    12/13/20 0856 07/26/21 0912 11/25/21 0831  NA 142 141 144  K 3.8 3.7 3.9  CL 107 105 108  CO2 $Re'25 26 28  'kUT$ GLUCOSE 112* 97 105*  BUN $Re'14 13 15  'mGm$ CREATININE 1.01* 1.09* 1.09*  CALCIUM 9.6 9.6 9.6   Liver Function Tests: Recent Labs    12/13/20 0856 07/26/21 0912 11/25/21 0831  AST $Re'22 22 20  'rHJ$ ALT $R'18 13 10  'tD$ BILITOT 0.7 0.7 0.6  PROT 6.4 6.2 6.4   No results for input(s): LIPASE, AMYLASE in the last 8760 hours. No results for input(s): AMMONIA in the last 8760 hours. CBC: Recent Labs    12/13/20 0856 11/25/21 0831  WBC 7.8 8.8  NEUTROABS 3,635 4,198  HGB 13.7 13.4  HCT 40.8 40.5  MCV 93.2 94.8  PLT 222 209   Lipid Panel: Recent Labs    04/25/21 0913 07/26/21 0912 11/25/21 0831  CHOL 223* 156 156  HDL 62 54 52  LDLCALC 142* 88 89  TRIG 85 58 65  CHOLHDL 3.6 2.9 3.0   TSH: No  results for input(s): TSH in the last 8760 hours. A1C: Lab Results  Component Value Date   HGBA1C 6.2 (H) 11/25/2021     Assessment/Plan  1. Type 2 diabetes mellitus with stage 1 chronic kidney disease, without long-term current use of insulin (HCC) -Encouraged dietary compliance, routine foot care/monitoring and to keep up with diabetic eye exams through ophthalmology  - Hemoglobin A1c; Future - CMP with eGFR([facility]); Future  2. Morbid obesity ([place]) -education provided on healthy weight loss through increase in physical activity and proper nutrition   3. Hyperlipidemia, unspecified hyperlipidemia type -LDL goal <70, will increase lipitor to 20 mg daily, follow up prior to next appt  - atorvastatin (LIPITOR) 20 MG tablet; Take 1 tablet (20 mg total) by mouth at bedtime.  Dispense: 90 tablet; Refill: 1 - CMP with eGFR([facility]); Future  4. Vaginal yeast infection - fluconazole (DIFLUCAN) 150 MG tablet; Take 1 tablet (150 mg total) by mouth once for 1 dose.  Dispense: 1 tablet; Refill: 0  5. Essential hypertension Improved on recheck. Continue current regimen   Return in about 4 months (around 03/28/2022) for routine follow up, labs prior . [employer]. [place], [facility] [phone]

## 2022-02-08 ENCOUNTER — Ambulatory Visit: Payer: Medicare PPO | Admitting: Orthopaedic Surgery

## 2022-02-08 ENCOUNTER — Encounter: Payer: Self-pay | Admitting: Orthopaedic Surgery

## 2022-02-08 ENCOUNTER — Ambulatory Visit: Payer: Self-pay

## 2022-02-08 DIAGNOSIS — G8929 Other chronic pain: Secondary | ICD-10-CM

## 2022-02-08 DIAGNOSIS — M25512 Pain in left shoulder: Secondary | ICD-10-CM | POA: Insufficient documentation

## 2022-02-08 MED ORDER — METHYLPREDNISOLONE ACETATE 40 MG/ML IJ SUSP
80.0000 mg | INTRAMUSCULAR | Status: AC | PRN
  Administered 2022-02-08: 80 mg via INTRA_ARTICULAR

## 2022-02-08 MED ORDER — LIDOCAINE HCL 1 % IJ SOLN
2.0000 mL | INTRAMUSCULAR | Status: AC | PRN
  Administered 2022-02-08: 2 mL

## 2022-02-08 MED ORDER — BUPIVACAINE HCL 0.25 % IJ SOLN
2.0000 mL | INTRAMUSCULAR | Status: AC | PRN
  Administered 2022-02-08: 2 mL via INTRA_ARTICULAR

## 2022-02-08 NOTE — Progress Notes (Signed)
? ?Office Visit Note ?  ?Patient: Danielle Pierce           ?Date of Birth: 04-Sep-1941           ?MRN: [id number] ?Visit Date: 02/08/2022 ?             ?Requested by: Lauree Chandler, NP ?[address] ?[place],  [place] [address] ?PCP: Lauree Chandler, NP ? ? ?Assessment & Plan: ?Visit Diagnoses: Left shoulder pain ? ?Plan: Pleasant 81 year old woman with history of left shoulder pain.  Had positive impingement findings.  Denies any fall or any injury.  She has had surgery on the right shoulder in the past.  She has tolerated steroid injections very well.  X-rays consistent with degenerative changes arthritis exam also consistent with impingement findings.  We will go forward with injection into the left shoulder joint today.  May follow-up as needed she understands this may elevate her blood sugars ? ?Follow-Up Instructions: No follow-ups on file.  ? ?Orders:  ?No orders of the defined types were placed in this encounter. ? ?No orders of the defined types were placed in this encounter. ? ? ? ? Procedures: ?Large Joint Inj: L glenohumeral on 02/08/2022 10:30 AM ?Indications: diagnostic evaluation and pain ?Details: 25 G 1.5 in needle, anterior approach ? ?Arthrogram: No ? ?Medications: 2 mL lidocaine 1 %; 80 mg methylPREDNISolone acetate 40 MG/ML; 2 mL bupivacaine 0.25 % ?Outcome: tolerated well, no immediate complications ?Procedure, treatment alternatives, risks and benefits explained, specific risks discussed. Consent was given by the patient.  ? ? ? ? ?Clinical Data: ?No additional findings. ? ? ?Subjective: ?Chief Complaint  ?Patient presents with  ? Left Shoulder - Pain  ?Patient presents today for left shoulder pain. She said that it has been hurting for months. No known injury. Most of her pain is located anteriorly. She has limited range of motion. She is right hand dominant. She is not taking anything for pain relief.  ? ?HPI ? ?Review of Systems  ?All other systems reviewed and are  negative. ? ? ?Objective: ?Vital Signs: There were no vitals taken for this visit. ? ?Physical Exam ?Constitutional:   ?   Appearance: Normal appearance.  ?Pulmonary:  ?   Effort: Pulmonary effort is normal.  ?Neurological:  ?   Mental Status: She is alert.  ?Psychiatric:     ?   Mood and Affect: Mood normal.     ?   Behavior: Behavior normal.  ? ? ?Ortho Exam ?Examination of her left shoulder she has no redness no cellulitis.  She is tender to palpation anteriorly.  She is lacking just a small amount of forward elevation negative drop dropped arm test she can internally rotate to her belt line.  She has a positive empty can test and positive impingement findings distal circulation pulses are intact ?Specialty Comments:  ?No specialty comments available. ? ?Imaging: ?No results found. ? ? ?PMFS History: ?Patient Active Problem List  ? Diagnosis Date Noted  ? Flatulence/gas pain/belching 06/21/2021  ? Upper back pain 06/21/2021  ? Heel pain 05/03/2021  ? Trigger thumb, right thumb 07/27/2020  ? Body mass index (BMI) of 40.1-44.9 in adult [facility]) 11/14/2018  ? Localized osteoarthritis of left knee 05/10/2017  ? Female perineal bleeding 11/08/2016  ? HLD (hyperlipidemia) 01/19/2016  ? Fasciitis 12/15/2015  ? Corn 05/18/2015  ? Excessive cerumen in both ear canals 05/18/2015  ? Age spots 04/08/2014  ? Blepharochalasis of right eye 04/08/2014  ? Lumbago  04/08/2014  ? Personal history of colonic adenomas 07/08/2013  ? Family history of colon cancer- father in 1s 07/08/2013  ? Internal and external bleeding hemorrhoids 07/08/2013  ? DM (diabetes mellitus), type 2 with renal complications ([place]) 36/62/9476  ? Essential hypertension 02/20/2013  ? Obesity   ? Vitamin D deficiency   ? ?Past Medical History:  ?Diagnosis Date  ? Arthritis   ? Ankle  ? Asymptomatic varicose veins   ? Benign neoplasm of colon   ? Bilateral shoulder pain 07/02/14  ? Cataract   ? Bilateral - just watching  ? Cervicitis and endocervicitis 09/13/2011  ?  Disorder of bone and cartilage, unspecified   ? Disorders of bursae and tendons in shoulder region, unspecified   ? External hemorrhoids without mention of complication   ? Fibrocystic breast   ? Generalized osteoarthrosis, unspecified site   ? GERD (gastroesophageal reflux disease)   ? diet controlled, No meds  ? Hiatal hernia   ? Hypertension   ? Impacted cerumen 11/07/2011  ? Obesity, unspecified   ? Other diseases of nasal cavity and sinuses(478.19)   ? Pain in joint, ankle and foot   ? Pain in joint, pelvic region and thigh   ? Pain in joint, shoulder region   ? Reflux esophagitis   ? SVD (spontaneous vaginal delivery)   ? x 2  ? Unspecified essential hypertension   ? Unspecified vitamin D deficiency   ?  ?Family History  ?Problem Relation Age of Onset  ? Hypertension Mother   ? Kidney disease Mother   ?     Renal failure  ? Cancer Brother   ?     Lymphoma  ? ADD / ADHD Son   ? Seizures Son   ? Hypertension Sister   ? Dementia Sister   ? Hypertension Sister   ? Hypertension Sister   ? Cancer - Other Sister   ? Early death Brother   ?     Some type of accident  ? Colon cancer Father 83  ? Stomach cancer Neg Hx   ? Rectal cancer Neg Hx   ?  ?Past Surgical History:  ?Procedure Laterality Date  ? COLONOSCOPY  2006  ? Dr.Orr  ? COLONOSCOPY  07/08/2013  ? Dr. Carlean Purl  ? [place]  ? Hemorrhoids   ? MOUTH SURGERY  2005  ? DR LUTINS - tooth ext and gum surgery  ? SHOULDER ARTHROSCOPY W/ ACROMIAL REPAIR Right 07/02/14  ? Dr. Durward Fortes  ? UPPER GASTROINTESTINAL ENDOSCOPY    ? normal  ? ?Social History  ? ?Occupational History  ? Occupation: retired Landscape architect  ?Tobacco Use  ? Smoking status: Never  ? Smokeless tobacco: Never  ?Vaping Use  ? Vaping Use: Never used  ?Substance and Sexual Activity  ? Alcohol use: Yes  ?  Comment: occasional mixed drink   ? Drug use: No  ? Sexual activity: Not Currently  ?  Birth control/protection: Post-menopausal  ? ? ? ? ? ? ?

## 2022-03-27 ENCOUNTER — Other Ambulatory Visit: Payer: Medicare PPO

## 2022-03-27 DIAGNOSIS — E1122 Type 2 diabetes mellitus with diabetic chronic kidney disease: Secondary | ICD-10-CM

## 2022-03-27 DIAGNOSIS — N181 Chronic kidney disease, stage 1: Secondary | ICD-10-CM | POA: Diagnosis not present

## 2022-03-27 DIAGNOSIS — E785 Hyperlipidemia, unspecified: Secondary | ICD-10-CM

## 2022-03-28 LAB — COMPLETE METABOLIC PANEL WITH GFR
AG Ratio: 2.3 (calc) (ref 1.0–2.5)
ALT: 17 U/L (ref 6–29)
AST: 28 U/L (ref 10–35)
Albumin: 4.4 g/dL (ref 3.6–5.1)
Alkaline phosphatase (APISO): 92 U/L (ref 37–153)
BUN/Creatinine Ratio: 12 (calc) (ref 6–22)
BUN: 13 mg/dL (ref 7–25)
CO2: 28 mmol/L (ref 20–32)
Calcium: 10 mg/dL (ref 8.6–10.4)
Chloride: 105 mmol/L (ref 98–110)
Creat: 1.06 mg/dL — ABNORMAL HIGH (ref 0.60–1.00)
Globulin: 1.9 g/dL (calc) (ref 1.9–3.7)
Glucose, Bld: 103 mg/dL — ABNORMAL HIGH (ref 65–99)
Potassium: 4.1 mmol/L (ref 3.5–5.3)
Sodium: 141 mmol/L (ref 135–146)
Total Bilirubin: 0.9 mg/dL (ref 0.2–1.2)
Total Protein: 6.3 g/dL (ref 6.1–8.1)
eGFR: 53 mL/min/{1.73_m2} — ABNORMAL LOW (ref >=60)

## 2022-03-28 LAB — LIPID PANEL
Cholesterol: 144 mg/dL (ref <200)
HDL: 60 mg/dL (ref >=50)
LDL Cholesterol (Calc): 69 mg/dL (calc)
Non-HDL Cholesterol (Calc): 84 mg/dL (calc) (ref <130)
Total CHOL/HDL Ratio: 2.4 (calc) (ref <5.0)
Triglycerides: 71 mg/dL (ref <150)

## 2022-03-28 LAB — HEMOGLOBIN A1C
Hgb A1c MFr Bld: 6 % of total Hgb — ABNORMAL HIGH (ref <5.7)
Mean Plasma Glucose: 126 mg/dL
eAG (mmol/L): 7 mmol/L

## 2022-03-30 ENCOUNTER — Encounter: Payer: Self-pay | Admitting: Nurse Practitioner

## 2022-03-30 NOTE — Progress Notes (Signed)
Careteam: Patient Care Team: Lauree Chandler, NP as PCP - General (Geriatric Medicine) Garald Balding, MD as Consulting Physician (Orthopedic Surgery) Rutherford Guys, MD as Consulting Physician (Ophthalmology) Druscilla Brownie, MD as Consulting Physician (Dermatology) Gatha Mayer, MD as Consulting Physician (Gastroenterology)  PLACE OF SERVICE:  [place] Directive information Does Patient Have a Medical Advance Directive?: Yes, Type of Advance Directive: [place];Living will  No Known Allergies  Chief Complaint  Patient presents with   Medical Management of Chronic Issues    4 month follow-up. Discuss need for shingrix or pot pone if patient refuses. NCIR verified. Patient denies receiving any vaccines since last visit.       HPI: Patient is a 81 y.o. female for routine follow up.   She has done traveling with her daughter. Doing a lot more walking. No complaints or concerns today.   Htn- well controlled on losartan-hctz.   DM_ not on medication at this time.   Hyperlipidemia- lipitor increased at last visit, no side effects of medication.    Review of Systems:  Review of Systems  Constitutional:  Negative for chills, fever and weight loss.  HENT:  Negative for tinnitus.   Respiratory:  Negative for cough, sputum production and shortness of breath.   Cardiovascular:  Negative for chest pain, palpitations and leg swelling.  Gastrointestinal:  Negative for abdominal pain, constipation, diarrhea and heartburn.  Genitourinary:  Negative for dysuria, frequency and urgency.  Musculoskeletal:  Negative for back pain, falls, joint pain and myalgias.  Skin: Negative.   Neurological:  Negative for dizziness and headaches.  Psychiatric/Behavioral:  Negative for depression and memory loss. The patient does not have insomnia.     Past Medical History:  Diagnosis Date   Arthritis    Ankle   Asymptomatic varicose veins    Benign  neoplasm of colon    Bilateral shoulder pain 07/02/14   Cataract    Bilateral - just watching   Cervicitis and endocervicitis 09/13/2011   Disorder of bone and cartilage, unspecified    Disorders of bursae and tendons in shoulder region, unspecified    External hemorrhoids without mention of complication    Fibrocystic breast    Generalized osteoarthrosis, unspecified site    GERD (gastroesophageal reflux disease)    diet controlled, No meds   Hiatal hernia    Hypertension    Impacted cerumen 11/07/2011   Obesity, unspecified    Other diseases of nasal cavity and sinuses(478.19)    Pain in joint, ankle and foot    Pain in joint, pelvic region and thigh    Pain in joint, shoulder region    Reflux esophagitis    SVD (spontaneous vaginal delivery)    x 2   Unspecified essential hypertension    Unspecified vitamin D deficiency    Past Surgical History:  Procedure Laterality Date   COLONOSCOPY  2006   Dr.Orr   COLONOSCOPY  07/08/2013   Dr. Carlean Purl   FLEXIBLE SIGMOIDOSCOPY  1990   Hemorrhoids    MOUTH SURGERY  2005   DR LUTINS - tooth ext and gum surgery   SHOULDER ARTHROSCOPY W/ ACROMIAL REPAIR Right 07/02/14   Dr. Durward Fortes   UPPER GASTROINTESTINAL ENDOSCOPY     normal   Social History:   reports that she has never smoked. She has never used smokeless tobacco. She reports current alcohol use. She reports that she does not use drugs.  Family History  Problem Relation Age  of Onset   Hypertension Mother    Kidney disease Mother        Renal failure   Cancer Brother        Lymphoma   ADD / ADHD Son    Seizures Son    Hypertension Sister    Dementia Sister    Hypertension Sister    Hypertension Sister    Cancer - Other Sister    Early death Brother        Some type of accident   Colon cancer Father 108   Stomach cancer Neg Hx    Rectal cancer Neg Hx     Medications: Patient's Medications  New Prescriptions   No medications on file  Previous Medications    ASPIRIN 81 MG TABLET    Take 81 mg by mouth daily.   ATORVASTATIN (LIPITOR) 20 MG TABLET    Take 1 tablet (20 mg total) by mouth at bedtime.   CALCIUM CARBONATE-VITAMIN D (CALCIUM-VITAMIN D) 500-200 MG-UNIT PER TABLET    Take 1 tablet by mouth daily.   CARBOXYMETHYLCELL-HYPROMELLOSE 0.25-0.3 % GEL    Apply 1 application to eye daily. to alleviate irritation.   CHOLECALCIFEROL (VITAMIN D-3 PO)    Take 1 tablet by mouth 2 (two) times daily.   HYDROCORTISONE (ANUSOL-HC) 25 MG SUPPOSITORY    Place 1 suppository (25 mg total) rectally daily as needed for hemorrhoids or anal itching.   LOSARTAN-HYDROCHLOROTHIAZIDE (HYZAAR) 50-12.5 MG TABLET    Take 1 tablet by mouth daily.   TURMERIC PO    Take 1 tablet by mouth as needed. Also taking turmeric liquid supplement   VITAMIN B-12 (CYANOCOBALAMIN) 1000 MCG TABLET    Take 1,000 mcg by mouth daily.   VITAMIN C (ASCORBIC ACID) 500 MG TABLET    Take 500 mg by mouth daily.   VITAMIN E 400 UNIT CAPSULE    Take 400 Units by mouth daily.  Modified Medications   No medications on file  Discontinued Medications   No medications on file    Physical Exam:  Vitals:   03/31/22 1011  BP: 126/84  Pulse: 71  Temp: 97.9 F (36.6 C)  TempSrc: Temporal  SpO2: 97%  Weight: 248 lb (112.5 kg)  Height: '5\' 3"'$  (1.6 m)   Body mass index is 43.93 kg/m. Wt Readings from Last 3 Encounters:  03/31/22 248 lb (112.5 kg)  11/28/21 248 lb (112.5 kg)  11/10/21 248 lb (112.5 kg)    Physical Exam Constitutional:      General: She is not in acute distress.    Appearance: She is well-developed. She is obese. She is not diaphoretic.  HENT:     Head: Normocephalic and atraumatic.     Mouth/Throat:     Pharynx: No oropharyngeal exudate.  Eyes:     Conjunctiva/sclera: Conjunctivae normal.     Pupils: Pupils are equal, round, and reactive to light.  Cardiovascular:     Rate and Rhythm: Normal rate and regular rhythm.     Heart sounds: Normal heart sounds.  Pulmonary:      Effort: Pulmonary effort is normal.     Breath sounds: Normal breath sounds.  Abdominal:     General: Bowel sounds are normal.     Palpations: Abdomen is soft.  Musculoskeletal:     Cervical back: Normal range of motion and neck supple.     Right lower leg: No edema.     Left lower leg: No edema.  Skin:    General: Skin  is warm and dry.  Neurological:     Mental Status: She is alert.  Psychiatric:        Mood and Affect: Mood normal.     Labs reviewed: Basic Metabolic Panel: Recent Labs    07/26/21 0912 11/25/21 0831 03/27/22 0858  NA 141 144 141  K 3.7 3.9 4.1  CL 105 108 105  CO2 '26 28 28  '$ GLUCOSE 97 105* 103*  BUN '13 15 13  '$ CREATININE 1.09* 1.09* 1.06*  CALCIUM 9.6 9.6 10.0   Liver Function Tests: Recent Labs    07/26/21 0912 11/25/21 0831 03/27/22 0858  AST '22 20 28  '$ ALT '13 10 17  '$ BILITOT 0.7 0.6 0.9  PROT 6.2 6.4 6.3   No results for input(s): "LIPASE", "AMYLASE" in the last 8760 hours. No results for input(s): "AMMONIA" in the last 8760 hours. CBC: Recent Labs    11/25/21 0831  WBC 8.8  NEUTROABS 4,198  HGB 13.4  HCT 40.5  MCV 94.8  PLT 209   Lipid Panel: Recent Labs    07/26/21 0912 11/25/21 0831 03/27/22 0858  CHOL 156 156 144  HDL 54 52 60  LDLCALC 88 89 69  TRIG 58 65 71  CHOLHDL 2.9 3.0 2.4   TSH: No results for input(s): "TSH" in the last 8760 hours. A1C: Lab Results  Component Value Date   HGBA1C 6.0 (H) 03/27/2022     Assessment/Plan 1. Essential hypertension -Blood pressure well controlled Continue current medications follow metabolic panel - losartan-hydrochlorothiazide (HYZAAR) 50-12.5 MG tablet; Take 1 tablet by mouth daily.  Dispense: 90 tablet; Refill: 3  2. Type 2 diabetes mellitus with stage 3a chronic kidney disease, without long-term current use of insulin (HCC) -a1c at goal, Encouraged dietary compliance, routine foot care/monitoring and to keep up with diabetic eye exams through ophthalmology  - Urine  microalbumin-creatinine with uACR  3. Class 3 severe obesity due to excess calories with serious comorbidity and body mass index (BMI) of 45.0 to 49.9 in adult [facility]) --education provided on healthy weight loss through increase in physical activity and proper nutrition    4. Hyperlipidemia, unspecified hyperlipidemia type -now at goal. continue lipitor with dietary modification - atorvastatin (LIPITOR) 20 MG tablet; Take 1 tablet (20 mg total) by mouth at bedtime.  Dispense: 90 tablet; Refill: 3  5. Stage 3a chronic kidney disease ([place]) -Chronic and stable Encourage proper hydration follow metabolic panel Avoid nephrotoxic meds (NSAIDS)    Return in about 6 months (around 09/30/2022) for routine followup, labs prior to visit.: Carisma Troupe K. [place], [facility] [phone]

## 2022-03-31 ENCOUNTER — Encounter: Payer: Self-pay | Admitting: Nurse Practitioner

## 2022-03-31 ENCOUNTER — Ambulatory Visit (INDEPENDENT_AMBULATORY_CARE_PROVIDER_SITE_OTHER): Payer: Medicare PPO | Admitting: Nurse Practitioner

## 2022-03-31 VITALS — BP 126/84 | HR 71 | Temp 97.9°F | Ht 63.0 in | Wt 248.0 lb

## 2022-03-31 DIAGNOSIS — Z6841 Body Mass Index (BMI) 40.0 and over, adult: Secondary | ICD-10-CM | POA: Diagnosis not present

## 2022-03-31 DIAGNOSIS — E1122 Type 2 diabetes mellitus with diabetic chronic kidney disease: Secondary | ICD-10-CM | POA: Diagnosis not present

## 2022-03-31 DIAGNOSIS — N1831 Chronic kidney disease, stage 3a: Secondary | ICD-10-CM

## 2022-03-31 DIAGNOSIS — I10 Essential (primary) hypertension: Secondary | ICD-10-CM

## 2022-03-31 DIAGNOSIS — E785 Hyperlipidemia, unspecified: Secondary | ICD-10-CM

## 2022-03-31 MED ORDER — ATORVASTATIN CALCIUM 20 MG PO TABS: 20.0000 mg | ORAL_TABLET | Freq: Every day | ORAL | 3 refills | Status: DC

## 2022-03-31 MED ORDER — LOSARTAN POTASSIUM-HCTZ 50-12.5 MG PO TABS: 1.0000 | ORAL_TABLET | Freq: Every day | ORAL | 3 refills | Status: DC

## 2022-03-31 NOTE — Patient Instructions (Signed)
To get shingles vaccine at your local pharmacy.

## 2022-04-01 LAB — MICROALBUMIN / CREATININE URINE RATIO
Creatinine, Urine: 154 mg/dL (ref 20–275)
Microalb Creat Ratio: 51 mcg/mg creat — ABNORMAL HIGH (ref <30)
Microalb, Ur: 7.8 mg/dL

## 2022-04-24 DIAGNOSIS — Z1231 Encounter for screening mammogram for malignant neoplasm of breast: Secondary | ICD-10-CM | POA: Diagnosis not present

## 2022-04-24 LAB — HM MAMMOGRAPHY

## 2022-06-06 DIAGNOSIS — H25813 Combined forms of age-related cataract, bilateral: Secondary | ICD-10-CM | POA: Diagnosis not present

## 2022-06-06 DIAGNOSIS — H52203 Unspecified astigmatism, bilateral: Secondary | ICD-10-CM | POA: Diagnosis not present

## 2022-06-06 DIAGNOSIS — H524 Presbyopia: Secondary | ICD-10-CM | POA: Diagnosis not present

## 2022-07-13 ENCOUNTER — Encounter: Payer: Self-pay | Admitting: Nurse Practitioner

## 2022-07-13 ENCOUNTER — Telehealth: Payer: Self-pay

## 2022-07-13 ENCOUNTER — Ambulatory Visit (INDEPENDENT_AMBULATORY_CARE_PROVIDER_SITE_OTHER): Payer: Medicare PPO | Admitting: Nurse Practitioner

## 2022-07-13 DIAGNOSIS — Z0000 Encounter for general adult medical examination without abnormal findings: Secondary | ICD-10-CM

## 2022-07-13 NOTE — Progress Notes (Signed)
Subjective:   Danielle Pierce is a 81 y.o. female who presents for Medicare Annual (Subsequent) preventive examination.  Review of Systems     Cardiac Risk Factors include: advanced age (>52mn, >>69women);dyslipidemia;hypertension;obesity (BMI >30kg/m2)     Objective:    There were no vitals filed for this visit. There is no height or weight on file to calculate BMI.     07/13/2022   10:43 AM 03/30/2022    3:26 PM 11/28/2021   10:29 AM 11/10/2021    8:24 AM 07/29/2021   10:35 AM 07/07/2021   10:28 AM 06/21/2021    2:25 PM  Advanced Directives  Does Patient Have a Medical Advance Directive? Yes Yes No No Yes Yes Yes  Type of AParamedicof [place]Living will [place]end[place]Living will   [place] will [place]Living will [place]Living will  Does patient want to make changes to medical advance directive? No - Patient declined    No - Patient declined No - Patient declined No - Patient declined  Copy of [place]in Chart? No - copy requested No - copy requested   No - copy requested No - copy requested No - copy requested  Would patient like information on creating a medical advance directive?   No - Patient declined No - Patient declined       Current Medications (verified) Outpatient Encounter Medications as of 07/13/2022  Medication Sig   aspirin 81 MG tablet Take 81 mg by mouth daily.   atorvastatin (LIPITOR) 20 MG tablet Take 1 tablet (20 mg total) by mouth at bedtime.   Calcium Carbonate-Vitamin D (CALCIUM-VITAMIN D) 500-200 MG-UNIT per tablet Take 1 tablet by mouth daily.   Carboxymethylcell-Hypromellose 0.25-0.3 % GEL Apply 1 application to eye daily. to alleviate irritation.   Cholecalciferol (VITAMIN D-3 PO) Take 1 tablet by mouth 2 (two) times daily.   hydrocortisone (ANUSOL-HC) 25 MG suppository Place 1 suppository (25 mg total) rectally  daily as needed for hemorrhoids or anal itching.   losartan-hydrochlorothiazide (HYZAAR) 50-12.5 MG tablet Take 1 tablet by mouth daily.   TURMERIC PO Take 1 tablet by mouth as needed. Also taking turmeric liquid supplement   vitamin B-12 (CYANOCOBALAMIN) 1000 MCG tablet Take 1,000 mcg by mouth daily.   vitamin C (ASCORBIC ACID) 500 MG tablet Take 500 mg by mouth daily.   vitamin E 400 UNIT capsule Take 400 Units by mouth daily.   No facility-administered encounter medications on file as of 07/13/2022.    Allergies (verified) Patient has no known allergies.   History: Past Medical History:  Diagnosis Date   Arthritis    Ankle   Asymptomatic varicose veins    Benign neoplasm of colon    Bilateral shoulder pain 07/02/14   Cataract    Bilateral - just watching   Cervicitis and endocervicitis 09/13/2011   Disorder of bone and cartilage, unspecified    Disorders of bursae and tendons in shoulder region, unspecified    External hemorrhoids without mention of complication    Fibrocystic breast    Generalized osteoarthrosis, unspecified site    GERD (gastroesophageal reflux disease)    diet controlled, No meds   Hiatal hernia    Hypertension    Impacted cerumen 11/07/2011   Obesity, unspecified    Other diseases of nasal cavity and sinuses(478.19)    Pain in joint, ankle and foot    Pain in joint, pelvic region  and thigh    Pain in joint, shoulder region    Reflux esophagitis    SVD (spontaneous vaginal delivery)    x 2   Unspecified essential hypertension    Unspecified vitamin D deficiency    Past Surgical History:  Procedure Laterality Date   COLONOSCOPY  2006   Dr.Orr   COLONOSCOPY  07/08/2013   Dr. Carlean Purl   FLEXIBLE SIGMOIDOSCOPY  1990   Hemorrhoids    MOUTH SURGERY  2005   DR LUTINS - tooth ext and gum surgery   SHOULDER ARTHROSCOPY W/ ACROMIAL REPAIR Right 07/02/14   Dr. Durward Fortes   UPPER GASTROINTESTINAL ENDOSCOPY     normal   Family History  Problem  Relation Age of Onset   Hypertension Mother    Kidney disease Mother        Renal failure   Cancer Brother        Lymphoma   ADD / ADHD Son    Seizures Son    Hypertension Sister    Dementia Sister    Hypertension Sister    Hypertension Sister    Cancer - Other Sister    Early death Brother        Some type of accident   Colon cancer Father 68   Stomach cancer Neg Hx    Rectal cancer Neg Hx    Social History   Socioeconomic History   Marital status: Widowed    Spouse name: Not on file   Number of children: Not on file   Years of education: Not on file   Highest education level: Not on file  Occupational History   Occupation: retired Landscape architect  Tobacco Use   Smoking status: Never   Smokeless tobacco: Never  Vaping Use   Vaping Use: Never used  Substance and Sexual Activity   Alcohol use: Yes    Comment: occasional mixed drink    Drug use: No   Sexual activity: Not Currently    Birth control/protection: Post-menopausal  Other Topics Concern   Not on file  Social History Narrative   Married    Never smoked   Alcohlol none   Exercise treadmill 3 times a week    Social Determinants of Health   Financial Resource Strain: Low Risk  (06/13/2018)   Overall Financial Resource Strain (CARDIA)    Difficulty of Paying Living Expenses: Not hard at all  Food Insecurity: No Food Insecurity (06/13/2018)   Hunger Vital Sign    Worried About Running Out of Food in the Last Year: Never true    [place] in the Last Year: Never true  Transportation Needs: No Transportation Needs (06/13/2018)   PRAPARE - Hydrologist (Medical): No    Lack of Transportation (Non-Medical): No  Physical Activity: Inactive (06/13/2018)   Exercise Vital Sign    Days of Exercise per Week: 0 days    Minutes of Exercise per Session: 0 min  Stress: Stress Concern Present (06/13/2018)   [place]    Feeling of Stress : To some extent  Social Connections: Moderately Integrated (06/13/2018)   Social Connection and Isolation Panel [NHANES]    Frequency of Communication with Friends and Family: More than three times a week    Frequency of Social Gatherings with Friends and Family: More than three times a week    Attends Religious Services: More than 4 times per year  Active Member of Clubs or Organizations: No    Attends Archivist Meetings: Never    Marital Status: Married    Tobacco Counseling Counseling given: Not Answered   Clinical Intake:  Pre-visit preparation completed: Yes  Pain : No/denies pain     BMI - recorded: 48 Nutritional Status: BMI > 30  Obese Diabetes: No  How often do you need to have someone help you when you read instructions, pamphlets, or other written materials from your doctor or pharmacy?: 1 - Never  Diabetic?no         Activities of Daily Living    07/13/2022   10:55 AM  In your present state of health, do you have any difficulty performing the following activities:  Hearing? 0  Vision? 0  Difficulty concentrating or making decisions? 0  Walking or climbing stairs? 0  Dressing or bathing? 0  Doing errands, shopping? 0  Preparing Food and eating ? N  Using the Toilet? N  In the past six months, have you accidently leaked urine? N  Do you have problems with loss of bowel control? N  Managing your Medications? N  Managing your Finances? N  Housekeeping or managing your Housekeeping? N    Patient Care Team: Lauree Chandler, NP as PCP - General (Geriatric Medicine) Garald Balding, MD as Consulting Physician (Orthopedic Surgery) Rutherford Guys, MD as Consulting Physician (Ophthalmology) Druscilla Brownie, MD as Consulting Physician (Dermatology) Gatha Mayer, MD as Consulting Physician (Gastroenterology)  Indicate any recent Medical Services you may have received from other than [facility] providers  in the past year (date may be approximate).     Assessment:   This is a routine wellness examination for Sherleen.  Hearing/Vision screen Hearing Screening - Comments:: Patient has no hearing problems  Dietary issues and exercise activities discussed: Current Exercise Habits: Home exercise routine, Type of exercise: treadmill, Time (Minutes): 30, Frequency (Times/Week): 3, Weekly Exercise (Minutes/Week): 90   Goals Addressed   None    Depression Screen    07/13/2022   10:41 AM 03/31/2022   10:09 AM 11/28/2021   10:29 AM 07/07/2021   10:25 AM 08/19/2020    8:55 AM 07/02/2020    9:47 AM 04/19/2020    3:01 PM  PHQ 2/9 Scores  PHQ - 2 Score 0 0 0 0 0 0 0    Fall Risk    07/13/2022   10:42 AM 03/31/2022   10:09 AM 11/28/2021   10:28 AM 11/10/2021    8:24 AM 07/07/2021   10:26 AM  Fall Risk   Falls in the past year? 0 0 0 0 0  Number falls in past yr: 0 0 0 0 0  Injury with Fall? 0 0 0 0 0  Risk for fall due to : No Fall Risks No Fall Risks No Fall Risks No Fall Risks No Fall Risks  Follow up Falls evaluation completed Falls evaluation completed Falls evaluation completed Falls evaluation completed Falls evaluation completed    FALL RISK PREVENTION PERTAINING TO THE HOME:  Any stairs in or around the home? Yes  If so, are there any without handrails? No  Home free of loose throw rugs in walkways, pet beds, electrical cords, etc? Yes  Adequate lighting in your home to reduce risk of falls? Yes   ASSISTIVE DEVICES UTILIZED TO PREVENT FALLS:  Life alert? No  Use of a cane, walker or w/c? No  Grab bars in the bathroom? Yes  Shower chair or bench  in shower? No  Elevated toilet seat or a handicapped toilet? Yes   TIMED UP AND GO:  Was the test performed? No .   Cognitive Function:    06/24/2019    9:37 AM 06/13/2018    8:51 AM 11/08/2016   11:29 AM  MMSE - Mini Mental State Exam  Orientation to time '5 5 5  '$ Orientation to Place '5 5 5  '$ Registration '3 3 3  '$ Attention/  Calculation '5 5 5  '$ Recall 0 2 2  Language- name 2 objects '2 2 2  '$ Language- repeat '1 1 1  '$ Language- follow 3 step command '3 3 3  '$ Language- read & follow direction '1 1 1  '$ Write a sentence '1 1 1  '$ Copy design 1 0 1  Total score '27 28 29        '$ 07/13/2022   10:43 AM 07/07/2021   10:28 AM 07/02/2020    9:49 AM  6CIT Screen  What Year? 0 points 0 points 0 points  What month? 0 points 0 points 0 points  What time? 0 points 0 points 0 points  Count back from 20 0 points 0 points 0 points  Months in reverse 0 points 0 points 0 points  Repeat phrase 0 points 0 points 2 points  Total Score 0 points 0 points 2 points    Immunizations Immunization History  Administered Date(s) Administered   Fluad Quad(high Dose 65+) 06/24/2019, 08/19/2020, 07/29/2021   Influenza, High Dose Seasonal PF 09/17/2017, 06/17/2018   PFIZER Comirnaty(Gray Top)Covid-19 Tri-Sucrose Vaccine 01/31/2021   PFIZER(Purple Top)SARS-COV-2 Vaccination 11/08/2019, 11/29/2019, 07/30/2020   PPD Test 10/02/1997   Pfizer Covid-19 Vaccine Bivalent Booster 29yr & up 07/15/2021   Pneumococcal Conjugate-13 05/10/2017   Pneumococcal Polysaccharide-23 06/13/2018   Td 04/30/1998   Tdap 04/19/2020    TDAP status: Up to date  Flu Vaccine status: Due, Education has been provided regarding the importance of this vaccine. Advised may receive this vaccine at local pharmacy or Health Dept. Aware to provide a copy of the vaccination record if obtained from local pharmacy or Health Dept. Verbalized acceptance and understanding.  Pneumococcal vaccine status: Up to date  Covid-19 vaccine status: Information provided on how to obtain vaccines.   Qualifies for Shingles Vaccine? Yes   Zostavax completed No   Shingrix Completed?: No.    Education has been provided regarding the importance of this vaccine. Patient has been advised to call insurance company to determine out of pocket expense if they have not yet received this vaccine. Advised  may also receive vaccine at local pharmacy or Health Dept. Verbalized acceptance and understanding.  Screening Tests Health Maintenance  Topic Date Due   Zoster Vaccines- Shingrix (1 of 2) Never done   COVID-19 Vaccine (6 - Pfizer risk series) 09/09/2021   OPHTHALMOLOGY EXAM  05/30/2022   FOOT EXAM  07/29/2022   HEMOGLOBIN A1C  09/26/2022   Diabetic kidney evaluation - GFR measurement  03/28/2023   Diabetic kidney evaluation - Urine ACR  04/01/2023   TETANUS/TDAP  04/19/2030   Pneumonia Vaccine 80 Years old  Completed   DEXA SCAN  Completed   Hepatitis C Screening  Completed   HPV VACCINES  Aged Out    Health Maintenance  Health Maintenance Due  Topic Date Due   Zoster Vaccines- Shingrix (1 of 2) Never done   COVID-19 Vaccine (6 - PChamberlaynerisk series) 09/09/2021   OPHTHALMOLOGY EXAM  05/30/2022    Colorectal cancer screening: No longer required.  Mammogram status: No longer required due to aged.  Bone Density status: Completed 7/22. Results reflect: Bone density results: OSTEOPENIA. Repeat every 2 years.  Lung Cancer Screening: (Low Dose CT Chest recommended if Age 64-80 years, 30 pack-year currently smoking OR have quit w/in 15years.) does not qualify.   Lung Cancer Screening Referral: na  Additional Screening:  Hepatitis C Screening: does qualify; Completed 2021  Vision Screening: Recommended annual ophthalmology exams for early detection of glaucoma and other disorders of the eye. Is the patient up to date with their annual eye exam?  Yes  Who is the provider or what is the name of the office in which the patient attends annual eye exams? [facility] If pt is not established with a provider, would they like to be referred to a provider to establish care? No .   Dental Screening: Recommended annual dental exams for proper oral hygiene  Community Resource Referral / Chronic Care Management: CRR required this visit?  No   CCM required this visit?  No      Plan:      I have personally reviewed and noted the following in the patient's chart:   Medical and social history Use of alcohol, tobacco or illicit drugs  Current medications and supplements including opioid prescriptions. Patient is not currently taking opioid prescriptions. Functional ability and status Nutritional status Physical activity Advanced directives List of other physicians Hospitalizations, surgeries, and ER visits in previous 12 months Vitals Screenings to include cognitive, depression, and falls Referrals and appointments  In addition, I have reviewed and discussed with patient certain preventive protocols, quality metrics, and best practice recommendations. A written personalized care plan for preventive services as well as general preventive health recommendations were provided to patient.     Lauree Chandler, NP   07/13/2022   Virtual Visit via Telephone Note  I connected with patient 07/13/22 at 11:00 AM EDT by telephone and verified that I am speaking with the correct person using two identifiers.  Location: Patient: home Provider: [facility]   I discussed the limitations, risks, security and privacy concerns of performing an evaluation and management service by telephone and the availability of in person appointments. I also discussed with the patient that there may be a patient responsible charge related to this service. The patient expressed understanding and agreed to proceed.   I discussed the assessment and treatment plan with the patient. The patient was provided an opportunity to ask questions and all were answered. The patient agreed with the plan and demonstrated an understanding of the instructions.   The patient was advised to call back or seek an in-person evaluation if the symptoms worsen or if the condition fails to improve as anticipated.  I provided 15 minutes of non-face-to-face time during this encounter.  Carlos American. Harle Battiest Avs printed  and mailed

## 2022-07-13 NOTE — Progress Notes (Signed)
This service is provided via telemedicine  No vital signs collected/recorded due to the encounter was a telemedicine visit.   Location of patient (ex: home, work):  Home  Patient consents to a telephone visit:  Yes, see encounter dated 07/13/2022  Location of the provider (ex: office, home):  [place]  Name of any referring provider:  N/A  Names of all persons participating in the telemedicine service and their role in the encounter:  Sherrie Mustache, Nurse Practitioner, Carroll Kinds, CMA, and patient.   Time spent on call:  12 minutes with medical assistant

## 2022-07-13 NOTE — Patient Instructions (Signed)
Danielle Pierce , Thank you for taking time to come for your Medicare Wellness Visit. I appreciate your ongoing commitment to your health goals. Please review the following plan we discussed and let me know if I can assist you in the future.   Screening recommendations/referrals: Colonoscopy -aged out Mammogram aged out Bone Density up to date Recommended yearly ophthalmology/optometry visit for glaucoma screening and checkup Recommended yearly dental visit for hygiene and checkup  Vaccinations: Influenza vaccine- due annually in September/October Pneumococcal vaccine up to date Tdap vaccine up to date Shingles vaccine DUE- recommend to get at your local pharmacy     Advanced directives: to bring to office to place on file.   Conditions/risks identified: advance age, obesity  Next appointment: yearly- next in person   Preventive Care 48 Years and Older, Female Preventive care refers to lifestyle choices and visits with your health care provider that can promote health and wellness. What does preventive care include? A yearly physical exam. This is also called an annual well check. Dental exams once or twice a year. Routine eye exams. Ask your health care provider how often you should have your eyes checked. Personal lifestyle choices, including: Daily care of your teeth and gums. Regular physical activity. Eating a healthy diet. Avoiding tobacco and drug use. Limiting alcohol use. Practicing safe sex. Taking low-dose aspirin every day. Taking vitamin and mineral supplements as recommended by your health care provider. What happens during an annual well check? The services and screenings done by your health care provider during your annual well check will depend on your age, overall health, lifestyle risk factors, and family history of disease. Counseling  Your health care provider may ask you questions about your: Alcohol use. Tobacco use. Drug use. Emotional  well-being. Home and relationship well-being. Sexual activity. Eating habits. History of falls. Memory and ability to understand (cognition). Work and work Statistician. Reproductive health. Screening  You may have the following tests or measurements: Height, weight, and BMI. Blood pressure. Lipid and cholesterol levels. These may be checked every 5 years, or more frequently if you are over 71 years old. Skin check. Lung cancer screening. You may have this screening every year starting at age 30 if you have a 30-pack-year history of smoking and currently smoke or have quit within the past 15 years. Fecal occult blood test (FOBT) of the stool. You may have this test every year starting at age 52. Flexible sigmoidoscopy or colonoscopy. You may have a sigmoidoscopy every 5 years or a colonoscopy every 10 years starting at age 46. Hepatitis C blood test. Hepatitis B blood test. Sexually transmitted disease (STD) testing. Diabetes screening. This is done by checking your blood sugar (glucose) after you have not eaten for a while (fasting). You may have this done every 1-3 years. Bone density scan. This is done to screen for osteoporosis. You may have this done starting at age 45. Mammogram. This may be done every 1-2 years. Talk to your health care provider about how often you should have regular mammograms. Talk with your health care provider about your test results, treatment options, and if necessary, the need for more tests. Vaccines  Your health care provider may recommend certain vaccines, such as: Influenza vaccine. This is recommended every year. Tetanus, diphtheria, and acellular pertussis (Tdap, Td) vaccine. You may need a Td booster every 10 years. Zoster vaccine. You may need this after age 29. Pneumococcal 13-valent conjugate (PCV13) vaccine. One dose is recommended after age 48. Pneumococcal polysaccharide (  PPSV23) vaccine. One dose is recommended after age 104. Talk to your  health care provider about which screenings and vaccines you need and how often you need them. This information is not intended to replace advice given to you by your health care provider. Make sure you discuss any questions you have with your health care provider. Document Released: 10/15/2015 Document Revised: 06/07/2016 Document Reviewed: 07/20/2015 Elsevier Interactive Patient Education  2017 [place] Prevention in the Home Falls can cause injuries. They can happen to people of all ages. There are many things you can do to make your home safe and to help prevent falls. What can I do on the outside of my home? Regularly fix the edges of walkways and driveways and fix any cracks. Remove anything that might make you trip as you walk through a door, such as a raised step or threshold. Trim any bushes or trees on the path to your home. Use bright outdoor lighting. Clear any walking paths of anything that might make someone trip, such as rocks or tools. Regularly check to see if handrails are loose or broken. Make sure that both sides of any steps have handrails. Any raised decks and porches should have guardrails on the edges. Have any leaves, snow, or ice cleared regularly. Use sand or salt on walking paths during winter. Clean up any spills in your garage right away. This includes oil or grease spills. What can I do in the bathroom? Use night lights. Install grab bars by the toilet and in the tub and shower. Do not use towel bars as grab bars. Use non-skid mats or decals in the tub or shower. If you need to sit down in the shower, use a plastic, non-slip stool. Keep the floor dry. Clean up any water that spills on the floor as soon as it happens. Remove soap buildup in the tub or shower regularly. Attach bath mats securely with double-sided non-slip rug tape. Do not have throw rugs and other things on the floor that can make you trip. What can I do in the bedroom? Use night  lights. Make sure that you have a light by your bed that is easy to reach. Do not use any sheets or blankets that are too big for your bed. They should not hang down onto the floor. Have a firm chair that has side arms. You can use this for support while you get dressed. Do not have throw rugs and other things on the floor that can make you trip. What can I do in the kitchen? Clean up any spills right away. Avoid walking on wet floors. Keep items that you use a lot in easy-to-reach places. If you need to reach something above you, use a strong step stool that has a grab bar. Keep electrical cords out of the way. Do not use floor polish or wax that makes floors slippery. If you must use wax, use non-skid floor wax. Do not have throw rugs and other things on the floor that can make you trip. What can I do with my stairs? Do not leave any items on the stairs. Make sure that there are handrails on both sides of the stairs and use them. Fix handrails that are broken or loose. Make sure that handrails are as long as the stairways. Check any carpeting to make sure that it is firmly attached to the stairs. Fix any carpet that is loose or worn. Avoid having throw rugs at the top or  bottom of the stairs. If you do have throw rugs, attach them to the floor with carpet tape. Make sure that you have a light switch at the top of the stairs and the bottom of the stairs. If you do not have them, ask someone to add them for you. What else can I do to help prevent falls? Wear shoes that: Do not have high heels. Have rubber bottoms. Are comfortable and fit you well. Are closed at the toe. Do not wear sandals. If you use a stepladder: Make sure that it is fully opened. Do not climb a closed stepladder. Make sure that both sides of the stepladder are locked into place. Ask someone to hold it for you, if possible. Clearly mark and make sure that you can see: Any grab bars or handrails. First and last  steps. Where the edge of each step is. Use tools that help you move around (mobility aids) if they are needed. These include: Canes. Walkers. Scooters. Crutches. Turn on the lights when you go into a dark area. Replace any light bulbs as soon as they burn out. Set up your furniture so you have a clear path. Avoid moving your furniture around. If any of your floors are uneven, fix them. If there are any pets around you, be aware of where they are. Review your medicines with your doctor. Some medicines can make you feel dizzy. This can increase your chance of falling. Ask your doctor what other things that you can do to help prevent falls. This information is not intended to replace advice given to you by your health care provider. Make sure you discuss any questions you have with your health care provider. Document Released: 07/15/2009 Document Revised: 02/24/2016 Document Reviewed: 10/23/2014 Elsevier Interactive Patient Education  2017 [employer].

## 2022-07-13 NOTE — Telephone Encounter (Signed)
Ms. rafaella, kole are scheduled for a virtual visit with your provider today.    Just as we do with appointments in the office, we must obtain your consent to participate.  Your consent will be active for this visit and any virtual visit you may have with one of our providers in the next 365 days.    If you have a MyChart account, I can also send a copy of this consent to you electronically.  All virtual visits are billed to your insurance company just like a traditional visit in the office.  As this is a virtual visit, video technology does not allow for your provider to perform a traditional examination.  This may limit your provider's ability to fully assess your condition.  If your provider identifies any concerns that need to be evaluated in person or the need to arrange testing such as labs, EKG, etc, we will make arrangements to do so.    Although advances in technology are sophisticated, we cannot ensure that it will always work on either your end or our end.  If the connection with a video visit is poor, we may have to switch to a telephone visit.  With either a video or telephone visit, we are not always able to ensure that we have a secure connection.   I need to obtain your verbal consent now.   Are you willing to proceed with your visit today?   ERINNE GILLENTINE has provided verbal consent on 07/13/2022 for a virtual visit (video or telephone).   Carroll Kinds, CMA 07/13/2022  10:50 AM

## 2022-07-24 ENCOUNTER — Ambulatory Visit (INDEPENDENT_AMBULATORY_CARE_PROVIDER_SITE_OTHER): Payer: Medicare PPO | Admitting: Nurse Practitioner

## 2022-07-24 DIAGNOSIS — Z23 Encounter for immunization: Secondary | ICD-10-CM

## 2022-10-05 ENCOUNTER — Other Ambulatory Visit: Payer: Medicare PPO

## 2022-10-05 DIAGNOSIS — E1122 Type 2 diabetes mellitus with diabetic chronic kidney disease: Secondary | ICD-10-CM

## 2022-10-05 DIAGNOSIS — N1831 Chronic kidney disease, stage 3a: Secondary | ICD-10-CM | POA: Diagnosis not present

## 2022-10-05 DIAGNOSIS — E785 Hyperlipidemia, unspecified: Secondary | ICD-10-CM | POA: Diagnosis not present

## 2022-10-05 DIAGNOSIS — I10 Essential (primary) hypertension: Secondary | ICD-10-CM | POA: Diagnosis not present

## 2022-10-06 LAB — CBC WITH DIFFERENTIAL/PLATELET
Absolute Monocytes: 585 cells/uL (ref 200–950)
Basophils Absolute: 38 cells/uL (ref 0–200)
Basophils Relative: 0.5 %
Eosinophils Absolute: 167 cells/uL (ref 15–500)
Eosinophils Relative: 2.2 %
HCT: 36.6 % (ref 35.0–45.0)
Hemoglobin: 12.5 g/dL (ref 11.7–15.5)
Lymphs Abs: 2668 cells/uL (ref 850–3900)
MCH: 31.4 pg (ref 27.0–33.0)
MCHC: 34.2 g/dL (ref 32.0–36.0)
MCV: 92 fL (ref 80.0–100.0)
MPV: 10.5 fL (ref 7.5–12.5)
Monocytes Relative: 7.7 %
Neutro Abs: 4142 cells/uL (ref 1500–7800)
Neutrophils Relative %: 54.5 %
Platelets: 210 10*3/uL (ref 140–400)
RBC: 3.98 10*6/uL (ref 3.80–5.10)
RDW: 13.7 % (ref 11.0–15.0)
Total Lymphocyte: 35.1 %
WBC: 7.6 10*3/uL (ref 3.8–10.8)

## 2022-10-06 LAB — COMPLETE METABOLIC PANEL WITH GFR
AG Ratio: 1.8 (calc) (ref 1.0–2.5)
ALT: 16 U/L (ref 6–29)
AST: 25 U/L (ref 10–35)
Albumin: 4.1 g/dL (ref 3.6–5.1)
Alkaline phosphatase (APISO): 87 U/L (ref 37–153)
BUN/Creatinine Ratio: 14 (calc) (ref 6–22)
BUN: 15 mg/dL (ref 7–25)
CO2: 27 mmol/L (ref 20–32)
Calcium: 9.3 mg/dL (ref 8.6–10.4)
Chloride: 106 mmol/L (ref 98–110)
Creat: 1.08 mg/dL — ABNORMAL HIGH (ref 0.60–0.95)
Globulin: 2.3 g/dL (calc) (ref 1.9–3.7)
Glucose, Bld: 102 mg/dL — ABNORMAL HIGH (ref 65–99)
Potassium: 3.9 mmol/L (ref 3.5–5.3)
Sodium: 140 mmol/L (ref 135–146)
Total Bilirubin: 0.7 mg/dL (ref 0.2–1.2)
Total Protein: 6.4 g/dL (ref 6.1–8.1)
eGFR: 52 mL/min/{1.73_m2} — ABNORMAL LOW (ref >=60)

## 2022-10-06 LAB — LIPID PANEL
Cholesterol: 158 mg/dL (ref <200)
HDL: 63 mg/dL (ref >=50)
LDL Cholesterol (Calc): 81 mg/dL (calc)
Non-HDL Cholesterol (Calc): 95 mg/dL (calc) (ref <130)
Total CHOL/HDL Ratio: 2.5 (calc) (ref <5.0)
Triglycerides: 56 mg/dL (ref <150)

## 2022-10-06 LAB — HEMOGLOBIN A1C
Hgb A1c MFr Bld: 6.3 % of total Hgb — ABNORMAL HIGH (ref <5.7)
Mean Plasma Glucose: 134 mg/dL
eAG (mmol/L): 7.4 mmol/L

## 2022-10-12 ENCOUNTER — Encounter: Payer: Self-pay | Admitting: Nurse Practitioner

## 2022-10-13 ENCOUNTER — Ambulatory Visit: Payer: Medicare PPO | Admitting: Nurse Practitioner

## 2022-10-16 ENCOUNTER — Encounter: Payer: Self-pay | Admitting: Nurse Practitioner

## 2022-10-16 ENCOUNTER — Ambulatory Visit: Payer: Medicare PPO | Admitting: Nurse Practitioner

## 2022-10-16 VITALS — BP 142/92 | HR 61 | Temp 97.6°F | Resp 17 | Ht 63.0 in | Wt 248.0 lb

## 2022-10-16 DIAGNOSIS — E785 Hyperlipidemia, unspecified: Secondary | ICD-10-CM | POA: Diagnosis not present

## 2022-10-16 DIAGNOSIS — E1122 Type 2 diabetes mellitus with diabetic chronic kidney disease: Secondary | ICD-10-CM

## 2022-10-16 DIAGNOSIS — I10 Essential (primary) hypertension: Secondary | ICD-10-CM | POA: Diagnosis not present

## 2022-10-16 DIAGNOSIS — Z6841 Body Mass Index (BMI) 40.0 and over, adult: Secondary | ICD-10-CM | POA: Diagnosis not present

## 2022-10-16 DIAGNOSIS — N1831 Chronic kidney disease, stage 3a: Secondary | ICD-10-CM

## 2022-10-16 NOTE — Progress Notes (Signed)
Careteam: Patient Care Team: Lauree Chandler, NP as PCP - General (Geriatric Medicine) Garald Balding, MD as Consulting Physician (Orthopedic Surgery) Rutherford Guys, MD as Consulting Physician (Ophthalmology) Druscilla Brownie, MD as Consulting Physician (Dermatology) Gatha Mayer, MD as Consulting Physician (Gastroenterology)  PLACE OF SERVICE:  [facility] Directive information Does Patient Have a Medical Advance Directive?: Yes, Type of Advance Directive: Living will;Healthcare Power of Attorney, Does patient want to make changes to medical advance directive?: No - Patient declined  No Known Allergies  Chief Complaint  Patient presents with   Medical Management of Chronic Issues    6 month follow-up, foot exam, and discuss labs. Discuss need for eye exam, shingrix, and covid boosters or post pone if patient refuses. NVIR verified.      HPI: Patient is a 82 y.o. female is here for a routine visit.   BP slightly elevated today, she did not take her medicine this morning. She hasn't been checking her BP at home. She tries to avoid salty foods. She has been going to the gym 3x a week. She reports she is fairly active in her life and stays busy with activities such as baking and volunteering. She feels that she did eat a lot of 'bad' food over the holidays and could use some assistance figuring out what to eat.   Denies chest pain, shortness of breath, numbness and tingling.   She had an eye exam last July. She will go again over the summer. She is due for her diabetic foot exam today. No neuropathy noted.    Review of Systems:  Review of Systems  Constitutional:  Negative for chills, fever, malaise/fatigue and weight loss.  HENT:  Negative for congestion and sore throat.   Eyes:  Negative for blurred vision.  Respiratory:  Negative for cough, shortness of breath and wheezing.   Cardiovascular:  Negative for chest pain, palpitations and leg swelling.   Gastrointestinal:  Negative for abdominal pain, blood in stool, constipation, diarrhea, heartburn, nausea and vomiting.  Genitourinary:  Negative for dysuria, frequency, hematuria and urgency.  Musculoskeletal:  Negative for falls and joint pain.  Skin:  Negative for rash.  Neurological:  Negative for dizziness, tingling and headaches.  Endo/Heme/Allergies:  Negative for polydipsia.  Psychiatric/Behavioral:  Negative for depression. The patient is not nervous/anxious.     Past Medical History:  Diagnosis Date   Arthritis    Ankle   Asymptomatic varicose veins    Benign neoplasm of colon    Bilateral shoulder pain 07/02/14   Cataract    Bilateral - just watching   Cervicitis and endocervicitis 09/13/2011   Disorder of bone and cartilage, unspecified    Disorders of bursae and tendons in shoulder region, unspecified    External hemorrhoids without mention of complication    Fibrocystic breast    Generalized osteoarthrosis, unspecified site    GERD (gastroesophageal reflux disease)    diet controlled, No meds   Hiatal hernia    Hypertension    Impacted cerumen 11/07/2011   Obesity, unspecified    Other diseases of nasal cavity and sinuses(478.19)    Pain in joint, ankle and foot    Pain in joint, pelvic region and thigh    Pain in joint, shoulder region    Reflux esophagitis    SVD (spontaneous vaginal delivery)    x 2   Unspecified essential hypertension    Unspecified vitamin D deficiency    Past Surgical History:  Procedure Laterality Date   COLONOSCOPY  2006   Dr.Orr   COLONOSCOPY  07/08/2013   Dr. Carlean Purl   FLEXIBLE SIGMOIDOSCOPY  1990   Hemorrhoids    MOUTH SURGERY  2005   DR LUTINS - tooth ext and gum surgery   SHOULDER ARTHROSCOPY W/ ACROMIAL REPAIR Right 07/02/14   Dr. Durward Fortes   UPPER GASTROINTESTINAL ENDOSCOPY     normal   Social History:   reports that she has never smoked. She has never used smokeless tobacco. She reports current alcohol use. She  reports that she does not use drugs.  Family History  Problem Relation Age of Onset   Hypertension Mother    Kidney disease Mother        Renal failure   Cancer Brother        Lymphoma   ADD / ADHD Son    Seizures Son    Hypertension Sister    Dementia Sister    Hypertension Sister    Hypertension Sister    Cancer - Other Sister    Early death Brother        Some type of accident   Colon cancer Father 36   Stomach cancer Neg Hx    Rectal cancer Neg Hx     Medications: Patient's Medications  New Prescriptions   No medications on file  Previous Medications   ASPIRIN 81 MG TABLET    Take 81 mg by mouth daily.   ATORVASTATIN (LIPITOR) 20 MG TABLET    Take 1 tablet (20 mg total) by mouth at bedtime.   CALCIUM CARBONATE-VITAMIN D (CALCIUM-VITAMIN D) 500-200 MG-UNIT PER TABLET    Take 1 tablet by mouth daily.   CARBOXYMETHYLCELL-HYPROMELLOSE 0.25-0.3 % GEL    Apply 1 application to eye daily. to alleviate irritation.   CHOLECALCIFEROL (VITAMIN D-3 PO)    Take 1 tablet by mouth 2 (two) times daily.   HYDROCORTISONE (ANUSOL-HC) 25 MG SUPPOSITORY    Place 1 suppository (25 mg total) rectally daily as needed for hemorrhoids or anal itching.   LOSARTAN-HYDROCHLOROTHIAZIDE (HYZAAR) 50-12.5 MG TABLET    Take 1 tablet by mouth daily.   TURMERIC PO    Take 1 tablet by mouth as needed. Also taking turmeric liquid supplement   VITAMIN B-12 (CYANOCOBALAMIN) 1000 MCG TABLET    Take 1,000 mcg by mouth daily.   VITAMIN C (ASCORBIC ACID) 500 MG TABLET    Take 500 mg by mouth daily.   VITAMIN E 400 UNIT CAPSULE    Take 400 Units by mouth daily.  Modified Medications   No medications on file  Discontinued Medications   No medications on file    Physical Exam:  Vitals:   10/16/22 1020  Pulse: 61  Resp: 17  Temp: 97.6 F (36.4 C)  TempSrc: Temporal  SpO2: 99%  Weight: 248 lb (112.5 kg)  Height: '5\' 3"'$  (1.6 m)   Body mass index is 43.93 kg/m. Wt Readings from Last 3 Encounters:   10/16/22 248 lb (112.5 kg)  03/31/22 248 lb (112.5 kg)  11/28/21 248 lb (112.5 kg)    Physical Exam Vitals and nursing note reviewed.  Constitutional:      General: She is not in acute distress.    Appearance: Normal appearance. She is obese.  Cardiovascular:     Rate and Rhythm: Normal rate and regular rhythm.  Pulmonary:     Effort: No respiratory distress.     Breath sounds: Normal breath sounds.  Abdominal:  General: Bowel sounds are normal. There is no distension.     Palpations: Abdomen is soft. There is no mass.     Tenderness: There is no abdominal tenderness. There is no guarding.  Musculoskeletal:     Cervical back: Neck supple.  Lymphadenopathy:     Cervical: No cervical adenopathy.  Skin:    General: Skin is warm and dry.  Neurological:     Mental Status: She is alert and oriented to person, place, and time.  Psychiatric:        Mood and Affect: Mood normal.     Labs reviewed: Basic Metabolic Panel: Recent Labs    11/25/21 0831 03/27/22 0858 10/05/22 0933  NA 144 141 140  K 3.9 4.1 3.9  CL 108 105 106  CO2 '28 28 27  '$ GLUCOSE 105* 103* 102*  BUN '15 13 15  '$ CREATININE 1.09* 1.06* 1.08*  CALCIUM 9.6 10.0 9.3   Liver Function Tests: Recent Labs    11/25/21 0831 03/27/22 0858 10/05/22 0933  AST '20 28 25  '$ ALT '10 17 16  '$ BILITOT 0.6 0.9 0.7  PROT 6.4 6.3 6.4   No results for input(s): "LIPASE", "AMYLASE" in the last 8760 hours. No results for input(s): "AMMONIA" in the last 8760 hours. CBC: Recent Labs    11/25/21 0831 10/05/22 0933  WBC 8.8 7.6  NEUTROABS 4,198 4,142  HGB 13.4 12.5  HCT 40.5 36.6  MCV 94.8 92.0  PLT 209 210   Lipid Panel: Recent Labs    11/25/21 0831 03/27/22 0858 10/05/22 0933  CHOL 156 144 158  HDL 52 60 63  LDLCALC 89 69 81  TRIG 65 71 56  CHOLHDL 3.0 2.4 2.5   TSH: No results for input(s): "TSH" in the last 8760 hours. A1C: Lab Results  Component Value Date   HGBA1C 6.3 (H) 10/05/2022      Assessment/Plan 1. Type 2 diabetes mellitus with stage 3a chronic kidney disease, without long-term current use of insulin ([place]) Discussed dietary modifications in detail. Specifically small, simple changes such as reducing sugary, sweet drinks and junk food/snacks. Offered referral to dietician to patient but she declines at this time. Continue with routine exercise. - Hemoglobin A1c; Future  2. Class 3 severe obesity due to excess calories with serious comorbidity and body mass index (BMI) of 45.0 to 49.9 in adult [facility]) Discussed continuing exercise and remaining active. Encouraged dietary modifications with patient. She declines referral to dietary at this time but is open to the idea.   3. Hyperlipidemia, unspecified hyperlipidemia type At goal. Continue lipitor, avoid fried and fatty foods.  - Lipid panel; Future  4. Essential hypertension Slightly elevated today but patient did not take her medication this morning. Blood pressures well-controlled in the past. Encouraged to check BP at home regularly in order to monitor more closely.  - COMPLETE METABOLIC PANEL WITH GFR; Future - CBC with Differential/Platelet; Future  5. Stage 3a chronic kidney disease (HCC) Stable. Patient maintains good hydration. Avoid NSAIDS and adjust medications as needed for creatinine clearance.  Return in about 6 months (around 04/16/2023) for routine follow up- labs prior to appt.:    Student- Archer Asa O'Berry ACPCNP-S  I personally was present during the history, physical exam and medical decision-making activities of this service and have verified that the service and findings are accurately documented in the student's note Roselani Grajeda K. [place], [place] [facility] [phone]

## 2022-10-26 ENCOUNTER — Ambulatory Visit: Payer: Medicare PPO | Admitting: Family

## 2022-10-26 ENCOUNTER — Encounter: Payer: Self-pay | Admitting: Family

## 2022-10-26 VITALS — BP 110/70 | HR 73 | Temp 96.9°F | Resp 18 | Ht 63.0 in | Wt 248.2 lb

## 2022-10-26 DIAGNOSIS — R140 Abdominal distension (gaseous): Secondary | ICD-10-CM | POA: Diagnosis not present

## 2022-10-26 DIAGNOSIS — R109 Unspecified abdominal pain: Secondary | ICD-10-CM | POA: Diagnosis not present

## 2022-10-26 LAB — POCT URINALYSIS DIP (MANUAL ENTRY)
Bilirubin, UA: NEGATIVE
Blood, UA: NEGATIVE
Glucose, UA: NEGATIVE mg/dL
Ketones, POC UA: NEGATIVE mg/dL
Nitrite, UA: NEGATIVE
Spec Grav, UA: 1.025 (ref 1.010–1.025)
Urobilinogen, UA: 0.2 E.U./dL
pH, UA: 6 (ref 5.0–8.0)

## 2022-10-26 MED ORDER — SIMETHICONE 80 MG PO TABS: 80.0000 mg | ORAL_TABLET | Freq: Three times a day (TID) | ORAL | 3 refills | Status: DC

## 2022-10-26 NOTE — Progress Notes (Signed)
Provider: Mayrene Bastarache FNP-C  Lauree Chandler, NP  Patient Care Team: Lauree Chandler, NP as PCP - General (Geriatric Medicine) Garald Balding, MD as Consulting Physician (Orthopedic Surgery) Rutherford Guys, MD as Consulting Physician (Ophthalmology) Druscilla Brownie, MD as Consulting Physician (Dermatology) Gatha Mayer, MD as Consulting Physician (Gastroenterology)  Extended Emergency Contact Information Primary Emergency Contact: Haze Justin [place] of [place] Phone: [phone] Mobile Phone: [phone] Relation: Daughter  Code Status:  Full Code  Goals of care: Advanced Directive information    10/26/2022   10:11 AM  Advanced Directives  Does Patient Have a Medical Advance Directive? Yes  Type of Advance Directive Living will;Healthcare Power of Attorney  Does patient want to make changes to medical advance directive? No - Patient declined  Copy of [place] in Chart? No - copy requested     Chief Complaint  Patient presents with   Acute Visit    Patient complains of stomach discomfort and gas since last Friday 10/20/2022    HPI:  Pt is a 81 y.o. female seen today for an acute visit for evaluation of lower abdominal pain since 10/20/2022.described as discomfort but not pain.  Has had gas pain.Bowel movement regular.  She denies any fever ,chills,N/V.   Past Medical History:  Diagnosis Date   Arthritis    Ankle   Asymptomatic varicose veins    Benign neoplasm of colon    Bilateral shoulder pain 07/02/14   Cataract    Bilateral - just watching   Cervicitis and endocervicitis 09/13/2011   Disorder of bone and cartilage, unspecified    Disorders of bursae and tendons in shoulder region, unspecified    External hemorrhoids without mention of complication    Fibrocystic breast    Generalized osteoarthrosis, unspecified site    GERD (gastroesophageal reflux disease)    diet controlled, No meds   Hiatal hernia     Hypertension    Impacted cerumen 11/07/2011   Obesity, unspecified    Other diseases of nasal cavity and sinuses(478.19)    Pain in joint, ankle and foot    Pain in joint, pelvic region and thigh    Pain in joint, shoulder region    Reflux esophagitis    SVD (spontaneous vaginal delivery)    x 2   Unspecified essential hypertension    Unspecified vitamin D deficiency    Past Surgical History:  Procedure Laterality Date   COLONOSCOPY  2006   Dr.Orr   COLONOSCOPY  07/08/2013   Dr. Carlean Purl   FLEXIBLE SIGMOIDOSCOPY  1990   Hemorrhoids    MOUTH SURGERY  2005   DR LUTINS - tooth ext and gum surgery   SHOULDER ARTHROSCOPY W/ ACROMIAL REPAIR Right 07/02/14   Dr. Durward Fortes   UPPER GASTROINTESTINAL ENDOSCOPY     normal    No Known Allergies  Outpatient Encounter Medications as of 10/26/2022  Medication Sig   aspirin 81 MG tablet Take 81 mg by mouth daily.   atorvastatin (LIPITOR) 20 MG tablet Take 1 tablet (20 mg total) by mouth at bedtime.   Calcium Carbonate-Vitamin D (CALCIUM-VITAMIN D) 500-200 MG-UNIT per tablet Take 1 tablet by mouth daily.   Carboxymethylcell-Hypromellose 0.25-0.3 % GEL Apply 1 application to eye daily. to alleviate irritation.   Cholecalciferol (VITAMIN D-3 PO) Take 1 tablet by mouth 2 (two) times daily.   hydrocortisone (ANUSOL-HC) 25 MG suppository Place 1 suppository (25 mg total) rectally daily as needed for hemorrhoids or anal itching.  losartan-hydrochlorothiazide (HYZAAR) 50-12.5 MG tablet Take 1 tablet by mouth daily.   TURMERIC PO Take 1 tablet by mouth as needed. Also taking turmeric liquid supplement   vitamin B-12 (CYANOCOBALAMIN) 1000 MCG tablet Take 1,000 mcg by mouth daily.   vitamin C (ASCORBIC ACID) 500 MG tablet Take 500 mg by mouth daily.   vitamin E 400 UNIT capsule Take 400 Units by mouth daily.   No facility-administered encounter medications on file as of 10/26/2022.    Review of Systems  Constitutional:  Negative for appetite  change, chills, fatigue, fever and unexpected weight change.  Eyes:  Negative for pain, discharge, redness, itching and visual disturbance.  Respiratory:  Negative for cough, chest tightness, shortness of breath and wheezing.   Cardiovascular:  Negative for chest pain, palpitations and leg swelling.  Gastrointestinal:  Positive for abdominal pain. Negative for abdominal distention, constipation, diarrhea, nausea and vomiting.       Lower abdominal pain. Gas   Genitourinary:  Negative for difficulty urinating, dysuria, flank pain, frequency and urgency.  Musculoskeletal:  Positive for arthralgias and back pain. Negative for gait problem, joint swelling, myalgias, neck pain and neck stiffness.  Skin:  Negative for color change, pallor and rash.  Neurological:  Negative for dizziness, weakness, light-headedness and headaches.    Immunization History  Administered Date(s) Administered   Fluad Quad(high Dose 65+) 06/24/2019, 08/19/2020, 07/29/2021, 07/24/2022   Influenza, High Dose Seasonal PF 09/17/2017, 06/17/2018   PFIZER Comirnaty(Gray Top)Covid-19 Tri-Sucrose Vaccine 01/31/2021   PFIZER(Purple Top)SARS-COV-2 Vaccination 11/08/2019, 11/29/2019, 07/30/2020   PPD Test 10/02/1997   Pfizer Covid-19 Vaccine Bivalent Booster 21yr & up 07/15/2021   Pneumococcal Conjugate-13 05/10/2017   Pneumococcal Polysaccharide-23 06/13/2018   Td 04/30/1998   Tdap 04/19/2020   Pertinent  Health Maintenance Due  Topic Date Due   OPHTHALMOLOGY EXAM  05/30/2022   HEMOGLOBIN A1C  04/05/2023   FOOT EXAM  10/17/2023   DEXA SCAN  Completed      11/28/2021   10:28 AM 03/31/2022   10:09 AM 07/13/2022   10:42 AM 10/16/2022   10:25 AM 10/26/2022   10:11 AM  Fall Risk  Falls in the past year? 0 0 0 0 0  Was there an injury with Fall? 0 0 0 0 0  Fall Risk Category Calculator 0 0 0 0 0  Fall Risk Category (Retired) Low Low Low    (RETIRED) Patient Fall Risk Level Low fall risk Low fall risk Low fall risk     Patient at Risk for Falls Due to No Fall Risks No Fall Risks No Fall Risks History of fall(s) No Fall Risks  Fall risk Follow up Falls evaluation completed Falls evaluation completed Falls evaluation completed Falls evaluation completed Falls evaluation completed   Functional Status Survey:    Vitals:   10/26/22 1006  BP: 110/70  Pulse: 73  Resp: 18  Temp: (!) 96.9 F (36.1 C)  SpO2: 97%  Weight: 248 lb 3.2 oz (112.6 kg)  Height: '5\' 3"'$  (1.6 m)   Body mass index is 43.97 kg/m. Physical Exam Vitals reviewed.  Constitutional:      General: She is not in acute distress.    Appearance: Normal appearance. She is normal weight. She is not ill-appearing or diaphoretic.  HENT:     Head: Normocephalic.  Eyes:     General: No scleral icterus.       Right eye: No discharge.        Left eye: No discharge.  Conjunctiva/sclera: Conjunctivae normal.     Pupils: Pupils are equal, round, and reactive to light.  Cardiovascular:     Rate and Rhythm: Normal rate and regular rhythm.     Pulses: Normal pulses.     Heart sounds: Normal heart sounds. No murmur heard.    No friction rub. No gallop.  Pulmonary:     Effort: Pulmonary effort is normal. No respiratory distress.     Breath sounds: Normal breath sounds. No wheezing, rhonchi or rales.  Chest:     Chest wall: No tenderness.  Abdominal:     General: Bowel sounds are normal. There is no distension.     Palpations: Abdomen is soft. There is no mass.     Tenderness: There is abdominal tenderness in the right lower quadrant, suprapubic area and left lower quadrant. There is no right CVA tenderness, left CVA tenderness, guarding or rebound.  Musculoskeletal:        General: No swelling or tenderness. Normal range of motion.     Right lower leg: No edema.     Left lower leg: No edema.  Skin:    General: Skin is warm and dry.     Coloration: Skin is not pale.     Findings: No erythema or rash.  Neurological:     Mental Status: She  is alert and oriented to person, place, and time.     Gait: Gait normal.  Psychiatric:        Mood and Affect: Mood normal.        Speech: Speech normal.        Behavior: Behavior normal.    Labs reviewed: Recent Labs    11/25/21 0831 03/27/22 0858 10/05/22 0933  NA 144 141 140  K 3.9 4.1 3.9  CL 108 105 106  CO2 '28 28 27  '$ GLUCOSE 105* 103* 102*  BUN '15 13 15  '$ CREATININE 1.09* 1.06* 1.08*  CALCIUM 9.6 10.0 9.3   Recent Labs    11/25/21 0831 03/27/22 0858 10/05/22 0933  AST '20 28 25  '$ ALT '10 17 16  '$ BILITOT 0.6 0.9 0.7  PROT 6.4 6.3 6.4   Recent Labs    11/25/21 0831 10/05/22 0933  WBC 8.8 7.6  NEUTROABS 4,198 4,142  HGB 13.4 12.5  HCT 40.5 36.6  MCV 94.8 92.0  PLT 209 210   No results found for: "TSH" Lab Results  Component Value Date   HGBA1C 6.3 (H) 10/05/2022   Lab Results  Component Value Date   CHOL 158 10/05/2022   HDL 63 10/05/2022   LDLCALC 81 10/05/2022   TRIG 56 10/05/2022   CHOLHDL 2.5 10/05/2022    Significant Diagnostic Results in last 30 days:  No results found.  Assessment/Plan 1. Flank pain No CV tenderness - POCT urinalysis dipstick yellow cloudy urine with trace protein and moderate leukocytes 2+ but negative for blood and nitrates. Discussed results with patient willing to wait for final urine culture Advised to notify provider if symptoms worsen or running any fever or chills. - Urine Culture  2. Flatulence/gas pain/belching Has simethicone but was not clear how many times she can take in 1 day.  Will send prescription to the pharmacy. - Simethicone 80 MG TABS; Take 1 tablet (80 mg total) by mouth every 8 (eight) hours.  Dispense: 30 tablet; Refill: 3   Family/ staff Communication: Reviewed plan of care with patient verbalized understanding  Labs/tests ordered: None   Next Appointment: Return if symptoms worsen or fail  to improve.   Sandrea Hughs, NP

## 2022-10-27 ENCOUNTER — Encounter: Payer: Self-pay | Admitting: Family

## 2022-10-27 LAB — URINE CULTURE
MICRO NUMBER:: 14474173
SPECIMEN QUALITY:: ADEQUATE

## 2022-11-05 NOTE — Progress Notes (Signed)
This encounter was created in error - please disregard. Error

## 2022-12-06 ENCOUNTER — Ambulatory Visit: Payer: Self-pay

## 2022-12-06 ENCOUNTER — Ambulatory Visit: Payer: Medicare PPO | Admitting: Physician Assistant

## 2022-12-06 ENCOUNTER — Ambulatory Visit (INDEPENDENT_AMBULATORY_CARE_PROVIDER_SITE_OTHER): Payer: Medicare PPO

## 2022-12-06 ENCOUNTER — Encounter: Payer: Self-pay | Admitting: Physician Assistant

## 2022-12-06 DIAGNOSIS — G8929 Other chronic pain: Secondary | ICD-10-CM | POA: Diagnosis not present

## 2022-12-06 DIAGNOSIS — M25511 Pain in right shoulder: Secondary | ICD-10-CM | POA: Diagnosis not present

## 2022-12-06 DIAGNOSIS — M19012 Primary osteoarthritis, left shoulder: Secondary | ICD-10-CM | POA: Diagnosis not present

## 2022-12-06 DIAGNOSIS — M25512 Pain in left shoulder: Secondary | ICD-10-CM | POA: Diagnosis not present

## 2022-12-06 NOTE — Progress Notes (Unsigned)
Office Visit Note   Patient: Danielle Pierce           Date of Birth: 13-Sep-1941           MRN: [id number] Visit Date: 12/06/2022              Requested by: Lauree Chandler, NP [place],  [place] [id number] PCP: Lauree Chandler, NP   Assessment & Plan: Visit Diagnoses:  1. Chronic pain of both shoulders     Plan: Exam and findings consistent with glenohumeral arthritis.  She most of her arthritic changes on x-ray are consistent with a glenohumeral joint.  She has gotten a glenohumeral injection by Dr. Durward Fortes approximately 8 months ago.  She does not remember this.  She thinks the pain started in the left shoulder just a few months ago.  I discussed with her her options certainly it sounds as though she probably got very good relief from a glenohumeral injection.  I spoke with Lurena Joiner Dr. Randel Pigg PA who is proficient in doing these types of injections and he is willing to go forward and do this for her today.  She may follow-up as needed.  Follow-Up Instructions: No follow-ups on file.   Orders:  Orders Placed This Encounter  Procedures   XR Shoulder Left   XR Shoulder Right   US Guided Needle Placement - No Linked Charges   No orders of the defined types were placed in this encounter.     Procedures: No procedures performed   Clinical Data: No additional findings.   Subjective: Chief Complaint  Patient presents with   Right Shoulder - Pain   Left Shoulder - Pain    HPI Patient is a pleasant 82 year old woman who presents with left shoulder pain.  No particular injury.  She thinks it has been going on for about a few months.  She is status post right shoulder surgery.  She is complaining of some tingling in her right hand when she is using her treadmill.  Denies any neck pain.  She is more focused on the left shoulder today Review of Systems  All other systems reviewed and are negative.    Objective: Vital Signs: There were no vitals taken  for this visit.  Physical Exam Constitutional:      Appearance: Normal appearance.  Pulmonary:     Effort: Pulmonary effort is normal.  Skin:    General: Skin is warm and dry.  Neurological:     General: No focal deficit present.     Mental Status: She is alert.  Psychiatric:        Mood and Affect: Mood normal.     Ortho Exam Left shoulder:.  She does have pain with forward elevation and internal rotation behind her back.  She has pain with external rotation though fairly good strength.  She has good grip strength and is neurovascularly intact.  She has some discomfort with both empty can and speeds testing. Right shoulder well-healed surgical incision she has no tenderness over her shoulder.  She does have some tightness in the paravertebral muscles.  Grip strength is intact.  She has normal sensation today.  She has good strength with resisted flexion and extension. Specialty Comments:  No specialty comments available.  Imaging: XR Shoulder Left  Result Date: 12/06/2022 Radiographs of her left shoulder were obtained today in multiple projections.  Overall well-preserved acromioclavicular joint.  She does have cystic changes and sclerotic changes of the glenohumeral  joint.  XR Shoulder Right  Result Date: 12/06/2022 Radiographs of her right shoulder were reviewed today.  She does have some degenerative changes of the glenohumeral joint.  She has findings consistent with previous surgery.  No acute fractures.    PMFS History: Patient Active Problem List   Diagnosis Date Noted   Trigger thumb, right thumb 07/27/2020   Body mass index (BMI) of 40.1-44.9 in adult [facility]) 11/14/2018   Localized osteoarthritis of left knee 05/10/2017   HLD (hyperlipidemia) 01/19/2016   Excessive cerumen in both ear canals 05/18/2015   Lumbago 04/08/2014   Internal and external bleeding hemorrhoids 07/08/2013   DM (diabetes mellitus), type 2 with renal complications ([place]) 123XX123   Essential  hypertension 02/20/2013   Obesity    Vitamin D deficiency    Past Medical History:  Diagnosis Date   Arthritis    Ankle   Asymptomatic varicose veins    Benign neoplasm of colon    Bilateral shoulder pain 07/02/14   Cataract    Bilateral - just watching   Cervicitis and endocervicitis 09/13/2011   Disorder of bone and cartilage, unspecified    Disorders of bursae and tendons in shoulder region, unspecified    External hemorrhoids without mention of complication    Fibrocystic breast    Generalized osteoarthrosis, unspecified site    GERD (gastroesophageal reflux disease)    diet controlled, No meds   Hiatal hernia    Hypertension    Impacted cerumen 11/07/2011   Obesity, unspecified    Other diseases of nasal cavity and sinuses(478.19)    Pain in joint, ankle and foot    Pain in joint, pelvic region and thigh    Pain in joint, shoulder region    Reflux esophagitis    SVD (spontaneous vaginal delivery)    x 2   Unspecified essential hypertension    Unspecified vitamin D deficiency     Family History  Problem Relation Age of Onset   Hypertension Mother    Kidney disease Mother        Renal failure   Cancer Brother        Lymphoma   ADD / ADHD Son    Seizures Son    Hypertension Sister    Dementia Sister    Hypertension Sister    Hypertension Sister    Cancer - Other Sister    Early death Brother        Some type of accident   Colon cancer Father 76   Stomach cancer Neg Hx    Rectal cancer Neg Hx     Past Surgical History:  Procedure Laterality Date   COLONOSCOPY  2006   Dr.Orr   COLONOSCOPY  07/08/2013   Dr. Carlean Purl   FLEXIBLE SIGMOIDOSCOPY  1990   Hemorrhoids    MOUTH SURGERY  2005   DR LUTINS - tooth ext and gum surgery   SHOULDER ARTHROSCOPY W/ ACROMIAL REPAIR Right 07/02/14   Dr. Durward Fortes   UPPER GASTROINTESTINAL ENDOSCOPY     normal   Social History   Occupational History   Occupation: retired Landscape architect  Tobacco Use   Smoking  status: Never   Smokeless tobacco: Never  Vaping Use   Vaping Use: Never used  Substance and Sexual Activity   Alcohol use: Yes    Comment: occasional mixed drink    Drug use: No   Sexual activity: Not Currently    Birth control/protection: Post-menopausal

## 2022-12-06 NOTE — Progress Notes (Unsigned)
   Procedure Note  Patient: Danielle Pierce             Date of Birth: 05-25-1941           MRN: [id number]             Visit Date: 12/06/2022  Procedures: Visit Diagnoses:  1. Primary osteoarthritis, left shoulder   2. Chronic pain of both shoulders     Large Joint Inj: L glenohumeral on 12/06/2022 5:57 PM Indications: diagnostic evaluation and pain Details: 22 G 1.5 in and 3.5 in needle, ultrasound-guided posterior approach  Arthrogram: No  Medications: 9 mL bupivacaine 0.5 %; 40 mg methylPREDNISolone acetate 40 MG/ML; 5 mL lidocaine 1 % Outcome: tolerated well, no immediate complications Procedure, treatment alternatives, risks and benefits explained, specific risks discussed. Consent was given by the patient. Immediately prior to procedure a time out was called to verify the correct patient, procedure, equipment, support staff and site/side marked as required. Patient was prepped and draped in the usual sterile fashion.

## 2022-12-07 MED ORDER — LIDOCAINE HCL 1 % IJ SOLN
5.0000 mL | INTRAMUSCULAR | Status: AC | PRN
  Administered 2022-12-06: 5 mL

## 2022-12-07 MED ORDER — BUPIVACAINE HCL 0.5 % IJ SOLN
9.0000 mL | INTRAMUSCULAR | Status: AC | PRN
  Administered 2022-12-06: 9 mL via INTRA_ARTICULAR

## 2022-12-07 MED ORDER — METHYLPREDNISOLONE ACETATE 40 MG/ML IJ SUSP
40.0000 mg | INTRAMUSCULAR | Status: AC | PRN
  Administered 2022-12-06: 40 mg via INTRA_ARTICULAR

## 2023-01-27 ENCOUNTER — Encounter: Payer: Self-pay | Admitting: Nurse Practitioner

## 2023-03-27 ENCOUNTER — Other Ambulatory Visit: Payer: Self-pay

## 2023-03-27 DIAGNOSIS — I10 Essential (primary) hypertension: Secondary | ICD-10-CM

## 2023-03-27 DIAGNOSIS — E1122 Type 2 diabetes mellitus with diabetic chronic kidney disease: Secondary | ICD-10-CM

## 2023-03-27 DIAGNOSIS — E785 Hyperlipidemia, unspecified: Secondary | ICD-10-CM

## 2023-04-16 ENCOUNTER — Other Ambulatory Visit: Payer: Medicare PPO

## 2023-04-23 ENCOUNTER — Other Ambulatory Visit: Payer: Medicare PPO

## 2023-04-23 DIAGNOSIS — E785 Hyperlipidemia, unspecified: Secondary | ICD-10-CM | POA: Diagnosis not present

## 2023-04-23 DIAGNOSIS — E1122 Type 2 diabetes mellitus with diabetic chronic kidney disease: Secondary | ICD-10-CM | POA: Diagnosis not present

## 2023-04-23 DIAGNOSIS — N1831 Chronic kidney disease, stage 3a: Secondary | ICD-10-CM | POA: Diagnosis not present

## 2023-04-23 DIAGNOSIS — I10 Essential (primary) hypertension: Secondary | ICD-10-CM | POA: Diagnosis not present

## 2023-04-23 LAB — CBC WITH DIFFERENTIAL/PLATELET
Absolute Monocytes: 538 cells/uL (ref 200–950)
Basophils Relative: 0.5 %
Eosinophils Relative: 2.7 %
Hemoglobin: 12.3 g/dL (ref 11.7–15.5)
Lymphs Abs: 2253 cells/uL (ref 850–3900)
MCV: 92.5 fL (ref 80.0–100.0)
MPV: 10.4 fL (ref 7.5–12.5)
Neutro Abs: 3405 cells/uL (ref 1500–7800)
RDW: 14 % (ref 11.0–15.0)
Total Lymphocyte: 35.2 %

## 2023-04-24 LAB — CBC WITH DIFFERENTIAL/PLATELET
Basophils Absolute: 32 cells/uL (ref 0–200)
Eosinophils Absolute: 173 cells/uL (ref 15–500)
HCT: 37 % (ref 35.0–45.0)
MCH: 30.8 pg (ref 27.0–33.0)
MCHC: 33.2 g/dL (ref 32.0–36.0)
Monocytes Relative: 8.4 %
Neutrophils Relative %: 53.2 %
Platelets: 206 10*3/uL (ref 140–400)
RBC: 4 10*6/uL (ref 3.80–5.10)
WBC: 6.4 10*3/uL (ref 3.8–10.8)

## 2023-04-24 LAB — COMPLETE METABOLIC PANEL WITH GFR
AG Ratio: 2.1 (calc) (ref 1.0–2.5)
ALT: 16 U/L (ref 6–29)
AST: 24 U/L (ref 10–35)
Albumin: 3.9 g/dL (ref 3.6–5.1)
Alkaline phosphatase (APISO): 93 U/L (ref 37–153)
BUN/Creatinine Ratio: 9 (calc) (ref 6–22)
BUN: 10 mg/dL (ref 7–25)
CO2: 25 mmol/L (ref 20–32)
Calcium: 9.3 mg/dL (ref 8.6–10.4)
Chloride: 109 mmol/L (ref 98–110)
Creat: 1.07 mg/dL — ABNORMAL HIGH (ref 0.60–0.95)
Globulin: 1.9 g/dL (calc) (ref 1.9–3.7)
Glucose, Bld: 105 mg/dL — ABNORMAL HIGH (ref 65–99)
Potassium: 4.2 mmol/L (ref 3.5–5.3)
Sodium: 141 mmol/L (ref 135–146)
Total Bilirubin: 0.4 mg/dL (ref 0.2–1.2)
Total Protein: 5.8 g/dL — ABNORMAL LOW (ref 6.1–8.1)
eGFR: 53 mL/min/{1.73_m2} — ABNORMAL LOW (ref >=60)

## 2023-04-24 LAB — HEMOGLOBIN A1C
Hgb A1c MFr Bld: 6.4 % of total Hgb — ABNORMAL HIGH (ref <5.7)
Mean Plasma Glucose: 137 mg/dL
eAG (mmol/L): 7.6 mmol/L

## 2023-04-24 LAB — LIPID PANEL
Cholesterol: 141 mg/dL (ref <200)
HDL: 55 mg/dL (ref >=50)
LDL Cholesterol (Calc): 74 mg/dL (calc)
Non-HDL Cholesterol (Calc): 86 mg/dL (calc) (ref <130)
Total CHOL/HDL Ratio: 2.6 (calc) (ref <5.0)
Triglycerides: 50 mg/dL (ref <150)

## 2023-04-27 ENCOUNTER — Ambulatory Visit: Payer: Medicare PPO | Admitting: Nurse Practitioner

## 2023-04-27 ENCOUNTER — Encounter: Payer: Self-pay | Admitting: Nurse Practitioner

## 2023-04-27 VITALS — BP 128/82 | HR 87 | Temp 97.6°F | Ht 63.0 in | Wt 248.0 lb

## 2023-04-27 DIAGNOSIS — I10 Essential (primary) hypertension: Secondary | ICD-10-CM

## 2023-04-27 DIAGNOSIS — H6123 Impacted cerumen, bilateral: Secondary | ICD-10-CM | POA: Diagnosis not present

## 2023-04-27 DIAGNOSIS — E785 Hyperlipidemia, unspecified: Secondary | ICD-10-CM

## 2023-04-27 DIAGNOSIS — N1831 Chronic kidney disease, stage 3a: Secondary | ICD-10-CM

## 2023-04-27 DIAGNOSIS — E1122 Type 2 diabetes mellitus with diabetic chronic kidney disease: Secondary | ICD-10-CM | POA: Diagnosis not present

## 2023-04-27 DIAGNOSIS — Z6841 Body Mass Index (BMI) 40.0 and over, adult: Secondary | ICD-10-CM

## 2023-04-27 NOTE — Progress Notes (Unsigned)
Careteam: Patient Care Team: Danielle Seller, NP as PCP - General (Geriatric Medicine) Danielle Batman, MD (Inactive) as Consulting Physician (Orthopedic Surgery) Danielle Bolus, MD as Consulting Physician (Ophthalmology) Danielle Roberts, MD as Consulting Physician (Dermatology) Danielle Boop, MD as Consulting Physician (Gastroenterology)  PLACE OF SERVICE:  [facility]  Advanced Directive information Does Patient Have a Medical Advance Directive?: Yes, Type of Advance Directive: Healthcare Power of [name];Living will, Does patient want to make changes to medical advance directive?: No - Patient declined  No Known Allergies  Chief Complaint  Patient presents with   Medical Management of Chronic Issues    6 month follow-up. Discuss need for eye exam( pending for September 2024), shingrix, covid boosters, and diabetic kidney evaluation. Patient would like to discuss weight. Patient denies receiving any vaccines since last visit.      HPI: Patient is a 82 y.o. female for routine follow up.   She reports she is doing well except for her weight.  She is taking a protein supplement to help her get protein.  Drinks sweet tea but drinking more water.  Gets exercises in weekly   She has had shoulder surgery in the past, followed by orthopedic due to OA in right shoulder. No overwhelming pain.   No anxiety or depression.  Reports she sleeps well and feels well rested when she gets up.    Review of Systems:  Review of Systems  Constitutional:  Negative for chills, fever and weight loss.  HENT:  Negative for tinnitus.   Respiratory:  Negative for cough, sputum production and shortness of breath.   Cardiovascular:  Negative for chest pain, palpitations and leg swelling.  Gastrointestinal:  Negative for constipation, diarrhea and heartburn.  Genitourinary:  Negative for dysuria, frequency and urgency.  Musculoskeletal:  Negative for back pain, falls, joint pain and  myalgias.  Skin: Negative.   Neurological:  Negative for dizziness and headaches.  Psychiatric/Behavioral:  Negative for depression and memory loss. The patient does not have insomnia.     Past Medical History:  Diagnosis Date   Arthritis    Ankle   Asymptomatic varicose veins    Benign neoplasm of colon    Bilateral shoulder pain 07/02/14   Cataract    Bilateral - just watching   Cervicitis and endocervicitis 09/13/2011   Disorder of bone and cartilage, unspecified    Disorders of bursae and tendons in shoulder region, unspecified    External hemorrhoids without mention of complication    Fibrocystic breast    Generalized osteoarthrosis, unspecified site    GERD (gastroesophageal reflux disease)    diet controlled, No meds   Hiatal hernia    Hypertension    Impacted cerumen 11/07/2011   Obesity, unspecified    Other diseases of nasal cavity and sinuses(478.19)    Pain in joint, ankle and foot    Pain in joint, pelvic region and thigh    Pain in joint, shoulder region    Reflux esophagitis    SVD (spontaneous vaginal delivery)    x 2   Unspecified essential hypertension    Unspecified vitamin D deficiency    Past Surgical History:  Procedure Laterality Date   COLONOSCOPY  2006   Dr.Orr   COLONOSCOPY  07/08/2013   Dr. Leone Payor   FLEXIBLE SIGMOIDOSCOPY  1990   Hemorrhoids    MOUTH SURGERY  2005   DR LUTINS - tooth ext and gum surgery   SHOULDER ARTHROSCOPY W/ ACROMIAL REPAIR Right 07/02/14  Dr. Cleophas Dunker   UPPER GASTROINTESTINAL ENDOSCOPY     normal   Social History:   reports that she has never smoked. She has never used smokeless tobacco. She reports current alcohol use. She reports that she does not use drugs.  Family History  Problem Relation Age of Onset   Hypertension Mother    Kidney disease Mother        Renal failure   Cancer Brother        Lymphoma   ADD / ADHD Son    Seizures Son    Hypertension Sister    Dementia Sister    Hypertension Sister     Hypertension Sister    Cancer - Other Sister    Early death Brother        Some type of accident   Colon cancer Father 27   Stomach cancer Neg Hx    Rectal cancer Neg Hx     Medications: Patient's Medications  New Prescriptions   No medications on file  Previous Medications   ASPIRIN 81 MG TABLET    Take 81 mg by mouth daily.   ATORVASTATIN (LIPITOR) 20 MG TABLET    Take 1 tablet (20 mg total) by mouth at bedtime.   CALCIUM CARBONATE-VITAMIN D (CALCIUM-VITAMIN D) 500-200 MG-UNIT PER TABLET    Take 1 tablet by mouth daily.   CARBOXYMETHYLCELL-HYPROMELLOSE 0.25-0.3 % GEL    Apply 1 application to eye daily. to alleviate irritation.   CHOLECALCIFEROL (VITAMIN D-3 PO)    Take 1 tablet by mouth daily.   HYDROCORTISONE (ANUSOL-HC) 25 MG SUPPOSITORY    Place 1 suppository (25 mg total) rectally daily as needed for hemorrhoids or anal itching.   LOSARTAN-HYDROCHLOROTHIAZIDE (HYZAAR) 50-12.5 MG TABLET    Take 1 tablet by mouth daily.   PROTEIN POWD    Take 1 Scoop by mouth as directed. A few times weekly, varies   SIMETHICONE 80 MG TABS    Take 1 tablet (80 mg total) by mouth every 8 (eight) hours.   TURMERIC PO    Take 1 tablet by mouth as needed. Also taking turmeric liquid supplement   VITAMIN B-12 (CYANOCOBALAMIN) 1000 MCG TABLET    Take 1,000 mcg by mouth daily.   VITAMIN C (ASCORBIC ACID) 500 MG TABLET    Take 500 mg by mouth daily.   VITAMIN E 400 UNIT CAPSULE    Take 400 Units by mouth daily.  Modified Medications   No medications on file  Discontinued Medications   No medications on file    Physical Exam:  Vitals:   04/27/23 1003  BP: 128/82  Pulse: 87  Temp: 97.6 F (36.4 C)  TempSrc: Temporal  SpO2: 98%  Weight: 248 lb (112.5 kg)  Height: 5\' 3"  (1.6 m)   Body mass index is 43.93 kg/m. Wt Readings from Last 3 Encounters:  04/27/23 248 lb (112.5 kg)  10/26/22 248 lb 3.2 oz (112.6 kg)  10/16/22 248 lb (112.5 kg)    Physical Exam Constitutional:      General:  She is not in acute distress.    Appearance: She is well-developed. She is not diaphoretic.  HENT:     Head: Normocephalic and atraumatic.     Mouth/Throat:     Pharynx: No oropharyngeal exudate.  Eyes:     Conjunctiva/sclera: Conjunctivae normal.     Pupils: Pupils are equal, round, and reactive to light.  Cardiovascular:     Rate and Rhythm: Regular rhythm.     Heart  sounds: Normal heart sounds.  Pulmonary:     Effort: Pulmonary effort is normal.     Breath sounds: Normal breath sounds.  Abdominal:     General: Bowel sounds are normal.     Palpations: Abdomen is soft.  Musculoskeletal:     Cervical back: Normal range of motion and neck supple.     Right lower leg: No edema.     Left lower leg: No edema.  Skin:    General: Skin is warm and dry.  Neurological:     Mental Status: She is alert.  Psychiatric:        Mood and Affect: Mood normal.     Labs reviewed: Basic Metabolic Panel: Recent Labs    10/05/22 0933 04/23/23 0947  NA 140 141  K 3.9 4.2  CL 106 109  CO2 27 25  GLUCOSE 102* 105*  BUN 15 10  CREATININE 1.08* 1.07*  CALCIUM 9.3 9.3   Liver Function Tests: Recent Labs    10/05/22 0933 04/23/23 0947  AST 25 24  ALT 16 16  BILITOT 0.7 0.4  PROT 6.4 5.8*   No results for input(s): "LIPASE", "AMYLASE" in the last 8760 hours. No results for input(s): "AMMONIA" in the last 8760 hours. CBC: Recent Labs    10/05/22 0933 04/23/23 0947  WBC 7.6 6.4  NEUTROABS 4,142 3,405  HGB 12.5 12.3  HCT 36.6 37.0  MCV 92.0 92.5  PLT 210 206   Lipid Panel: Recent Labs    10/05/22 0933 04/23/23 0947  CHOL 158 141  HDL 63 55  LDLCALC 81 74  TRIG 56 50  CHOLHDL 2.5 2.6   TSH: No results for input(s): "TSH" in the last 8760 hours. A1C: Lab Results  Component Value Date   HGBA1C 6.4 (H) 04/23/2023     Assessment/Plan 1. Type 2 diabetes mellitus with stage 3a chronic kidney disease, without long-term current use of insulin (HCC) -Encouraged  dietary compliance, routine foot care/monitoring and to keep up with diabetic eye exams through ophthalmology  - Amb ref to Medical Nutrition Therapy-MNT - Hemoglobin A1c; Future - Microalbumin/Creatinine Ratio, Urine  2. Essential hypertension -Blood pressure well controlled, goal bp <140/90 Continue current medications and dietary modifications follow metabolic panel - COMPLETE METABOLIC PANEL WITH GFR; Future - CBC with Differential/Platelet; Future  3. Hyperlipidemia, unspecified hyperlipidemia type -continues on lipitor 20 mg daily  4. Class 3 severe obesity due to excess calories with serious comorbidity and body mass index (BMI) of 45.0 to 49.9 in adult [facility]) -discussed lifestyle modifications - Amb ref to Medical Nutrition Therapy-MNT  5. Stage 3a chronic kidney disease (HCC) -Chronic and stable Encourage proper hydration Follow metabolic panel Avoid nephrotoxic meds (NSAIDS)  6. Bilateral impacted cerumen Removed cerumen bilaterally, pt tolerate dwell.   Return in about 6 months (around 10/28/2023) for routine follow up, labs prior to visit .  Danielle Pierce. Biagio Borg [facility] [phone]

## 2023-04-28 ENCOUNTER — Other Ambulatory Visit: Payer: Self-pay | Admitting: Nurse Practitioner

## 2023-04-28 DIAGNOSIS — I10 Essential (primary) hypertension: Secondary | ICD-10-CM

## 2023-04-28 DIAGNOSIS — E785 Hyperlipidemia, unspecified: Secondary | ICD-10-CM

## 2023-05-08 DIAGNOSIS — Z1231 Encounter for screening mammogram for malignant neoplasm of breast: Secondary | ICD-10-CM | POA: Diagnosis not present

## 2023-05-08 LAB — HM MAMMOGRAPHY

## 2023-07-16 ENCOUNTER — Ambulatory Visit (INDEPENDENT_AMBULATORY_CARE_PROVIDER_SITE_OTHER): Payer: Medicare PPO | Admitting: Nurse Practitioner

## 2023-07-16 ENCOUNTER — Encounter: Payer: Self-pay | Admitting: Nurse Practitioner

## 2023-07-16 VITALS — BP 130/78 | HR 75 | Temp 91.4°F | Ht 64.08 in | Wt 243.0 lb

## 2023-07-16 DIAGNOSIS — E2839 Other primary ovarian failure: Secondary | ICD-10-CM | POA: Diagnosis not present

## 2023-07-16 DIAGNOSIS — Z0000 Encounter for general adult medical examination without abnormal findings: Secondary | ICD-10-CM | POA: Diagnosis not present

## 2023-07-16 DIAGNOSIS — Z23 Encounter for immunization: Secondary | ICD-10-CM | POA: Diagnosis not present

## 2023-07-16 NOTE — Addendum Note (Signed)
Addended by: Maurice Small on: 07/16/2023 10:41 AM   Modules accepted: Orders

## 2023-07-16 NOTE — Progress Notes (Signed)
Subjective:   Danielle Pierce is a 82 y.o. female who presents for Medicare Annual (Subsequent) preventive examination.  Visit Complete: In person   Cardiac Risk Factors include: advanced age (>36men, >81 women);diabetes mellitus;dyslipidemia;hypertension;obesity (BMI >30kg/m2)     Objective:    Today's Vitals   07/16/23 0959  BP: 130/78  Pulse: 75  Temp: (!) 91.4 F (33 C)  TempSrc: Temporal  SpO2: 96%  Weight: 243 lb (110.2 kg)  Height: 5' 4.08" (1.628 m)   Body mass index is 41.61 kg/m.     07/16/2023   10:07 AM 04/27/2023   10:06 AM 10/26/2022   10:11 AM 10/16/2022   10:25 AM 07/13/2022   10:43 AM 03/30/2022    3:26 PM 11/28/2021   10:29 AM  Advanced Directives  Does Patient Have a Medical Advance Directive? Yes Yes Yes Yes Yes Yes No  Type of Estate agent of [place];Living will Healthcare Power of [place];Living will Living will;Healthcare Power of Attorney Living will;Healthcare Power of [employer] Power of [place];Living will Healthcare Power of [place];Living will   Does patient want to make changes to medical advance directive? No - Patient declined No - Patient declined No - Patient declined No - Patient declined No - Patient declined    Copy of Healthcare Power of Attorney in Chart? No - copy requested No - copy requested No - copy requested No - copy requested No - copy requested No - copy requested   Would patient like information on creating a medical advance directive?       No - Patient declined    Current Medications (verified) Outpatient Encounter Medications as of 07/16/2023  Medication Sig   aspirin 81 MG tablet Take 81 mg by mouth daily.   atorvastatin (LIPITOR) 20 MG tablet Take 1 tablet (20 mg total) by mouth at bedtime.   Calcium Carbonate-Vitamin D (CALCIUM-VITAMIN D) 500-200 MG-UNIT per tablet Take 1 tablet by mouth daily.   Carboxymethylcell-Hypromellose 0.25-0.3 % GEL Apply 1 application to eye daily. to  alleviate irritation.   Cholecalciferol (VITAMIN D-3 PO) Take 1 tablet by mouth daily.   hydrocortisone (ANUSOL-HC) 25 MG suppository Place 1 suppository (25 mg total) rectally daily as needed for hemorrhoids or anal itching.   losartan-hydrochlorothiazide (HYZAAR) 50-12.5 MG tablet Take 1 tablet by mouth daily.   Protein POWD Take 1 Scoop by mouth daily.   simethicone (MYLICON) 80 MG chewable tablet Chew 80 mg by mouth as needed for flatulence.   TURMERIC PO Take 1 tablet by mouth as needed. Also taking turmeric liquid supplement   vitamin B-12 (CYANOCOBALAMIN) 500 MCG tablet Take 500 mcg by mouth daily.   vitamin C (ASCORBIC ACID) 500 MG tablet Take 500 mg by mouth daily.   vitamin E 400 UNIT capsule Take 400 Units by mouth daily.   [DISCONTINUED] Simethicone 80 MG TABS Take 1 tablet (80 mg total) by mouth every 8 (eight) hours.   [DISCONTINUED] vitamin B-12 (CYANOCOBALAMIN) 1000 MCG tablet Take 1,000 mcg by mouth daily. (Patient not taking: Reported on 07/16/2023)   No facility-administered encounter medications on file as of 07/16/2023.    Allergies (verified) Patient has no known allergies.   History: Past Medical History:  Diagnosis Date   Arthritis    Ankle   Asymptomatic varicose veins    Benign neoplasm of colon    Bilateral shoulder pain 07/02/14   Cataract    Bilateral - just watching   Cervicitis and endocervicitis 09/13/2011   Disorder of bone  and cartilage, unspecified    Disorders of bursae and tendons in shoulder region, unspecified    External hemorrhoids without mention of complication    Fibrocystic breast    Generalized osteoarthrosis, unspecified site    GERD (gastroesophageal reflux disease)    diet controlled, No meds   Hiatal hernia    Hypertension    Impacted cerumen 11/07/2011   Obesity, unspecified    Other diseases of nasal cavity and sinuses(478.19)    Pain in joint, ankle and foot    Pain in joint, pelvic region and thigh    Pain in joint,  shoulder region    Reflux esophagitis    SVD (spontaneous vaginal delivery)    x 2   Unspecified essential hypertension    Unspecified vitamin D deficiency    Past Surgical History:  Procedure Laterality Date   COLONOSCOPY  2006   Dr.Orr   COLONOSCOPY  07/08/2013   Dr. Leone Payor   FLEXIBLE SIGMOIDOSCOPY  1990   Hemorrhoids    MOUTH SURGERY  2005   DR LUTINS - tooth ext and gum surgery   SHOULDER ARTHROSCOPY W/ ACROMIAL REPAIR Right 07/02/14   Dr. Cleophas Dunker   UPPER GASTROINTESTINAL ENDOSCOPY     normal   Family History  Problem Relation Age of Onset   Hypertension Mother    Kidney disease Mother        Renal failure   Cancer Brother        Lymphoma   ADD / ADHD Son    Seizures Son    Hypertension Sister    Dementia Sister    Hypertension Sister    Hypertension Sister    Cancer - Other Sister    Early death Brother        Some type of accident   Colon cancer Father 70   Stomach cancer Neg Hx    Rectal cancer Neg Hx    Social History   Socioeconomic History   Marital status: Widowed    Spouse name: Not on file   Number of children: Not on file   Years of education: Not on file   Highest education level: Not on file  Occupational History   Occupation: retired Camera operator  Tobacco Use   Smoking status: Never   Smokeless tobacco: Never  Vaping Use   Vaping status: Never Used  Substance and Sexual Activity   Alcohol use: Yes    Comment: occasional mixed drink    Drug use: No   Sexual activity: Not Currently    Birth control/protection: Post-menopausal  Other Topics Concern   Not on file  Social History Narrative   Married    Never smoked   Alcohlol none   Exercise treadmill 3 times a week    Social Determinants of Health   Financial Resource Strain: Low Risk  (06/13/2018)   Overall Financial Resource Strain (CARDIA)    Difficulty of Paying Living Expenses: Not hard at all  Food Insecurity: No Food Insecurity (06/13/2018)   Hunger Vital Sign     Worried About Running Out of Food in the Last Year: Never true    Ran Out of Food in the Last Year: Never true  Transportation Needs: No Transportation Needs (06/13/2018)   PRAPARE - Administrator, Civil Service (Medical): No    Lack of Transportation (Non-Medical): No  Physical Activity: Inactive (06/13/2018)   Exercise Vital Sign    Days of Exercise per Week: 0 days    Minutes  of Exercise per Session: 0 min  Stress: Stress Concern Present (06/13/2018)   [employer] of Occupational Health - Occupational Stress Questionnaire    Feeling of Stress : To some extent  Social Connections: Moderately Integrated (06/13/2018)   Social Connection and Isolation Panel [NHANES]    Frequency of Communication with Friends and Family: More than three times a week    Frequency of Social Gatherings with Friends and Family: More than three times a week    Attends Religious Services: More than 4 times per year    Active Member of [employer] or Organizations: No    Attends Engineer, structural: Never    Marital Status: Married    Tobacco Counseling Counseling given: Not Answered   Clinical Intake:  Pre-visit preparation completed: Yes  Pain : No/denies pain     BMI - recorded: 41 Nutritional Status: BMI > 30  Obese Nutritional Risks: None Diabetes: Yes  How often do you need to have someone help you when you read instructions, pamphlets, or other written materials from your doctor or pharmacy?: 1 - Never         Activities of Daily Living    07/16/2023   10:26 AM 07/16/2023   10:25 AM  In your present state of health, do you have any difficulty performing the following activities:  Hearing? 0   Vision? 0   Difficulty concentrating or making decisions? 0   Walking or climbing stairs? 0   Dressing or bathing? 0   Doing errands, shopping? 0   Preparing Food and eating ? N   Using the Toilet? N   In the past six months, have you accidently leaked urine? N    Do you have problems with loss of bowel control? N   Managing your Medications?  N  Managing your Finances?  N  Housekeeping or managing your Housekeeping?  N    Patient Care Team: Sharon Seller, NP as PCP - General (Geriatric Medicine) Valeria Batman, MD (Inactive) as Consulting Physician (Orthopedic Surgery) Cherlyn Roberts, MD as Consulting Physician (Dermatology) Iva Boop, MD as Consulting Physician (Gastroenterology) Sinda Du, MD as Consulting Physician (Ophthalmology)  Indicate any recent Medical Services you may have received from other than [facility] providers in the past year (date may be approximate).     Assessment:   This is a routine wellness examination for Danielle Pierce.  Hearing/Vision screen Hearing Screening - Comments:: No hearing issues  Vision Screening - Comments:: Next eye exam pending in November 2024, will be with a new provider since Dr.Shapiro retired. Will see Dr.Dr.Bowen,[place]    Goals Addressed   None   Depression Screen    07/16/2023    9:59 AM 04/27/2023   10:09 AM 07/13/2022   10:41 AM 03/31/2022   10:09 AM 11/28/2021   10:29 AM 07/07/2021   10:25 AM 08/19/2020    8:55 AM  PHQ 2/9 Scores  PHQ - 2 Score 0 0 0 0 0 0 0    Fall Risk    07/16/2023    9:59 AM 04/27/2023   10:09 AM 10/26/2022   10:11 AM 10/16/2022   10:25 AM 07/13/2022   10:42 AM  Fall Risk   Falls in the past year? 0 0 0 0 0  Number falls in past yr: 0 0 0 0 0  Injury with Fall? 0 0 0 0 0  Risk for fall due to : No Fall Risks No Fall Risks No Fall Risks History of fall(s)  No Fall Risks  Follow up Falls evaluation completed Falls evaluation completed Falls evaluation completed Falls evaluation completed Falls evaluation completed    MEDICARE RISK AT HOME: Medicare Risk at Home Any stairs in or around the home?: Yes If so, are there any without handrails?: No Home free of loose throw rugs in walkways, pet beds, electrical cords, etc?: Yes Adequate lighting  in your home to reduce risk of falls?: Yes Life alert?: Yes Use of a cane, walker or w/c?: No Grab bars in the bathroom?: No Shower chair or bench in shower?: No Elevated toilet seat or a handicapped toilet?: Yes  TIMED UP AND GO:  Was the test performed?  No    Cognitive Function:    07/16/2023   10:09 AM 06/24/2019    9:37 AM 06/13/2018    8:51 AM 11/08/2016   11:29 AM  MMSE - Mini Mental State Exam  Orientation to time 5 5 5 5   Orientation to Place 5 5 5 5   Registration 3 3 3 3   Attention/ Calculation 5 5 5 5   Recall 1 0 2 2  Language- name 2 objects 2 2 2 2   Language- repeat 1 1 1 1   Language- follow 3 step command 3 3 3 3   Language- read & follow direction 1 1 1 1   Write a sentence 1 1 1 1   Copy design 1 1 0 1  Total score 28 27 28 29         07/13/2022   10:43 AM 07/07/2021   10:28 AM 07/02/2020    9:49 AM  6CIT Screen  What Year? 0 points 0 points 0 points  What month? 0 points 0 points 0 points  What time? 0 points 0 points 0 points  Count back from 20 0 points 0 points 0 points  Months in reverse 0 points 0 points 0 points  Repeat phrase 0 points 0 points 2 points  Total Score 0 points 0 points 2 points    Immunizations Immunization History  Administered Date(s) Administered   Fluad Quad(high Dose 65+) 06/24/2019, 08/19/2020, 07/29/2021, 07/24/2022   Influenza, High Dose Seasonal PF 09/17/2017, 06/17/2018   PFIZER Comirnaty(Gray Top)Covid-19 Tri-Sucrose Vaccine 01/31/2021   PFIZER(Purple Top)SARS-COV-2 Vaccination 11/08/2019, 11/29/2019, 07/30/2020   PPD Test 10/02/1997   Pfizer Covid-19 Vaccine Bivalent Booster 74yrs & up 07/15/2021   Pneumococcal Conjugate-13 05/10/2017   Pneumococcal Polysaccharide-23 06/13/2018   Td 04/30/1998   Tdap 04/19/2020    TDAP status: Up to date  Flu Vaccine status: Declined, Education has been provided regarding the importance of this vaccine but patient still declined. Advised may receive this vaccine at local pharmacy  or Health Dept. Aware to provide a copy of the vaccination record if obtained from local pharmacy or Health Dept. Verbalized acceptance and understanding.  Pneumococcal vaccine status: Up to date  Covid-19 vaccine status: Information provided on how to obtain vaccines.   Qualifies for Shingles Vaccine? Yes   Zostavax completed No   Shingrix Completed?: No.    Education has been provided regarding the importance of this vaccine. Patient has been advised to call insurance company to determine out of pocket expense if they have not yet received this vaccine. Advised may also receive vaccine at local pharmacy or Health Dept. Verbalized acceptance and understanding.  Screening Tests Health Maintenance  Topic Date Due   Zoster Vaccines- Shingrix (1 of 2) Never done   OPHTHALMOLOGY EXAM  05/30/2022   DEXA SCAN  04/19/2023   COVID-19 Vaccine (6 -  2023-24 season) 06/03/2023   FOOT EXAM  10/17/2023   HEMOGLOBIN A1C  10/24/2023   Diabetic kidney evaluation - eGFR measurement  04/22/2024   Diabetic kidney evaluation - Urine ACR  04/26/2024   Medicare Annual Wellness (AWV)  07/15/2024   DTaP/Tdap/Td (3 - Td or Tdap) 04/19/2030   Pneumonia Vaccine 91+ Years old  Completed   HPV VACCINES  Aged Out   Hepatitis C Screening  Discontinued    Health Maintenance  Health Maintenance Due  Topic Date Due   Zoster Vaccines- Shingrix (1 of 2) Never done   OPHTHALMOLOGY EXAM  05/30/2022   DEXA SCAN  04/19/2023   COVID-19 Vaccine (6 - 2023-24 season) 06/03/2023    Colorectal cancer screening: No longer required.   Mammogram status: No longer required due to age.  Bone Density status: Ordered today. Pt provided with contact info and advised to call to schedule appt.  Lung Cancer Screening: (Low Dose CT Chest recommended if Age 67-80 years, 20 pack-year currently smoking OR have quit w/in 15years.) does not qualify.   Lung Cancer Screening Referral: na  Additional Screening:  Hepatitis C  Screening: does not qualify;   Vision Screening: Recommended annual ophthalmology exams for early detection of glaucoma and other disorders of the eye. Is the patient up to date with their annual eye exam?  Yes  Who is the provider or what is the name of the office in which the patient attends annual eye exams? Dr.Bowen,[place] If pt is not established with a provider, would they like to be referred to a provider to establish care? No .   Dental Screening: Recommended annual dental exams for proper oral hygiene  Diabetic Foot Exam: Diabetic Foot Exam: Completed 10/26/2022  Community Resource Referral / Chronic Care Management: CRR required this visit?  No   CCM required this visit?  No     Plan:     I have personally reviewed and noted the following in the patient's chart:   Medical and social history Use of alcohol, tobacco or illicit drugs  Current medications and supplements including opioid prescriptions. Patient is not currently taking opioid prescriptions. Functional ability and status Nutritional status Physical activity Advanced directives List of other physicians Hospitalizations, surgeries, and ER visits in previous 12 months Vitals Screenings to include cognitive, depression, and falls Referrals and appointments  In addition, I have reviewed and discussed with patient certain preventive protocols, quality metrics, and best practice recommendations. A written personalized care plan for preventive services as well as general preventive health recommendations were provided to patient.     Sharon Seller, NP   07/16/2023

## 2023-07-16 NOTE — Patient Instructions (Signed)
  Danielle Pierce , Thank you for taking time to come for your Medicare Wellness Visit. I appreciate your ongoing commitment to your health goals. Please review the following plan we discussed and let me know if I can assist you in the future.   These are the goals we discussed:  Goals      Weight (lb) < 200 lb (90.7 kg)     I would like to loss weight at least 50 lbs  Exercise and eat health.        This is a list of the screening recommended for you and due dates:  Health Maintenance  Topic Date Due   Zoster (Shingles) Vaccine (1 of 2) Never done   Eye exam for diabetics  05/30/2022   DEXA scan (bone density measurement)  04/19/2023   COVID-19 Vaccine (6 - 2023-24 season) 06/03/2023   Complete foot exam   10/17/2023   Hemoglobin A1C  10/24/2023   Yearly kidney function blood test for diabetes  04/22/2024   Yearly kidney health urinalysis for diabetes  04/26/2024   Medicare Annual Wellness Visit  07/15/2024   DTaP/Tdap/Td vaccine (3 - Td or Tdap) 04/19/2030   Pneumonia Vaccine  Completed   HPV Vaccine  Aged Out   Hepatitis C Screening  Discontinued   Bone density- call  [phone]

## 2023-08-02 DIAGNOSIS — E2839 Other primary ovarian failure: Secondary | ICD-10-CM | POA: Diagnosis not present

## 2023-08-02 DIAGNOSIS — M8588 Other specified disorders of bone density and structure, other site: Secondary | ICD-10-CM | POA: Diagnosis not present

## 2023-08-02 LAB — HM DEXA SCAN

## 2023-08-21 DIAGNOSIS — H524 Presbyopia: Secondary | ICD-10-CM | POA: Diagnosis not present

## 2023-08-21 DIAGNOSIS — H2513 Age-related nuclear cataract, bilateral: Secondary | ICD-10-CM | POA: Diagnosis not present

## 2023-08-21 DIAGNOSIS — E119 Type 2 diabetes mellitus without complications: Secondary | ICD-10-CM | POA: Diagnosis not present

## 2023-08-21 LAB — HM DIABETES EYE EXAM

## 2023-09-14 ENCOUNTER — Encounter: Payer: Self-pay | Admitting: Adult Health

## 2023-09-14 ENCOUNTER — Telehealth (INDEPENDENT_AMBULATORY_CARE_PROVIDER_SITE_OTHER): Payer: Medicare PPO | Admitting: Adult Health

## 2023-09-14 DIAGNOSIS — J209 Acute bronchitis, unspecified: Secondary | ICD-10-CM

## 2023-09-14 MED ORDER — GUAIFENESIN-DM 100-10 MG/5ML PO SYRP: 10.0000 mL | ORAL_SOLUTION | Freq: Four times a day (QID) | ORAL | Status: DC | PRN

## 2023-09-14 MED ORDER — AZITHROMYCIN 250 MG PO TABS
ORAL_TABLET | ORAL | 0 refills | Status: AC
Start: 2023-09-14 — End: 2023-09-19

## 2023-09-14 MED ORDER — ALBUTEROL SULFATE HFA 108 (90 BASE) MCG/ACT IN AERS
2.0000 | INHALATION_SPRAY | Freq: Four times a day (QID) | RESPIRATORY_TRACT | 0 refills | Status: AC | PRN
Start: 2023-09-14 — End: ?

## 2023-09-14 NOTE — Progress Notes (Signed)
This service is provided via telemedicine  No vital signs collected/recorded due to the encounter was a telemedicine visit.   Location of patient (ex: home, work):  Home  Patient consents to a telephone visit: Yes  Location of the provider (ex: office, home):  [facility], Office   Name of any referring provider:  Medina-Vargas,Tacari Repass C,NP  Names of all persons participating in the telemedicine service and their role in the encounter:  Ronald Pippins, CMA, Patient, and Medina-Vargas,Ahmya Bernick C,NP  Time spent on call:  9 min with medical assistant        DATE:  09/14/2023 MRN:  [id number]  BIRTHDAY: Jul 06, 1941   Contact Information   None on File    Other Contacts     Name Relation Home Work Mobile   [place]ram Daughter [phone]  [phone]        Advance Directive Documentation    Flowsheet Row Most Recent Value  Type of Advance Directive Healthcare Power of Attorney, Living will  Pre-existing out of facility DNR order (yellow form or pink MOST form) --  "MOST" Form in Place? --        Chief Complaint  Patient presents with   Acute Visit    Very bad cold     HISTORY OF PRESENT ILLNESS: She is an 82 year old female who had a video visit today for bad cold. She stated that she has a cold for a week now but yesterday she started wheezing. She has a productive cough with dark greenish phlegm. She denies fever nor chills. She has clear nasal discharge. She stated that she slept without turning on the heater X 2 and it was very cold when she woke up. She thinks that she got sick after that.  PAST MEDICAL HISTORY:  Past Medical History:  Diagnosis Date   Arthritis    Ankle   Asymptomatic varicose veins    Benign neoplasm of colon    Bilateral shoulder pain 07/02/14   Cataract    Bilateral - just watching   Cervicitis and endocervicitis 09/13/2011   Disorder of bone and cartilage, unspecified    Disorders of bursae  and tendons in shoulder region, unspecified    External hemorrhoids without mention of complication    Fibrocystic breast    Generalized osteoarthrosis, unspecified site    GERD (gastroesophageal reflux disease)    diet controlled, No meds   Hiatal hernia    Hypertension    Impacted cerumen 11/07/2011   Obesity, unspecified    Other diseases of nasal cavity and sinuses(478.19)    Pain in joint, ankle and foot    Pain in joint, pelvic region and thigh    Pain in joint, shoulder region    Reflux esophagitis    SVD (spontaneous vaginal delivery)    x 2   Unspecified essential hypertension    Unspecified vitamin D deficiency      CURRENT MEDICATIONS: Reviewed  Patient's Medications  New Prescriptions   No medications on file  Previous Medications   ASPIRIN 81 MG TABLET    Take 81 mg by mouth daily.   ATORVASTATIN (LIPITOR) 20 MG TABLET    Take 1 tablet (20 mg total) by mouth at bedtime.   CALCIUM CARBONATE-VITAMIN D (CALCIUM-VITAMIN D) 500-200 MG-UNIT PER TABLET    Take 1 tablet by mouth daily.   CARBOXYMETHYLCELL-HYPROMELLOSE 0.25-0.3 % GEL    Apply 1 application to eye daily. to alleviate irritation.   CHOLECALCIFEROL (VITAMIN D-3 PO)  Take 1 tablet by mouth daily.   HYDROCORTISONE (ANUSOL-HC) 25 MG SUPPOSITORY    Place 1 suppository (25 mg total) rectally daily as needed for hemorrhoids or anal itching.   LOSARTAN-HYDROCHLOROTHIAZIDE (HYZAAR) 50-12.5 MG TABLET    Take 1 tablet by mouth daily.   PROTEIN POWD    Take 1 Scoop by mouth daily.   SIMETHICONE (MYLICON) 80 MG CHEWABLE TABLET    Chew 80 mg by mouth as needed for flatulence.   TURMERIC PO    Take 1 tablet by mouth as needed. Also taking turmeric liquid supplement   VITAMIN B-12 (CYANOCOBALAMIN) 500 MCG TABLET    Take 500 mcg by mouth daily.   VITAMIN C (ASCORBIC ACID) 500 MG TABLET    Take 500 mg by mouth daily.   VITAMIN E 400 UNIT CAPSULE    Take 400 Units by mouth daily.  Modified Medications   No medications on  file  Discontinued Medications   No medications on file     No Known Allergies   REVIEW OF SYSTEMS:  GENERAL: no change in appetite, no fatigue, no weight changes, no fever, chills or weakness SKIN: Denies rash, itching, wounds, ulcer sores, or nail abnormality EYES: Denies change in vision, dry eyes, eye pain, itching or discharge EARS: Denies change in hearing, ringing in ears, or earache NOSE: Denies nasal congestion or epistaxis MOUTH and THROAT: no hoarseness   RESPIRATORY: has productivehas cough with greenish phlegm, wheezing CARDIAC: no chest pain, edema or palpitations GI: no abdominal pain, diarrhea, constipation, heart burn, nausea or vomiting GU: Denies dysuria, frequency, hematuria, incontinence, or discharge MUSCULOSKELETAL: Denies joit pain, muscle pain, back pain, restricted movement, or unusual weakness NEUROLOGICAL: Denies dizziness, syncope, numbness, or headache PSYCHIATRIC: Denies feeling of depression or anxiety. No report of hallucinations, insomnia, paranoia, or agitation   LABS/RADIOLOGY: Labs reviewed: Basic Metabolic Panel: Recent Labs    10/05/22 0933 04/23/23 0947  NA 140 141  K 3.9 4.2  CL 106 109  CO2 27 25  GLUCOSE 102* 105*  BUN 15 10  CREATININE 1.08* 1.07*  CALCIUM 9.3 9.3   Liver Function Tests: Recent Labs    10/05/22 0933 04/23/23 0947  AST 25 24  ALT 16 16  BILITOT 0.7 0.4  PROT 6.4 5.8*   No results for input(s): "LIPASE", "AMYLASE" in the last 8760 hours. No results for input(s): "AMMONIA" in the last 8760 hours. CBC: Recent Labs    10/05/22 0933 04/23/23 0947  WBC 7.6 6.4  NEUTROABS 4,142 3,405  HGB 12.5 12.3  HCT 36.6 37.0  MCV 92.0 92.5  PLT 210 206   A1C: Invalid input(s): "A1C" Lipid Panel: Recent Labs    10/05/22 0933 04/23/23 0947  HDL 63 55   Cardiac Enzymes: No results for input(s): "CKTOTAL", "CKMB", "CKMBINDEX", "TROPONINI" in the last 8760 hours. BNP: Invalid input(s): "POCBNP" CBG: No  results for input(s): "GLUCAP" in the last 8760 hours.    No results found.  ASSESSMENT/PLAN:  1. Acute bronchitis, unspecified organism (Primary) -  has wheezing with dark greenish productive cough - azithromycin (ZITHROMAX) 250 MG tablet; Take 2 tablets on day 1, then 1 tablet daily on days 2 through 5  Dispense: 6 tablet; Refill: 0 - albuterol (VENTOLIN HFA) 108 (90 Base) MCG/ACT inhaler; Inhale 2 puffs into the lungs every 6 (six) hours as needed for wheezing or shortness of breath.  Dispense: 8 g; Refill: 0 - guaiFENesin-dextromethorphan (ROBITUSSIN DM) 100-10 MG/5ML syrup; Take 10 mLs by mouth every 6 (six)  hours as needed for cough. -  encouraged to drink plenty of fluids -  do steam inhalations -  eat healthy balanced diet    Time spent on non face to face visit:  12 minutes  The patient gave consent to this video visit. Explained to the patient the risk and privacy issue that was involved with this video call.   The patient was advised to call back and ask for an in-person evaluation if the symptoms worsen or if the condition fails to improve.   Kenard Gower, NP [employer] [phone]

## 2023-09-14 NOTE — Patient Instructions (Signed)
Acute Bronchitis, Adult  Acute bronchitis is when air tubes in the lungs (bronchi) suddenly get swollen. The condition can make it hard for you to breathe. In adults, acute bronchitis usually goes away within 2 weeks. A cough caused by bronchitis may last up to 3 weeks. Smoking, allergies, and asthma can make the condition worse. What are the causes? Germs that cause cold and flu (viruses). The most common cause of this condition is the virus that causes the common cold. Bacteria. Substances that bother (irritate) the lungs, including: Smoke from cigarettes and other types of tobacco. Dust and pollen. Fumes from chemicals, gases, or burned fuel. Indoor or outdoor air pollution. What increases the risk? A weak body's defense system. This is also called the immune system. Any condition that affects your lungs and breathing, such as asthma. What are the signs or symptoms? A cough. Coughing up clear, yellow, or green mucus. Making high-pitched whistling sounds when you breathe, most often when you breathe out (wheezing). Runny or stuffy nose. Having too much mucus in your lungs (chest congestion). Shortness of breath. Body aches. A sore throat. How is this treated? Acute bronchitis may go away over time without treatment. Your doctor may tell you to: Drink more fluids. This will help thin your mucus so it is easier to cough up. Use a device that gets medicine into your lungs (inhaler). Use a vaporizer or a humidifier. These are machines that add water to the air. This helps with coughing and poor breathing. Take a medicine that thins mucus and helps clear it from your lungs. Take a medicine that prevents or stops coughing. It is not common to take an antibiotic medicine for this condition. Follow these instructions at home:  Take over-the-counter and prescription medicines only as told by your doctor. Use an inhaler, vaporizer, or humidifier as told by your doctor. Take two teaspoons  (10 mL) of honey at bedtime. This helps lessen your coughing at night. Drink enough fluid to keep your pee (urine) pale yellow. Do not smoke or use any products that contain nicotine or tobacco. If you need help quitting, ask your doctor. Get a lot of rest. Return to your normal activities when your doctor says that it is safe. Keep all follow-up visits. How is this prevented?  Wash your hands often with soap and water for at least 20 seconds. If you cannot use soap and water, use hand sanitizer. Avoid contact with people who have cold symptoms. Try not to touch your mouth, nose, or eyes with your hands. Avoid breathing in smoke or chemical fumes. Make sure to get the flu shot every year. Contact a doctor if: Your symptoms do not get better in 2 weeks. You have trouble coughing up the mucus. Your cough keeps you awake at night. You have a fever. Get help right away if: You cough up blood. You have chest pain. You have very bad shortness of breath. You faint or keep feeling like you are going to faint. You have a very bad headache. Your fever or chills get worse. These symptoms may be an emergency. Get help right away. Call your local emergency services (911 in the [place] Do not wait to see if the symptoms will go away. Do not drive yourself to the hospital. Summary Acute bronchitis is when air tubes in the lungs (bronchi) suddenly get swollen. In adults, acute bronchitis usually goes away within 2 weeks. Drink more fluids. This will help thin your mucus so it is easier  to cough up. Take over-the-counter and prescription medicines only as told by your doctor. Contact a doctor if your symptoms do not improve after 2 weeks of treatment. This information is not intended to replace advice given to you by your health care provider. Make sure you discuss any questions you have with your health care provider. Document Revised: 01/19/2021 Document Reviewed: 01/19/2021 Elsevier Patient  Education  2024 [employer].

## 2023-10-29 ENCOUNTER — Other Ambulatory Visit: Payer: Medicare PPO

## 2023-10-29 DIAGNOSIS — I10 Essential (primary) hypertension: Secondary | ICD-10-CM

## 2023-10-29 DIAGNOSIS — N1831 Chronic kidney disease, stage 3a: Secondary | ICD-10-CM

## 2023-10-29 DIAGNOSIS — E1122 Type 2 diabetes mellitus with diabetic chronic kidney disease: Secondary | ICD-10-CM | POA: Diagnosis not present

## 2023-10-30 ENCOUNTER — Encounter: Payer: Self-pay | Admitting: Nurse Practitioner

## 2023-10-30 LAB — COMPLETE METABOLIC PANEL WITH GFR
AG Ratio: 1.7 (calc) (ref 1.0–2.5)
ALT: 15 U/L (ref 6–29)
AST: 22 U/L (ref 10–35)
Albumin: 4.2 g/dL (ref 3.6–5.1)
Alkaline phosphatase (APISO): 106 U/L (ref 37–153)
BUN/Creatinine Ratio: 12 (calc) (ref 6–22)
BUN: 13 mg/dL (ref 7–25)
CO2: 27 mmol/L (ref 20–32)
Calcium: 9.5 mg/dL (ref 8.6–10.4)
Chloride: 105 mmol/L (ref 98–110)
Creat: 1.08 mg/dL — ABNORMAL HIGH (ref 0.60–0.95)
Globulin: 2.5 g/dL (ref 1.9–3.7)
Glucose, Bld: 102 mg/dL — ABNORMAL HIGH (ref 65–99)
Potassium: 4.2 mmol/L (ref 3.5–5.3)
Sodium: 142 mmol/L (ref 135–146)
Total Bilirubin: 0.7 mg/dL (ref 0.2–1.2)
Total Protein: 6.7 g/dL (ref 6.1–8.1)
eGFR: 52 mL/min/{1.73_m2} — ABNORMAL LOW (ref >=60)

## 2023-10-30 LAB — CBC WITH DIFFERENTIAL/PLATELET
Absolute Lymphocytes: 3030 {cells}/uL (ref 850–3900)
Absolute Monocytes: 690 {cells}/uL (ref 200–950)
Basophils Absolute: 30 {cells}/uL (ref 0–200)
Basophils Relative: 0.4 %
Eosinophils Absolute: 150 {cells}/uL (ref 15–500)
Eosinophils Relative: 2 %
HCT: 40.9 % (ref 35.0–45.0)
Hemoglobin: 13.3 g/dL (ref 11.7–15.5)
MCH: 30.5 pg (ref 27.0–33.0)
MCHC: 32.5 g/dL (ref 32.0–36.0)
MCV: 93.8 fL (ref 80.0–100.0)
MPV: 10.9 fL (ref 7.5–12.5)
Monocytes Relative: 9.2 %
Neutro Abs: 3600 {cells}/uL (ref 1500–7800)
Neutrophils Relative %: 48 %
Platelets: 188 10*3/uL (ref 140–400)
RBC: 4.36 10*6/uL (ref 3.80–5.10)
RDW: 13.7 % (ref 11.0–15.0)
Total Lymphocyte: 40.4 %
WBC: 7.5 10*3/uL (ref 3.8–10.8)

## 2023-10-30 LAB — HEMOGLOBIN A1C
Hgb A1c MFr Bld: 6.3 %{Hb} — ABNORMAL HIGH (ref <5.7)
Mean Plasma Glucose: 134 mg/dL
eAG (mmol/L): 7.4 mmol/L

## 2023-11-02 ENCOUNTER — Ambulatory Visit: Payer: Medicare PPO | Admitting: Nurse Practitioner

## 2023-11-02 ENCOUNTER — Encounter: Payer: Self-pay | Admitting: Nurse Practitioner

## 2023-11-02 VITALS — BP 128/87 | HR 75 | Temp 96.6°F | Resp 18 | Ht 64.08 in | Wt 252.0 lb

## 2023-11-02 DIAGNOSIS — Z6841 Body Mass Index (BMI) 40.0 and over, adult: Secondary | ICD-10-CM | POA: Diagnosis not present

## 2023-11-02 DIAGNOSIS — N1831 Chronic kidney disease, stage 3a: Secondary | ICD-10-CM

## 2023-11-02 DIAGNOSIS — E785 Hyperlipidemia, unspecified: Secondary | ICD-10-CM

## 2023-11-02 DIAGNOSIS — E66813 Obesity, class 3: Secondary | ICD-10-CM

## 2023-11-02 DIAGNOSIS — E559 Vitamin D deficiency, unspecified: Secondary | ICD-10-CM | POA: Diagnosis not present

## 2023-11-02 DIAGNOSIS — E1122 Type 2 diabetes mellitus with diabetic chronic kidney disease: Secondary | ICD-10-CM

## 2023-11-02 DIAGNOSIS — I10 Essential (primary) hypertension: Secondary | ICD-10-CM | POA: Diagnosis not present

## 2023-11-02 NOTE — Progress Notes (Signed)
Careteam: Patient Care Team: Sharon Seller, NP as PCP - General (Geriatric Medicine) Valeria Batman, MD (Inactive) as Consulting Physician (Orthopedic Surgery) Cherlyn Roberts, MD as Consulting Physician (Dermatology) Iva Boop, MD as Consulting Physician (Gastroenterology) Sinda Du, MD as Consulting Physician (Ophthalmology)  PLACE OF SERVICE:  [facility]  Advanced Directive information Does Patient Have a Medical Advance Directive?: Yes, Type of Advance Directive: Healthcare Power of [place];Living will, Does patient want to make changes to medical advance directive?: No - Patient declined No Known Allergies  Chief Complaint  Patient presents with   Medical Management of Chronic Issues    6 months follow-up   Immunizations    Shingrix   Health Maintenance    Dexa Scan, eye and foot exam   HPI: Patient is a 83 y.o. female who comes in for her 6 month routine chronic visit. She has gained almost 10 lbs since her last visit in October 2024. She states she has been eating more sweets for dessert and has gained weight due to the holidays. She was sick in December 2024 with a cold and had stopped going to the [facility]. She occasionally hears wheezing in her breathing since her illness. She states she will resume exercising at the [facility] soon. She is drinking protein powder shakes but does not feel any different. She has had her yearly eye exam in November 2024. We requested a report to update our records. She also explains she has had a bone density exam in 2024 for which we requested an updated report for our records.   She complains of discomfort to her right elbow- she has had surgery on this arm previously. She plans to return to her orthopedist for this issue.  Her son lives with her and she has good family support. She continues to be independent, drives, and prepares her own meals.   She denies any issues obtaining and taking her medications. She does mention she  needs to drink more water. She denies any shortness of breath chest pain, headache, nausea, vomiting, constipation, and diarrhea.  Review of Systems:  Review of Systems  Constitutional: Negative.   HENT: Negative.    Eyes: Negative.   Respiratory:  Negative for cough and wheezing.   Cardiovascular:  Negative for leg swelling.  Gastrointestinal: Negative.   Genitourinary: Negative.   Musculoskeletal:  Positive for joint pain.  Skin: Negative.   Neurological: Negative.   Endo/Heme/Allergies: Negative.   Psychiatric/Behavioral: Negative.  Negative for depression.    Past Medical History:  Diagnosis Date   Arthritis    Ankle   Asymptomatic varicose veins    Benign neoplasm of colon    Bilateral shoulder pain 07/02/14   Cataract    Bilateral - just watching   Cervicitis and endocervicitis 09/13/2011   Disorder of bone and cartilage, unspecified    Disorders of bursae and tendons in shoulder region, unspecified    External hemorrhoids without mention of complication    Fibrocystic breast    Generalized osteoarthrosis, unspecified site    GERD (gastroesophageal reflux disease)    diet controlled, No meds   Hiatal hernia    Hypertension    Impacted cerumen 11/07/2011   Obesity, unspecified    Other diseases of nasal cavity and sinuses(478.19)    Pain in joint, ankle and foot    Pain in joint, pelvic region and thigh    Pain in joint, shoulder region    Reflux esophagitis    SVD (spontaneous vaginal delivery)  x 2   Unspecified essential hypertension    Unspecified vitamin D deficiency    Past Surgical History:  Procedure Laterality Date   COLONOSCOPY  2006   Dr.Orr   COLONOSCOPY  07/08/2013   Dr. Leone Payor   FLEXIBLE SIGMOIDOSCOPY  1990   Hemorrhoids    MOUTH SURGERY  2005   DR LUTINS - tooth ext and gum surgery   SHOULDER ARTHROSCOPY W/ ACROMIAL REPAIR Right 07/02/14   Dr. Cleophas Dunker   UPPER GASTROINTESTINAL ENDOSCOPY     normal   Social History:   reports that  she has never smoked. She has never used smokeless tobacco. She reports current alcohol use. She reports that she does not use drugs.  Family History  Problem Relation Age of Onset   Hypertension Mother    Kidney disease Mother        Renal failure   Cancer Brother        Lymphoma   ADD / ADHD Son    Seizures Son    Hypertension Sister    Dementia Sister    Hypertension Sister    Hypertension Sister    Cancer - Other Sister    Early death Brother        Some type of accident   Colon cancer Father 36   Stomach cancer Neg Hx    Rectal cancer Neg Hx    Medications: Patient's Medications  New Prescriptions   No medications on file  Previous Medications   ALBUTEROL (VENTOLIN HFA) 108 (90 BASE) MCG/ACT INHALER    Inhale 2 puffs into the lungs every 6 (six) hours as needed for wheezing or shortness of breath.   ASPIRIN 81 MG TABLET    Take 81 mg by mouth daily.   ATORVASTATIN (LIPITOR) 20 MG TABLET    Take 1 tablet (20 mg total) by mouth at bedtime.   CALCIUM CARBONATE-VITAMIN D (CALCIUM-VITAMIN D) 500-200 MG-UNIT PER TABLET    Take 1 tablet by mouth daily.   CARBOXYMETHYLCELL-HYPROMELLOSE 0.25-0.3 % GEL    Apply 1 application to eye daily. to alleviate irritation.   CHOLECALCIFEROL (VITAMIN D-3 PO)    Take 1 tablet by mouth daily.   GUAIFENESIN-DEXTROMETHORPHAN (ROBITUSSIN DM) 100-10 MG/5ML SYRUP    Take 10 mLs by mouth every 6 (six) hours as needed for cough.   HYDROCORTISONE (ANUSOL-HC) 25 MG SUPPOSITORY    Place 1 suppository (25 mg total) rectally daily as needed for hemorrhoids or anal itching.   LOSARTAN-HYDROCHLOROTHIAZIDE (HYZAAR) 50-12.5 MG TABLET    Take 1 tablet by mouth daily.   PROTEIN POWD    Take 1 Scoop by mouth daily.   SIMETHICONE (MYLICON) 80 MG CHEWABLE TABLET    Chew 80 mg by mouth as needed for flatulence.   TURMERIC PO    Take 1 tablet by mouth as needed. Also taking turmeric liquid supplement   VITAMIN B-12 (CYANOCOBALAMIN) 500 MCG TABLET    Take 500 mcg by  mouth daily.   VITAMIN C (ASCORBIC ACID) 500 MG TABLET    Take 500 mg by mouth daily.   VITAMIN E 400 UNIT CAPSULE    Take 400 Units by mouth daily.  Modified Medications   No medications on file  Discontinued Medications   No medications on file   Physical Exam:  Vitals:   11/02/23 1000  BP: 128/87  Pulse: 75  Resp: 18  Temp: (!) 96.6 F (35.9 C)  SpO2: 95%  Weight: 252 lb (114.3 kg)  Height: 5' 4.08" (1.628  m)   Body mass index is 43.15 kg/m. Wt Readings from Last 3 Encounters:  11/02/23 252 lb (114.3 kg)  07/16/23 243 lb (110.2 kg)  04/27/23 248 lb (112.5 kg)   Physical Exam Vitals reviewed.  Constitutional:      Appearance: Normal appearance. She is obese.  HENT:     Head: Normocephalic and atraumatic.     Right Ear: External ear normal.     Left Ear: External ear normal.     Nose: Nose normal.     Mouth/Throat:     Mouth: Mucous membranes are moist.     Pharynx: Oropharynx is clear.  Eyes:     Conjunctiva/sclera: Conjunctivae normal.  Cardiovascular:     Rate and Rhythm: Normal rate and regular rhythm.     Pulses: Normal pulses.          Dorsalis pedis pulses are 2+ on the right side and 2+ on the left side.     Heart sounds: Normal heart sounds.  Pulmonary:     Effort: Pulmonary effort is normal.     Breath sounds: Normal breath sounds.  Abdominal:     General: Bowel sounds are normal.     Tenderness: There is no abdominal tenderness.  Musculoskeletal:     Cervical back: Neck supple.     Right lower leg: Edema present.     Left lower leg: Edema present.  Feet:     Right foot:     Protective Sensation: 10 sites tested.  10 sites sensed.     Skin integrity: Dry skin present.     Left foot:     Protective Sensation: 10 sites tested.  10 sites sensed.     Skin integrity: Dry skin present.  Skin:    General: Skin is warm and dry.  Neurological:     General: No focal deficit present.     Mental Status: She is alert and oriented to person, place, and  time. Mental status is at baseline.  Psychiatric:        Mood and Affect: Mood normal.        Behavior: Behavior normal.   Labs reviewed: Basic Metabolic Panel: Recent Labs    04/23/23 0947 10/29/23 0928  NA 141 142  K 4.2 4.2  CL 109 105  CO2 25 27  GLUCOSE 105* 102*  BUN 10 13  CREATININE 1.07* 1.08*  CALCIUM 9.3 9.5   Liver Function Tests: Recent Labs    04/23/23 0947 10/29/23 0928  AST 24 22  ALT 16 15  BILITOT 0.4 0.7  PROT 5.8* 6.7   No results for input(s): "LIPASE", "AMYLASE" in the last 8760 hours. No results for input(s): "AMMONIA" in the last 8760 hours. CBC: Recent Labs    04/23/23 0947 10/29/23 0928  WBC 6.4 7.5  NEUTROABS 3,405 3,600  HGB 12.3 13.3  HCT 37.0 40.9  MCV 92.5 93.8  PLT 206 188   Lipid Panel: Recent Labs    04/23/23 0947  CHOL 141  HDL 55  LDLCALC 74  TRIG 50  CHOLHDL 2.6   TSH: No results for input(s): "TSH" in the last 8760 hours. A1C: Lab Results  Component Value Date   HGBA1C 6.3 (H) 10/29/2023   Assessment/Plan  1. Type 2 diabetes mellitus with stage 3a chronic kidney disease, without long-term current use of insulin (HCC) (Primary) - Stable, glucose 102 on 10/29/23 CMP check - A1C down to 6.3 from 6.4 in July 2024 - Discussed decreasing sweets for  dessert -Encouraged dietary compliance, routine foot care/monitoring and to keep up with diabetic eye exams through ophthalmology   2. Essential hypertension - Stable, BP 128/87 today - Continue Hyzaar as prescribed - Discussed importance of heart healthy low sodium diet. - Small swelling in bilateral lower extremities, advised patient to wear compression stockings.  3. Hyperlipidemia, unspecified hyperlipidemia type - Stable, continue atorvastatin as prescribed. - Recheck lipid panel in 6 months.  4. Class 3 severe obesity due to excess calories with serious comorbidity and body mass index (BMI) of 45.0 to 49.9 in adult (HCC) - BMI 43.15 today - Patient advised  to restart exercising at her local [employer] 3 times a week. - Discussed heart healthy low sodium and carb diet.  5. Stage 3a chronic kidney disease (HCC) - Stable, denies any urinary changes. - Advised patient to increase daily water intake.  - CMP checked on 10/29/23, Creatinine 1.08 and eGFR 52. - Discussed low salt diet.  6. Vitamin D deficiency - Stable, denies any fatigue. - Continue Vitamin D supplement daily.  Return in 6 months for routine follow up.  Lenord Fellers, RN DNP-AGPCNP Student -I personally was present during the history, physical exam and medical decision-making activities of this service and have verified that the service and findings are accurately documented in the student's note Janara Klett K. Biagio Borg [facility] [phone]

## 2023-11-05 ENCOUNTER — Telehealth: Payer: Self-pay | Admitting: Nurse Practitioner

## 2023-11-05 ENCOUNTER — Encounter: Payer: Self-pay | Admitting: Nurse Practitioner

## 2023-11-05 NOTE — Telephone Encounter (Signed)
Tried calling patient. Voicemail not set up, so could not leave message.

## 2023-11-05 NOTE — Telephone Encounter (Signed)
Pts bone density reveals osteopenia, thinning of the bone. Recommended to take calcium 600 mg twice daily with Vitamin D 2000 units daily and weight bearing activity 30 mins/5 days a week

## 2023-11-05 NOTE — Telephone Encounter (Signed)
-----   Message from [facility] Brita Romp sent at 11/05/2023  9:03 AM EST ----- Abstracted to patient's chart.

## 2023-11-07 NOTE — Telephone Encounter (Signed)
Attempted to call this patient. Voicemail not accepting messages

## 2024-01-31 ENCOUNTER — Ambulatory Visit: Payer: Self-pay

## 2024-01-31 ENCOUNTER — Ambulatory Visit: Admitting: Orthopedic Surgery

## 2024-01-31 ENCOUNTER — Encounter: Payer: Self-pay | Admitting: Orthopedic Surgery

## 2024-01-31 ENCOUNTER — Ambulatory Visit: Admitting: Physician Assistant

## 2024-01-31 VITALS — BP 132/78 | HR 71 | Temp 97.5°F | Resp 18 | Ht 64.08 in | Wt 250.6 lb

## 2024-01-31 DIAGNOSIS — R1111 Vomiting without nausea: Secondary | ICD-10-CM | POA: Diagnosis not present

## 2024-01-31 DIAGNOSIS — R42 Dizziness and giddiness: Secondary | ICD-10-CM | POA: Diagnosis not present

## 2024-01-31 LAB — COMPREHENSIVE METABOLIC PANEL WITH GFR
AG Ratio: 2.2 (calc) (ref 1.0–2.5)
ALT: 14 U/L (ref 6–29)
AST: 23 U/L (ref 10–35)
Albumin: 4.3 g/dL (ref 3.6–5.1)
Alkaline phosphatase (APISO): 105 U/L (ref 37–153)
BUN/Creatinine Ratio: 11 (calc) (ref 6–22)
BUN: 12 mg/dL (ref 7–25)
CO2: 30 mmol/L (ref 20–32)
Calcium: 9.5 mg/dL (ref 8.6–10.4)
Chloride: 108 mmol/L (ref 98–110)
Creat: 1.05 mg/dL — ABNORMAL HIGH (ref 0.60–0.95)
Globulin: 2 g/dL (ref 1.9–3.7)
Glucose, Bld: 79 mg/dL (ref 65–139)
Potassium: 4.2 mmol/L (ref 3.5–5.3)
Sodium: 144 mmol/L (ref 135–146)
Total Bilirubin: 0.7 mg/dL (ref 0.2–1.2)
Total Protein: 6.3 g/dL (ref 6.1–8.1)
eGFR: 53 mL/min/{1.73_m2} — ABNORMAL LOW (ref >=60)

## 2024-01-31 LAB — CBC WITH DIFFERENTIAL/PLATELET
Absolute Lymphocytes: 2997 {cells}/uL (ref 850–3900)
Absolute Monocytes: 864 {cells}/uL (ref 200–950)
Basophils Absolute: 36 {cells}/uL (ref 0–200)
Basophils Relative: 0.4 %
Eosinophils Absolute: 72 {cells}/uL (ref 15–500)
Eosinophils Relative: 0.8 %
HCT: 39.6 % (ref 35.0–45.0)
Hemoglobin: 12.9 g/dL (ref 11.7–15.5)
MCH: 30.3 pg (ref 27.0–33.0)
MCHC: 32.6 g/dL (ref 32.0–36.0)
MCV: 93 fL (ref 80.0–100.0)
MPV: 10.8 fL (ref 7.5–12.5)
Monocytes Relative: 9.6 %
Neutro Abs: 5031 {cells}/uL (ref 1500–7800)
Neutrophils Relative %: 55.9 %
Platelets: 208 10*3/uL (ref 140–400)
RBC: 4.26 10*6/uL (ref 3.80–5.10)
RDW: 14 % (ref 11.0–15.0)
Total Lymphocyte: 33.3 %
WBC: 9 10*3/uL (ref 3.8–10.8)

## 2024-01-31 NOTE — Telephone Encounter (Signed)
 Message routed to Amy, NP as FYI patient has been added to schedule for today

## 2024-01-31 NOTE — Progress Notes (Signed)
 Careteam: Patient Care Team: Verma Gobble, NP as PCP - General (Geriatric Medicine) Shirlee Dotter, MD (Inactive) as Consulting Physician (Orthopedic Surgery) Eduardo Grade, MD as Consulting Physician (Dermatology) Kenney Peacemaker, MD as Consulting Physician (Gastroenterology) Cindra Cree, MD as Consulting Physician (Ophthalmology)  Seen by: Ulyses Gandy, AGNP-C  PLACE OF SERVICE:  [facility]  Advanced Directive information    No Known Allergies  Chief Complaint  Patient presents with   Dizziness     Dizziness     HPI: Patient is a 83 y.o. female seen today for acute visit due to dizziness.   Discussed the use of AI scribe software for clinical note transcription with the patient, who gave verbal consent to proceed.  History of Present Illness    Danielle Pierce is an 83 year old female who presents with dizziness and vomiting after eating a salad.  04/30 She experienced dizziness and vomiting after consuming salad at [facility]. When she returned home, she began to feel dizziness and eventually had one episode of vomiting. Symptoms improved after becoming sick. No vomiting has occurred today. She managed to eat half a sandwich with spam light and tolerated it well. She has been drinking juice, coffee, and water without further vomiting. She reports feeling hot and flushed but denies having a fever.  She has diabetes but is not currently taking any medication for it. A follow-up appointment is scheduled in July for diabetes management.     Review of Systems:  Review of Systems  Constitutional:  Negative for malaise/fatigue.  HENT:  Negative for congestion and sore throat.   Respiratory:  Negative for sputum production.   Cardiovascular:  Negative for chest pain.  Gastrointestinal:  Positive for diarrhea, nausea and vomiting. Negative for abdominal pain, blood in stool, constipation and heartburn.  Genitourinary:  Negative for dysuria.   Musculoskeletal:  Negative for falls and joint pain.  Skin:  Negative for rash.  Neurological:  Positive for dizziness. Negative for weakness.  Psychiatric/Behavioral:  Negative for depression. The patient is not nervous/anxious.     Past Medical History:  Diagnosis Date   Arthritis    Ankle   Asymptomatic varicose veins    Benign neoplasm of colon    Bilateral shoulder pain 07/02/14   Cataract    Bilateral - just watching   Cervicitis and endocervicitis 09/13/2011   Disorder of bone and cartilage, unspecified    Disorders of bursae and tendons in shoulder region, unspecified    External hemorrhoids without mention of complication    Fibrocystic breast    Generalized osteoarthrosis, unspecified site    GERD (gastroesophageal reflux disease)    diet controlled, No meds   Hiatal hernia    Hypertension    Impacted cerumen 11/07/2011   Obesity, unspecified    Other diseases of nasal cavity and sinuses(478.19)    Pain in joint, ankle and foot    Pain in joint, pelvic region and thigh    Pain in joint, shoulder region    Reflux esophagitis    SVD (spontaneous vaginal delivery)    x 2   Unspecified essential hypertension    Unspecified vitamin D  deficiency    Past Surgical History:  Procedure Laterality Date   COLONOSCOPY  2006   Dr.Orr   COLONOSCOPY  07/08/2013   Dr. Willy Harvest   FLEXIBLE SIGMOIDOSCOPY  1990   Hemorrhoids    MOUTH SURGERY  2005   DR LUTINS - tooth ext and gum surgery  SHOULDER ARTHROSCOPY W/ ACROMIAL REPAIR Right 07/02/14   Dr. Aviva Lemmings   UPPER GASTROINTESTINAL ENDOSCOPY     normal   Social History:   reports that she has never smoked. She has never used smokeless tobacco. She reports current alcohol use. She reports that she does not use drugs.  Family History  Problem Relation Age of Onset   Hypertension Mother    Kidney disease Mother        Renal failure   Cancer Brother        Lymphoma   ADD / ADHD Son    Seizures Son    Hypertension  Sister    Dementia Sister    Hypertension Sister    Hypertension Sister    Cancer - Other Sister    Early death Brother        Some type of accident   Colon cancer Father 65   Stomach cancer Neg Hx    Rectal cancer Neg Hx     Medications: Patient's Medications  New Prescriptions   No medications on file  Previous Medications   ALBUTEROL  (VENTOLIN  HFA) 108 (90 BASE) MCG/ACT INHALER    Inhale 2 puffs into the lungs every 6 (six) hours as needed for wheezing or shortness of breath.   ASPIRIN 81 MG TABLET    Take 81 mg by mouth daily.   ATORVASTATIN  (LIPITOR) 20 MG TABLET    Take 1 tablet (20 mg total) by mouth at bedtime.   CALCIUM  CARBONATE-VITAMIN D  (CALCIUM -VITAMIN D ) 500-200 MG-UNIT PER TABLET    Take 1 tablet by mouth daily.   CARBOXYMETHYLCELL-HYPROMELLOSE 0.25-0.3 % GEL    Apply 1 application to eye daily. to alleviate irritation.   CHOLECALCIFEROL (VITAMIN D -3 PO)    Take 1 tablet by mouth daily.   GUAIFENESIN -DEXTROMETHORPHAN (ROBITUSSIN DM) 100-10 MG/5ML SYRUP    Take 10 mLs by mouth every 6 (six) hours as needed for cough.   HYDROCORTISONE  (ANUSOL -HC) 25 MG SUPPOSITORY    Place 1 suppository (25 mg total) rectally daily as needed for hemorrhoids or anal itching.   LOSARTAN -HYDROCHLOROTHIAZIDE  (HYZAAR) 50-12.5 MG TABLET    Take 1 tablet by mouth daily.   PROTEIN POWD    Take 1 Scoop by mouth daily.   SIMETHICONE  (MYLICON) 80 MG CHEWABLE TABLET    Chew 80 mg by mouth as needed for flatulence.   TURMERIC PO    Take 1 tablet by mouth as needed. Also taking turmeric liquid supplement   VITAMIN B-12 (CYANOCOBALAMIN) 500 MCG TABLET    Take 500 mcg by mouth daily.   VITAMIN C (ASCORBIC ACID) 500 MG TABLET    Take 500 mg by mouth daily.   VITAMIN E 400 UNIT CAPSULE    Take 400 Units by mouth daily.  Modified Medications   No medications on file  Discontinued Medications   No medications on file    Physical Exam:  Vitals:   01/31/24 1428  BP: 132/78  Pulse: 71  Resp: 18   Temp: (!) 97.5 F (36.4 C)  SpO2: 91%  Weight: 250 lb 9.6 oz (113.7 kg)  Height: 5' 4.08" (1.628 m)   Body mass index is 42.91 kg/m. Wt Readings from Last 3 Encounters:  01/31/24 250 lb 9.6 oz (113.7 kg)  11/02/23 252 lb (114.3 kg)  07/16/23 243 lb (110.2 kg)    Physical Exam Vitals reviewed.  Constitutional:      General: She is not in acute distress.    Appearance: She is obese.  HENT:  Head: Normocephalic.     Nose: Nose normal.     Mouth/Throat:     Mouth: Mucous membranes are moist.  Eyes:     General:        Right eye: No discharge.     Extraocular Movements: Extraocular movements intact.     Pupils: Pupils are equal, round, and reactive to light.  Cardiovascular:     Rate and Rhythm: Normal rate and regular rhythm.     Pulses: Normal pulses.     Heart sounds: Normal heart sounds.  Pulmonary:     Effort: Pulmonary effort is normal.     Breath sounds: Normal breath sounds.  Abdominal:     General: Bowel sounds are normal. There is no distension.     Palpations: Abdomen is soft. There is no mass.     Tenderness: There is no abdominal tenderness. There is no guarding or rebound.     Hernia: No hernia is present.  Musculoskeletal:     Cervical back: Neck supple.     Right lower leg: No edema.     Left lower leg: No edema.  Skin:    General: Skin is warm.     Capillary Refill: Capillary refill takes less than 2 seconds.  Neurological:     General: No focal deficit present.     Mental Status: She is alert and oriented to person, place, and time.  Psychiatric:        Mood and Affect: Mood normal.     Labs reviewed: Basic Metabolic Panel: Recent Labs    04/23/23 0947 10/29/23 0928  NA 141 142  K 4.2 4.2  CL 109 105  CO2 25 27  GLUCOSE 105* 102*  BUN 10 13  CREATININE 1.07* 1.08*  CALCIUM  9.3 9.5   Liver Function Tests: Recent Labs    04/23/23 0947 10/29/23 0928  AST 24 22  ALT 16 15  BILITOT 0.4 0.7  PROT 5.8* 6.7   No results for  input(s): "LIPASE", "AMYLASE" in the last 8760 hours. No results for input(s): "AMMONIA" in the last 8760 hours. CBC: Recent Labs    04/23/23 0947 10/29/23 0928  WBC 6.4 7.5  NEUTROABS 3,405 3,600  HGB 12.3 13.3  HCT 37.0 40.9  MCV 92.5 93.8  PLT 206 188   Lipid Panel: Recent Labs    04/23/23 0947  CHOL 141  HDL 55  LDLCALC 74  TRIG 50  CHOLHDL 2.6   TSH: No results for input(s): "TSH" in the last 8760 hours. A1C: Lab Results  Component Value Date   HGBA1C 6.3 (H) 10/29/2023     Assessment/plan:  1. Dizziness (Primary) - one episode 04/30 a few hours dizziness after eating bad salad - symptoms improved after vomiting - no falls - exam unremarkable, neuro exam normal, vitals stable - will check blood counts> unremarkable - CBC with Differential/Platelet  2. Vomiting without nausea, unspecified vomiting type - see above - ? Food poisoning - labs unremarkable> no indication of fluid loss dehydration - encourage hydration with water and electrolyte drink - recommend bland foods for a few days> avoid acidic, spicy or fried foods - Comprehensive metabolic panel with GFR  Total time: 28 minutes. Greater than 50% of total time spent doing patient education regarding dizziness and vomiting including symptom/medication management.     Next appt: Visit date not found  Nili Honda Darral Ellis  [facility] [phone]

## 2024-01-31 NOTE — Telephone Encounter (Signed)
 Copied from CRM [id number]. Topic: Clinical - Red Word Triage >> Jan 31, 2024  8:42 AM Retta Caster wrote: Red Word that prompted transfer to Nurse Triage:  Ate something bad had diarrhea/Dizzy   Chief Complaint: Dizziness  Symptoms: Dizziness, Vomiting, Numbness in the Hands  Frequency: Since Yesterday  Pertinent Negatives: Patient denies chest pain, dyspnea  Disposition: [] ED /[] Urgent Care (no appt availability in office) / [x] Appointment(In office/virtual)/ []  [facility]/ [] Home Care/ [] Refused Recommended Disposition /[] [facility]/ []  Follow-up with PCP  Additional Notes: MW is being triaged today for dizziness that occurred yesterday. The patient reports being able to keep fluids down and ability to drink fluids. Denies acute distress like symptoms, in office appointment made for today with another provider in office.   Reason for Disposition  [1] MODERATE dizziness (e.g., interferes with normal activities) AND [2] has NOT been evaluated by doctor (or NP/PA) for this  (Exception: Dizziness caused by heat exposure, sudden standing, or poor fluid intake.)  Answer Assessment - Initial Assessment Questions 1. DESCRIPTION: "Describe your dizziness."     Weakness like sensations  2. LIGHTHEADED: "Do you feel lightheaded?" (e.g., somewhat faint, woozy, weak upon standing)     Woozy, Weak upon standing  3. VERTIGO: "Do you feel like either you or the room is spinning or tilting?" (i.e. vertigo)     Denies room spinning or tilting  4. SEVERITY: "How bad is it?"  "Do you feel like you are going to faint?" "Can you stand and walk?"   - MILD: Feels slightly dizzy, but walking normally.   - MODERATE: Feels unsteady when walking, but not falling; interferes with normal activities (e.g., school, work).   - SEVERE: Unable to walk without falling, or requires assistance to walk without falling; feels like passing out now.      Moderate  5. ONSET:  "When did the dizziness  begin?"     Since Yesterday  6. AGGRAVATING FACTORS: "Does anything make it worse?" (e.g., standing, change in head position)     Standing, Turning her head  7. HEART RATE: "Can you tell me your heart rate?" "How many beats in 15 seconds?"  (Note: not all patients can do this)       Unsure  8. CAUSE: "What do you think is causing the dizziness?"     Unsure  9. RECURRENT SYMPTOM: "Have you had dizziness before?" If Yes, ask: "When was the last time?" "What happened that time?"     No  10. OTHER SYMPTOMS: "Do you have any other symptoms?" (e.g., fever, chest pain, vomiting, diarrhea, bleeding)       Vomiting, Diarrhea  11. PREGNANCY: "Is there any chance you are pregnant?" "When was your last menstrual period?"       No and No  Protocols used: Dizziness - Lightheadedness-A-AH

## 2024-01-31 NOTE — Patient Instructions (Signed)
 Recommend bland diet for next few days> apples, bananas, rice, toast,   Please drink electrolyte drink> gator aid, vitamin water, liquid ID  Please let us  know if you feel worse

## 2024-02-08 ENCOUNTER — Ambulatory Visit: Admitting: Physician Assistant

## 2024-02-08 ENCOUNTER — Other Ambulatory Visit (INDEPENDENT_AMBULATORY_CARE_PROVIDER_SITE_OTHER): Payer: Self-pay

## 2024-02-08 DIAGNOSIS — M542 Cervicalgia: Secondary | ICD-10-CM

## 2024-02-08 NOTE — Progress Notes (Signed)
Xr

## 2024-02-08 NOTE — Progress Notes (Signed)
 Office Visit Note   Patient: Danielle Pierce           Date of Birth: 08-02-1941           MRN: [id number] Visit Date: 02/08/2024              Requested by: Verma Gobble, NP [address] [place]. [place],  [place] [address] PCP: Verma Gobble, NP   Assessment & Plan: Visit Diagnoses:  1. Neck pain     Plan: Patient is a pleasant 83 year old woman who comes in today with a history of neck pain.  No particular injury.  Does sometimes go down her arms and causes some numbness.  She also noticed that she has less motion of her neck when she turns her head side-to-side.  She has not really taken any medicine for this.  Just some Tylenol arthritis.  She tries to stay away from anti-inflammatories.  X-rays and exam today consistent with advanced degenerative changes of her cervical spine.  She has almost complete loss of joint space at multiple levels with sclerotic changes.  I recommended to her trying a course of physical therapy first.  As she does not have any significant weakness.  If she does not get better would consider an MRI and possible epidural steroid injections  Follow-Up Instructions: Return if symptoms worsen or fail to improve.   Orders:  Orders Placed This Encounter  Procedures   XR Cervical Spine 2 or 3 views   Ambulatory referral to Physical Therapy   No orders of the defined types were placed in this encounter.     Procedures: No procedures performed   Clinical Data: No additional findings.   Subjective: Chief Complaint  Patient presents with   Neck - Pain    HPI pleasant 83 year old woman comes in today with a chief complaint of neck pain and stiffness.  Denies any specific injury not really had problems in the past.  Denies any fever chills  Review of Systems  All other systems reviewed and are negative.    Objective: Vital Signs: There were no vitals taken for this visit.  Physical Exam Constitutional:      Appearance: Normal  appearance.  Pulmonary:     Effort: Pulmonary effort is normal.  Skin:    General: Skin is warm and dry.  Neurological:     General: No focal deficit present.     Mental Status: She is alert and oriented to person, place, and time.  Psychiatric:        Mood and Affect: Mood normal.        Behavior: Behavior normal.     Ortho Exam Examination of her neck there is no step-off deformities she has some loss of motion but not much pain with extension and flexion of her neck she is got again not much pain but definite deficit in motion when turning her neck head side-to-side.  Grip strength is intact she has good biceps triceps strength.  She does have some limited motion of her left shoulder consistent with her previous diagnosis of osteoarthritis Specialty Comments:  No specialty comments available.  Imaging: XR Cervical Spine 2 or 3 views Result Date: 02/08/2024 2 views of her cervical spine shows advanced degenerative changes of the cervical spine with loss of normal lordotic curve with virtually no space.  She has endplate osteophyte sclerotic changes no acute changes    PMFS History: Patient Active Problem List   Diagnosis Date Noted  Trigger thumb, right thumb 07/27/2020   Body mass index (BMI) of 40.1-44.9 in adult [facility]) 11/14/2018   Localized osteoarthritis of left knee 05/10/2017   HLD (hyperlipidemia) 01/19/2016   Excessive cerumen in both ear canals 05/18/2015   Lumbago 04/08/2014   Internal and external bleeding hemorrhoids 07/08/2013   DM (diabetes mellitus), type 2 with renal complications (HCC) 02/20/2013   Essential hypertension 02/20/2013   Obesity    Vitamin D  deficiency    Past Medical History:  Diagnosis Date   Arthritis    Ankle   Asymptomatic varicose veins    Benign neoplasm of colon    Bilateral shoulder pain 07/02/14   Cataract    Bilateral - just watching   Cervicitis and endocervicitis 09/13/2011   Disorder of bone and cartilage, unspecified     Disorders of bursae and tendons in shoulder region, unspecified    External hemorrhoids without mention of complication    Fibrocystic breast    Generalized osteoarthrosis, unspecified site    GERD (gastroesophageal reflux disease)    diet controlled, No meds   Hiatal hernia    Hypertension    Impacted cerumen 11/07/2011   Obesity, unspecified    Other diseases of nasal cavity and sinuses(478.19)    Pain in joint, ankle and foot    Pain in joint, pelvic region and thigh    Pain in joint, shoulder region    Reflux esophagitis    SVD (spontaneous vaginal delivery)    x 2   Unspecified essential hypertension    Unspecified vitamin D  deficiency     Family History  Problem Relation Age of Onset   Hypertension Mother    Kidney disease Mother        Renal failure   Cancer Brother        Lymphoma   ADD / ADHD Son    Seizures Son    Hypertension Sister    Dementia Sister    Hypertension Sister    Hypertension Sister    Cancer - Other Sister    Early death Brother        Some type of accident   Colon cancer Father 89   Stomach cancer Neg Hx    Rectal cancer Neg Hx     Past Surgical History:  Procedure Laterality Date   COLONOSCOPY  2006   Dr.Orr   COLONOSCOPY  07/08/2013   Dr. Willy Harvest   FLEXIBLE SIGMOIDOSCOPY  1990   Hemorrhoids    MOUTH SURGERY  2005   DR LUTINS - tooth ext and gum surgery   SHOULDER ARTHROSCOPY W/ ACROMIAL REPAIR Right 07/02/14   Dr. Aviva Lemmings   UPPER GASTROINTESTINAL ENDOSCOPY     normal   Social History   Occupational History   Occupation: retired Camera operator  Tobacco Use   Smoking status: Never   Smokeless tobacco: Never  Vaping Use   Vaping status: Never Used  Substance and Sexual Activity   Alcohol use: Yes    Comment: occasional mixed drink    Drug use: No   Sexual activity: Not Currently    Birth control/protection: Post-menopausal

## 2024-02-11 ENCOUNTER — Ambulatory Visit: Admitting: Nurse Practitioner

## 2024-02-11 ENCOUNTER — Encounter: Payer: Self-pay | Admitting: Nurse Practitioner

## 2024-02-11 VITALS — BP 130/80 | HR 72 | Temp 96.9°F | Ht 64.0 in | Wt 250.0 lb

## 2024-02-11 DIAGNOSIS — N1831 Chronic kidney disease, stage 3a: Secondary | ICD-10-CM | POA: Diagnosis not present

## 2024-02-11 DIAGNOSIS — E1122 Type 2 diabetes mellitus with diabetic chronic kidney disease: Secondary | ICD-10-CM

## 2024-02-11 DIAGNOSIS — M4722 Other spondylosis with radiculopathy, cervical region: Secondary | ICD-10-CM

## 2024-02-11 MED ORDER — OZEMPIC (0.25 OR 0.5 MG/DOSE) 2 MG/3ML ~~LOC~~ SOPN: 0.2500 mg | PEN_INJECTOR | SUBCUTANEOUS | 1 refills | Status: DC

## 2024-02-11 NOTE — Patient Instructions (Addendum)
 Shingrix  vaccine- to get at your local pharmacy   To start ozempic 0.25 mg injection weekly

## 2024-02-11 NOTE — Progress Notes (Signed)
 Careteam: Patient Care Team: Verma Gobble, NP as PCP - General (Geriatric Medicine) Shirlee Dotter, MD (Inactive) as Consulting Physician (Orthopedic Surgery) Eduardo Grade, MD as Consulting Physician (Dermatology) Kenney Peacemaker, MD as Consulting Physician (Gastroenterology) Cindra Cree, MD as Consulting Physician (Ophthalmology)  PLACE OF SERVICE:  [facility]  Advanced Directive information    No Known Allergies  Chief Complaint  Patient presents with   Follow-up    Discuss results and she had an appt with ortho. She said that she was set up to PT outpateint , she hasn't started yet.  She was told that x-ray of neck was arthrosis ,     HPI:  Discussed the use of AI scribe software for clinical note transcription with the patient, who gave verbal consent to proceed.  History of Present Illness Danielle Pierce is an 83 year old female with chronic kidney disease stage 3A and diabetes who presents for a routine follow-up and review of blood work.  She initially experienced dizziness, which has since resolved completely. No current dizziness or nausea. She suspected food poisoning at the time of the dizziness.  Request lab work from earlier in the month Her blood work revealed slightly impaired kidney function, consistent with her chronic kidney disease stage 3A.   She mentions numbness and discomfort in her neck and arms, attributing it to arthritis. She experiences soreness when lying on her arms and discomfort radiating up her neck. Has plan for PT/OT  She expresses concern about weight gain despite efforts to lose weight.      Review of Systems:  Review of Systems  Constitutional:  Negative for chills, fever and weight loss.  HENT:  Negative for tinnitus.   Respiratory:  Negative for cough, sputum production and shortness of breath.   Cardiovascular:  Negative for chest pain, palpitations and leg swelling.  Gastrointestinal:  Negative for  abdominal pain, constipation, diarrhea and heartburn.  Genitourinary:  Negative for dysuria, frequency and urgency.  Musculoskeletal:  Negative for back pain, falls, joint pain and myalgias.  Skin: Negative.   Neurological:  Negative for dizziness and headaches.  Psychiatric/Behavioral:  Negative for depression and memory loss. The patient does not have insomnia.     Past Medical History:  Diagnosis Date   Arthritis    Ankle   Asymptomatic varicose veins    Benign neoplasm of colon    Bilateral shoulder pain 07/02/14   Cataract    Bilateral - just watching   Cervicitis and endocervicitis 09/13/2011   Disorder of bone and cartilage, unspecified    Disorders of bursae and tendons in shoulder region, unspecified    External hemorrhoids without mention of complication    Fibrocystic breast    Generalized osteoarthrosis, unspecified site    GERD (gastroesophageal reflux disease)    diet controlled, No meds   Hiatal hernia    Hypertension    Impacted cerumen 11/07/2011   Obesity, unspecified    Other diseases of nasal cavity and sinuses(478.19)    Pain in joint, ankle and foot    Pain in joint, pelvic region and thigh    Pain in joint, shoulder region    Reflux esophagitis    SVD (spontaneous vaginal delivery)    x 2   Unspecified essential hypertension    Unspecified vitamin D  deficiency    Past Surgical History:  Procedure Laterality Date   COLONOSCOPY  2006   Dr.Orr   COLONOSCOPY  07/08/2013   Dr. Willy Harvest  FLEXIBLE SIGMOIDOSCOPY  1990   Hemorrhoids    MOUTH SURGERY  2005   DR LUTINS - tooth ext and gum surgery   SHOULDER ARTHROSCOPY W/ ACROMIAL REPAIR Right 07/02/14   Dr. Aviva Lemmings   UPPER GASTROINTESTINAL ENDOSCOPY     normal   Social History:   reports that she has never smoked. She has never used smokeless tobacco. She reports current alcohol use. She reports that she does not use drugs.  Family History  Problem Relation Age of Onset   Hypertension Mother     Kidney disease Mother        Renal failure   Cancer Brother        Lymphoma   ADD / ADHD Son    Seizures Son    Hypertension Sister    Dementia Sister    Hypertension Sister    Hypertension Sister    Cancer - Other Sister    Early death Brother        Some type of accident   Colon cancer Father 86   Stomach cancer Neg Hx    Rectal cancer Neg Hx     Medications: Patient's Medications  New Prescriptions   SEMAGLUTIDE,0.25 OR 0.5MG /DOS, (OZEMPIC, 0.25 OR 0.5 MG/DOSE,) 2 MG/3ML SOPN    Inject 0.25 mg into the skin once a week.  Previous Medications   ALBUTEROL  (VENTOLIN  HFA) 108 (90 BASE) MCG/ACT INHALER    Inhale 2 puffs into the lungs every 6 (six) hours as needed for wheezing or shortness of breath.   ASPIRIN 81 MG TABLET    Take 81 mg by mouth daily.   ATORVASTATIN  (LIPITOR) 20 MG TABLET    Take 1 tablet (20 mg total) by mouth at bedtime.   CALCIUM  CARBONATE-VITAMIN D  (CALCIUM -VITAMIN D ) 500-200 MG-UNIT PER TABLET    Take 1 tablet by mouth daily.   CARBOXYMETHYLCELL-HYPROMELLOSE 0.25-0.3 % GEL    Apply 1 application to eye daily. to alleviate irritation.   CHOLECALCIFEROL (VITAMIN D -3 PO)    Take 1 tablet by mouth daily.   GUAIFENESIN -DEXTROMETHORPHAN (ROBITUSSIN DM) 100-10 MG/5ML SYRUP    Take 10 mLs by mouth every 6 (six) hours as needed for cough.   HYDROCORTISONE  (ANUSOL -HC) 25 MG SUPPOSITORY    Place 1 suppository (25 mg total) rectally daily as needed for hemorrhoids or anal itching.   LOSARTAN -HYDROCHLOROTHIAZIDE  (HYZAAR) 50-12.5 MG TABLET    Take 1 tablet by mouth daily.   PROTEIN POWD    Take 1 Scoop by mouth daily.   SIMETHICONE  (MYLICON) 80 MG CHEWABLE TABLET    Chew 80 mg by mouth as needed for flatulence.   TURMERIC PO    Take 1 tablet by mouth as needed. Also taking turmeric liquid supplement   VITAMIN B-12 (CYANOCOBALAMIN) 500 MCG TABLET    Take 500 mcg by mouth daily.   VITAMIN C (ASCORBIC ACID) 500 MG TABLET    Take 500 mg by mouth daily.   VITAMIN E 400 UNIT  CAPSULE    Take 400 Units by mouth daily.  Modified Medications   No medications on file  Discontinued Medications   No medications on file    Physical Exam:  Vitals:   02/11/24 1145  BP: 130/80  Pulse: 72  Temp: (!) 96.9 F (36.1 C)  TempSrc: Temporal  SpO2: 92%  Weight: 250 lb (113.4 kg)  Height: 5\' 4"  (1.626 m)   Body mass index is 42.91 kg/m. Wt Readings from Last 3 Encounters:  02/11/24 250 lb (113.4 kg)  01/31/24 250 lb 9.6 oz (113.7 kg)  11/02/23 252 lb (114.3 kg)    Physical Exam Constitutional:      General: She is not in acute distress.    Appearance: She is well-developed. She is not diaphoretic.  HENT:     Head: Normocephalic and atraumatic.     Mouth/Throat:     Pharynx: No oropharyngeal exudate.  Eyes:     Conjunctiva/sclera: Conjunctivae normal.     Pupils: Pupils are equal, round, and reactive to light.  Cardiovascular:     Rate and Rhythm: Normal rate and regular rhythm.     Heart sounds: Normal heart sounds.  Pulmonary:     Effort: Pulmonary effort is normal.     Breath sounds: Normal breath sounds.  Abdominal:     General: Bowel sounds are normal.     Palpations: Abdomen is soft.  Musculoskeletal:        General: No tenderness.     Cervical back: Normal range of motion and neck supple.  Skin:    General: Skin is warm and dry.  Neurological:     Mental Status: She is alert and oriented to person, place, and time.    Labs reviewed: Basic Metabolic Panel: Recent Labs    04/23/23 0947 10/29/23 0928 01/31/24 1456  NA 141 142 144  K 4.2 4.2 4.2  CL 109 105 108  CO2 25 27 30   GLUCOSE 105* 102* 79  BUN 10 13 12   CREATININE 1.07* 1.08* 1.05*  CALCIUM  9.3 9.5 9.5   Liver Function Tests: Recent Labs    04/23/23 0947 10/29/23 0928 01/31/24 1456  AST 24 22 23   ALT 16 15 14   BILITOT 0.4 0.7 0.7  PROT 5.8* 6.7 6.3   No results for input(s): "LIPASE", "AMYLASE" in the last 8760 hours. No results for input(s): "AMMONIA" in the  last 8760 hours. CBC: Recent Labs    04/23/23 0947 10/29/23 0928 01/31/24 1456  WBC 6.4 7.5 9.0  NEUTROABS 3,405 3,600 5,031  HGB 12.3 13.3 12.9  HCT 37.0 40.9 39.6  MCV 92.5 93.8 93.0  PLT 206 188 208   Lipid Panel: Recent Labs    04/23/23 0947  CHOL 141  HDL 55  LDLCALC 74  TRIG 50  CHOLHDL 2.6   TSH: No results for input(s): "TSH" in the last 8760 hours. A1C: Lab Results  Component Value Date   HGBA1C 6.3 (H) 10/29/2023     Assessment/Plan 1. Osteoarthritis of spine with radiculopathy, cervical region (Primary) Ongoing, PT has been ordered  2. Type 2 diabetes mellitus with stage 3a chronic kidney disease, without long-term current use of insulin (HCC) Encouraged dietary compliance, routine foot care/monitoring and to keep up with diabetic eye exams through ophthalmology  -due to diabetes and morbid obesity will start ozempic  -discussed maintaining hydration  - Semaglutide,0.25 or 0.5MG /DOS, (OZEMPIC, 0.25 OR 0.5 MG/DOSE,) 2 MG/3ML SOPN; Inject 0.25 mg into the skin once a week.  Dispense: 3 mL; Refill: 1  3. Morbid obesity (HCC) --education provided on healthy weight loss through increase in physical activity and proper nutrition    To keep follow up on 04/28/2024 Labs prior    Helio Lack K. Denney Fisherman [facility] [phone]

## 2024-03-10 ENCOUNTER — Encounter: Payer: Self-pay | Admitting: Physical Therapy

## 2024-03-10 ENCOUNTER — Ambulatory Visit: Admitting: Physical Therapy

## 2024-03-10 DIAGNOSIS — M6281 Muscle weakness (generalized): Secondary | ICD-10-CM

## 2024-03-10 DIAGNOSIS — R293 Abnormal posture: Secondary | ICD-10-CM

## 2024-03-10 DIAGNOSIS — M5412 Radiculopathy, cervical region: Secondary | ICD-10-CM | POA: Diagnosis not present

## 2024-03-10 NOTE — Therapy (Signed)
 OUTPATIENT PHYSICAL THERAPY EVALUATION   Patient Name: Danielle Pierce MRN: [id number] DOB:12-Nov-1940, 83 y.o., female Today's Date: 03/10/2024  END OF SESSION:  PT End of Session - 03/10/24 1508     Visit Number 1    Number of Visits 6    Date for PT Re-Evaluation 04/21/24    Authorization Type [employer] $20 copay    Progress Note Due on Visit 10    PT Start Time 1422    PT Stop Time 1500    PT Time Calculation (min) 38 min    Activity Tolerance Patient tolerated treatment well    Behavior During Therapy WFL for tasks assessed/performed             Past Medical History:  Diagnosis Date   Arthritis    Ankle   Asymptomatic varicose veins    Benign neoplasm of colon    Bilateral shoulder pain 07/02/14   Cataract    Bilateral - just watching   Cervicitis and endocervicitis 09/13/2011   Disorder of bone and cartilage, unspecified    Disorders of bursae and tendons in shoulder region, unspecified    External hemorrhoids without mention of complication    Fibrocystic breast    Generalized osteoarthrosis, unspecified site    GERD (gastroesophageal reflux disease)    diet controlled, No meds   Hiatal hernia    Hypertension    Impacted cerumen 11/07/2011   Obesity, unspecified    Other diseases of nasal cavity and sinuses(478.19)    Pain in joint, ankle and foot    Pain in joint, pelvic region and thigh    Pain in joint, shoulder region    Reflux esophagitis    SVD (spontaneous vaginal delivery)    x 2   Unspecified essential hypertension    Unspecified vitamin D  deficiency    Past Surgical History:  Procedure Laterality Date   COLONOSCOPY  2006   Dr.Orr   COLONOSCOPY  07/08/2013   Dr. Willy Harvest   FLEXIBLE SIGMOIDOSCOPY  1990   Hemorrhoids    MOUTH SURGERY  2005   DR LUTINS - tooth ext and gum surgery   SHOULDER ARTHROSCOPY W/ ACROMIAL REPAIR Right 07/02/14   Dr. Aviva Lemmings   UPPER GASTROINTESTINAL ENDOSCOPY     normal   Patient Active Problem List    Diagnosis Date Noted   Trigger thumb, right thumb 07/27/2020   Body mass index (BMI) of 40.1-44.9 in adult (HCC) 11/14/2018   Localized osteoarthritis of left knee 05/10/2017   HLD (hyperlipidemia) 01/19/2016   Excessive cerumen in both ear canals 05/18/2015   Lumbago 04/08/2014   Internal and external bleeding hemorrhoids 07/08/2013   DM (diabetes mellitus), type 2 with renal complications (HCC) 02/20/2013   Essential hypertension 02/20/2013   Obesity    Vitamin D  deficiency     PCP: Verma Gobble, NP  REFERRING PROVIDER: Persons, Norma Beckers, PA  REFERRING DIAG: M54.2 (ICD-10-CM) - Neck pain  Rationale for Evaluation and Treatment: Rehabilitation  THERAPY DIAG:  Radiculopathy, cervical region - Plan: PT plan of care cert/re-cert  Abnormal posture - Plan: PT plan of care cert/re-cert  Muscle weakness (generalized) - Plan: PT plan of care cert/re-cert  ONSET DATE: Feb 2025   SUBJECTIVE:  SUBJECTIVE STATEMENT: Patient is a pleasant 83 year old woman who comes in today with a history of neck pain x several months. No particular injury. Does sometimes go down her arms and causes some numbness. She also noticed that she has less motion of her neck when she turns her head side-to-side.  She hasn't had an recent treatment for her neck, and also reports some recent coldness and numbness into her hands.   PERTINENT HISTORY:  OA, HTN, obesity, DM, CKD, Rt shoulder surgery (2015)  PAIN:  Are you having pain? Yes: NPRS scale: 0 currently, up to 5/10 Pain location: neck, radiates into UEs, feels Lt is worse Pain description: dull, discsomfort Aggravating factors: raising/moving arms, turning head to the Lt Relieving factors: avoiding provoking factors  PRECAUTIONS:  None  RED  FLAGS: None   WEIGHT BEARING RESTRICTIONS:  No  FALLS:  Has patient fallen in last 6 months? 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  DIAGNOSTIC FINDINGS:  X-rays consistent with advanced degenerative changes of her cervical spine. She has almost complete loss of joint space at multiple levels with sclerotic changes  PATIENT SURVEYS:  Patient-Specific Activity Scoring Scheme  "0" represents "unable to perform." "10" represents "able to perform at prior level. 0 1 2 3 4 5 6 7 8 9  10 (Date and Score)   Activity Eval     1. Reach hands behind head 5     2. Turning head 5    Score 5    Total score = sum of the activity scores/number of activities Minimum detectable change (90%CI) for average score = 2 points Minimum detectable change (90%CI) for single activity score = 3 points    COGNITIVE STATUS: Within functional limits for tasks assessed   SENSATION: C/o numbness and coldness in hands  POSTURE:  rounded shoulders and forward head  HAND DOMINANCE:  Right  GAIT: 03/10/24 Comments: independent   PALPATION: 03/10/24 trigger points noted in bil upper trap (worse in Lt) and infraspinatus   CERVICAL ROM:   ROM A/PROM (deg) eval  Flexion 28  Extension 30  Right lateral flexion 20  Left lateral flexion 21  Right rotation 35 (mild discomfort)  Left rotation 40 (mild discomfort)   (Blank rows = not tested)   UPPER EXTREMITY ROM:  ROM Right eval Left eval  Shoulder flexion 130 130  Shoulder abduction 120 120  Shoulder internal  rotation To glute To glute  Shoulder external rotation Base of skull Base of skull   (Blank rows = not tested)   UPPER EXTREMITY MMT:  MMT Right eval Left eval  Shoulder flexion 3-/5 3-/5  Shoulder extension    Shoulder abduction  3-/5  Shoulder adduction    Shoulder extension    Shoulder internal rotation  4/5  Shoulder external rotation  3/5  Grip strength     (Blank rows = not tested)    SPECIAL TESTS:  03/10/24 Cervical Spurling's test: Negative and Distraction test: Negative    TREATMENT:  DATE:  03/10/24 TherEx See HEP - demonstrated with trial reps performed PRN, mod cues for comprehension    Self Care Educated on clinical findings and POC, as well as ways for home self mobilization including cane and tennis ball     PATIENT EDUCATION:  Education details: HEP, see above Person educated: Patient Education method: Explanation, Demonstration, and Handouts Education comprehension: verbalized understanding, returned demonstration, and needs further education  HOME EXERCISE PROGRAM: Access Code: [email or web address] URL: [email or web address] Date: 03/10/2024 Prepared by: Casimer Clear  Exercises - Seated Assisted Cervical Rotation with Towel  - 1-2 x daily - 7 x weekly - 1 sets - 10 reps - 5-10 sec hold - Cervical Extension AROM with Strap  - 1-2 x daily - 7 x weekly - 1 sets - 10 reps - 5-10 sec hold - Seated Cervical Retraction  - 2 x daily - 7 x weekly - 1 sets - 10 reps - 5 sec hold - Seated Scapular Retraction  - 2 x daily - 7 x weekly - 1 sets - 10 reps - 5 sec hold - Standing Upper Trapezius Mobilization with Small Ball  - 1 x daily - 7 x weekly - 3 sets - 10 reps   ASSESSMENT:  CLINICAL IMPRESSION: Patient is a 83 y.o. female who was seen today for physical therapy evaluation and treatment for neck pain. She  demonstrates postural abnormalities, decrease strength and ROM as well as continued pain affecting functional mobility.  She will benefit from PT to address deficits listed.   OBJECTIVE IMPAIRMENTS: decreased ROM, decreased strength, hypomobility, increased fascial restrictions, increased muscle spasms, impaired UE functional use, postural dysfunction, and pain.   ACTIVITY LIMITATIONS: lifting, bathing, dressing, reach over head, and hygiene/grooming  PARTICIPATION LIMITATIONS: meal prep, cleaning, laundry, driving, shopping, community activity, and occupation  PERSONAL FACTORS: Time since onset of injury/illness/exacerbation and 3+ comorbidities: OA, HTN, obesity, DM, CKD are also affecting patient's functional outcome.   REHAB POTENTIAL: Good  CLINICAL DECISION MAKING: Evolving/moderate complexity  EVALUATION COMPLEXITY: Moderate   GOALS: Goals reviewed with patient? Yes  SHORT TERM GOALS: Target date: 03/31/2024   Independent with initial HEP Goal status: INITIAL   LONG TERM GOALS: Target date: 04/21/2024   Independent with final HEP Goal status: INITIAL  2.  PSFS score improved by 2 points Goal status: INITIAL  3.  Cervical ROM improved by 10 deg all limited motions for improved function and mobility Goal status: INIITAL  4.  Report pain < 3/10 with reaching and behind head activities for improved function Goal status: INITIAL  5.  Demonstrate ability to get hands behind head and report 50% improvement in grooming tasks (doing her hair) Goal status: INITIAL    PLAN:  PT FREQUENCY: 1x/week  PT DURATION: 6 weeks  PLANNED INTERVENTIONS: [id number]- PT Re-evaluation, 97750- Physical Performance Testing, 97110-Therapeutic exercises, 97530- Therapeutic activity, [id number]- Neuromuscular re-education, 97535- Self Care, [id number]- Manual therapy, [id number]- Aquatic Therapy, [id number]- Electrical stimulation (unattended), [phone]- Traction (mechanical), Patient/Family education, Taping, Joint  mobilization, Joint manipulation, Spinal manipulation, Spinal mobilization, Cryotherapy, and Moist heat.  PLAN FOR NEXT SESSION: Review HEP, progress postural exercises and shoulder strengthening   NEXT MD VISIT: PRN   Marley Simmers, PT, DPT 03/10/24 3:09 PM    Referring diagnosis? M54.2 Treatment diagnosis? (if different than referring diagnosis) M54.12, R29.3, M62.81 What was this (referring dx) caused by? []  Surgery []  Fall [x]  Ongoing issue [x]  Arthritis []  Other: ____________  Laterality: []  Rt []  Lt [x]  Both  Check all possible  CPT codes:  *CHOOSE 10 OR LESS*    See Planned Interventions listed in the Plan section of the Evaluation.

## 2024-03-13 ENCOUNTER — Encounter: Payer: Self-pay | Admitting: Physician Assistant

## 2024-03-13 ENCOUNTER — Ambulatory Visit: Admitting: Physician Assistant

## 2024-03-13 DIAGNOSIS — M542 Cervicalgia: Secondary | ICD-10-CM

## 2024-03-13 DIAGNOSIS — M5412 Radiculopathy, cervical region: Secondary | ICD-10-CM | POA: Diagnosis not present

## 2024-03-13 NOTE — Progress Notes (Signed)
 Office Visit Note   Patient: Danielle Pierce           Date of Birth: [birth year]           MRN: [id number] Visit Date: 03/13/2024              Requested by: Verma Gobble, NP [address] [place] [place],  [place] [address] PCP: Verma Gobble, NP   Assessment & Plan: Visit Diagnoses:  1. Cervicalgia   2. Radiculopathy, cervical region     Plan: Danielle Pierce is a very pleasant 83 year old who I followed in the past.  I saw her at the beginning of May complaining of some neck pain radiating down her arms.  She had x-rays consistent with advanced degenerative changes of her cervical spine.  I referred her to physical therapy.  She has started physical therapy.  She reports now 2-week history of her faint hands falling asleep.  It is in the entire distribution of both her hands has not had this before.  She said sometimes her hands feel cold.  Her grip strength is intact she has a negative Tinel sign.  Would be inconsistent given the history of carpal tunnel.  I like her to continue with physical therapy however because of acute onset I do also like for her to get an MRI of her neck.  Would then refer her to [facility]  Follow-Up Instructions: With Megan after MRI  Orders:  Orders Placed This Encounter  Procedures   MR Cervical Spine w/o contrast   No orders of the defined types were placed in this encounter.     Procedures: No procedures performed   Clinical Data: No additional findings.   Subjective: Bilateral hand paresthesias  HPI Danielle Pierce is a very pleasant 83 year old woman who comes in today complaining of bilateral hand numbness feels like her hands fall asleep.  This is in the distribution of all of her digits.  She says sometimes they feel cold.  She has began physical therapy for her neck denies any fever chills changes in vision or weakness Review of Systems  All other systems reviewed and are negative.    Objective: Vital Signs: There were no vitals taken for  this visit.  Physical Exam Constitutional:      Appearance: Normal appearance.  Pulmonary:     Effort: Pulmonary effort is normal.   Skin:    General: Skin is warm and dry.   Neurological:     General: No focal deficit present.     Mental Status: She is alert and oriented to person, place, and time.   Psychiatric:        Mood and Affect: Mood normal.        Behavior: Behavior normal.    Ortho Exam Bilateral hands her hands are warm with brisk capillary refill she has palpable radial pulses.  She is able to oppose all of her fingers her grip strength is intact.  No asymmetry. Specialty Comments:  No specialty comments available.  Imaging: No results found.   PMFS History: Patient Active Problem List   Diagnosis Date Noted   Radiculopathy, cervical region 03/13/2024   Trigger thumb, right thumb 07/27/2020   Body mass index (BMI) of 40.1-44.9 in adult [facility]) 11/14/2018   Localized osteoarthritis of left knee 05/10/2017   HLD (hyperlipidemia) 01/19/2016   Excessive cerumen in both ear canals 05/18/2015   Lumbago 04/08/2014   Internal and external bleeding hemorrhoids 07/08/2013   DM (diabetes mellitus), type 2  with renal complications (HCC) 02/20/2013   Essential hypertension 02/20/2013   Obesity    Vitamin D  deficiency    Past Medical History:  Diagnosis Date   Arthritis    Ankle   Asymptomatic varicose veins    Benign neoplasm of colon    Bilateral shoulder pain 07/02/14   Cataract    Bilateral - just watching   Cervicitis and endocervicitis 09/13/2011   Disorder of bone and cartilage, unspecified    Disorders of bursae and tendons in shoulder region, unspecified    External hemorrhoids without mention of complication    Fibrocystic breast    Generalized osteoarthrosis, unspecified site    GERD (gastroesophageal reflux disease)    diet controlled, No meds   Hiatal hernia    Hypertension    Impacted cerumen 11/07/2011   Obesity, unspecified    Other  diseases of nasal cavity and sinuses(478.19)    Pain in joint, ankle and foot    Pain in joint, pelvic region and thigh    Pain in joint, shoulder region    Reflux esophagitis    SVD (spontaneous vaginal delivery)    x 2   Unspecified essential hypertension    Unspecified vitamin D  deficiency     Family History  Problem Relation Age of Onset   Hypertension Mother    Kidney disease Mother        Renal failure   Cancer Brother        Lymphoma   ADD / ADHD Son    Seizures Son    Hypertension Sister    Dementia Sister    Hypertension Sister    Hypertension Sister    Cancer - Other Sister    Early death Brother        Some type of accident   Colon cancer Father 52   Stomach cancer Neg Hx    Rectal cancer Neg Hx     Past Surgical History:  Procedure Laterality Date   COLONOSCOPY  2006   Dr.Orr   COLONOSCOPY  07/08/2013   Dr. Willy Harvest   FLEXIBLE SIGMOIDOSCOPY  1990   Hemorrhoids    MOUTH SURGERY  2005   DR LUTINS - tooth ext and gum surgery   SHOULDER ARTHROSCOPY W/ ACROMIAL REPAIR Right 07/02/14   Dr. Aviva Lemmings   UPPER GASTROINTESTINAL ENDOSCOPY     normal   Social History   Occupational History   Occupation: retired Camera operator  Tobacco Use   Smoking status: Never   Smokeless tobacco: Never  Vaping Use   Vaping status: Never Used  Substance and Sexual Activity   Alcohol use: Yes    Comment: occasional mixed drink    Drug use: No   Sexual activity: Not Currently    Birth control/protection: Post-menopausal

## 2024-03-21 ENCOUNTER — Encounter: Payer: Self-pay | Admitting: Orthopedic Surgery

## 2024-03-24 ENCOUNTER — Ambulatory Visit
Admission: RE | Admit: 2024-03-24 | Discharge: 2024-03-24 | Disposition: A | Discharge status: Home / Self Care | Source: Ambulatory Visit | Attending: Physician Assistant | Admitting: Physician Assistant

## 2024-03-24 DIAGNOSIS — M4802 Spinal stenosis, cervical region: Secondary | ICD-10-CM | POA: Diagnosis not present

## 2024-03-24 DIAGNOSIS — M542 Cervicalgia: Secondary | ICD-10-CM

## 2024-03-24 DIAGNOSIS — M47812 Spondylosis without myelopathy or radiculopathy, cervical region: Secondary | ICD-10-CM | POA: Diagnosis not present

## 2024-03-24 DIAGNOSIS — M5021 Other cervical disc displacement,  high cervical region: Secondary | ICD-10-CM | POA: Diagnosis not present

## 2024-03-24 DIAGNOSIS — M5023 Other cervical disc displacement, cervicothoracic region: Secondary | ICD-10-CM | POA: Diagnosis not present

## 2024-03-28 ENCOUNTER — Encounter: Payer: Self-pay | Admitting: Rehabilitative and Restorative Service Providers"

## 2024-03-28 ENCOUNTER — Ambulatory Visit: Admitting: Rehabilitative and Restorative Service Providers"

## 2024-03-28 DIAGNOSIS — R293 Abnormal posture: Secondary | ICD-10-CM | POA: Diagnosis not present

## 2024-03-28 DIAGNOSIS — M5412 Radiculopathy, cervical region: Secondary | ICD-10-CM | POA: Diagnosis not present

## 2024-03-28 DIAGNOSIS — M6281 Muscle weakness (generalized): Secondary | ICD-10-CM | POA: Diagnosis not present

## 2024-03-28 NOTE — Therapy (Addendum)
 OUTPATIENT PHYSICAL THERAPY TREATMENT   Patient Name: Danielle Pierce MRN: [id number] DOB:03-24-1941, 83 y.o., female Today's Date: 03/28/2024  END OF SESSION:  PT End of Session - 03/28/24 0839     Visit Number 2    Number of Visits 6    Date for PT Re-Evaluation 04/21/24    Authorization Type [employer] $20 copay    Progress Note Due on Visit 10    PT Start Time 0839    PT Stop Time 0918    PT Time Calculation (min) 39 min    Activity Tolerance Patient tolerated treatment well    Behavior During Therapy [facility] for tasks assessed/performed           Past Medical History:  Diagnosis Date   Arthritis    Ankle   Asymptomatic varicose veins    Benign neoplasm of colon    Bilateral shoulder pain 07/02/14   Cataract    Bilateral - just watching   Cervicitis and endocervicitis 09/13/2011   Disorder of bone and cartilage, unspecified    Disorders of bursae and tendons in shoulder region, unspecified    External hemorrhoids without mention of complication    Fibrocystic breast    Generalized osteoarthrosis, unspecified site    GERD (gastroesophageal reflux disease)    diet controlled, No meds   Hiatal hernia    Hypertension    Impacted cerumen 11/07/2011   Obesity, unspecified    Other diseases of nasal cavity and sinuses(478.19)    Pain in joint, ankle and foot    Pain in joint, pelvic region and thigh    Pain in joint, shoulder region    Reflux esophagitis    SVD (spontaneous vaginal delivery)    x 2   Unspecified essential hypertension    Unspecified vitamin D  deficiency    Past Surgical History:  Procedure Laterality Date   COLONOSCOPY  2006   Dr.Orr   COLONOSCOPY  07/08/2013   Dr. Avram   FLEXIBLE SIGMOIDOSCOPY  1990   Hemorrhoids    MOUTH SURGERY  2005   DR LUTINS - tooth ext and gum surgery   SHOULDER ARTHROSCOPY W/ ACROMIAL REPAIR Right 07/02/14   Dr. Anderson   UPPER GASTROINTESTINAL ENDOSCOPY     normal   Patient Active Problem List   Diagnosis  Date Noted   Radiculopathy, cervical region 03/13/2024   Trigger thumb, right thumb 07/27/2020   Body mass index (BMI) of 40.1-44.9 in adult (HCC) 11/14/2018   Localized osteoarthritis of left knee 05/10/2017   HLD (hyperlipidemia) 01/19/2016   Excessive cerumen in both ear canals 05/18/2015   Lumbago 04/08/2014   Internal and external bleeding hemorrhoids 07/08/2013   DM (diabetes mellitus), type 2 with renal complications (HCC) 02/20/2013   Essential hypertension 02/20/2013   Obesity    Vitamin D  deficiency     PCP: Caro Harlene POUR, NP  REFERRING PROVIDER: Persons, Ronal Dragon, PA  REFERRING DIAG: M54.2 (ICD-10-CM) - Neck pain  Rationale for Evaluation and Treatment: Rehabilitation  THERAPY DIAG:  Radiculopathy, cervical region  Abnormal posture  Muscle weakness (generalized)  ONSET DATE: Feb 2025   SUBJECTIVE:  SUBJECTIVE STATEMENT: Pt indicated chief concerns about hands and numbness complaints.  Pt indicated no worse with any HEP.  Pt indicated noticing complaints in neck with turning to Lt with tightness/sore.  Reported soreness in arms.    PERTINENT HISTORY:  OA, HTN, obesity, DM, CKD, Rt shoulder surgery (2015)  PAIN:   NPRS scale: neck pain up to 5/10.   Pain location: neck, radiates into UEs, feels Lt is worse Pain description: dull, discsomfort Aggravating factors: raising/moving arms, turning head to the Lt Relieving factors: avoiding provoking factors  PRECAUTIONS:  None  RED FLAGS: None   WEIGHT BEARING RESTRICTIONS:  No  FALLS:  Has patient fallen in last 6 months? 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  DIAGNOSTIC FINDINGS:  X-rays consistent with advanced degenerative changes of her cervical spine. She has almost complete loss of joint space at multiple levels with sclerotic changes  PATIENT SURVEYS:  Patient-Specific Activity Scoring Scheme  0 represents "unable to perform." 10 represents "able to perform at prior level. 0 1 2 3 4 5 6 7 8 9  10 (Date and Score)   Activity Eval     1. Reach hands behind head 5     2. Turning head 5    Score 5    Total score = sum of the activity scores/number of activities Minimum detectable change (90%CI) for average score = 2 points Minimum detectable change (90%CI) for single activity score = 3 points    COGNITIVE STATUS: 03/10/2024 Within functional limits for tasks assessed   SENSATION: 03/10/2024 C/o numbness and coldness in hands  POSTURE:  03/10/2024 rounded shoulders and forward head  HAND DOMINANCE:  03/10/2024 Right  GAIT: 03/10/24 Comments: independent   PALPATION: 03/10/24 trigger points noted in bil upper trap (worse in Lt) and infraspinatus   CERVICAL ROM:   ROM A/PROM (deg) Eval 03/10/2024 AROM 03/28/2024  Flexion 28 35 with no impact on hand symptoms  Extension 30 56 with no impact on hand symptoms   Right lateral flexion 20   Left lateral flexion 21   Right rotation 35 (mild discomfort) 67 with no impact on hand symptoms   Left rotation 40 (mild discomfort) 68 with no impact on hand symptoms    (Blank rows = not tested)   UPPER EXTREMITY ROM:  ROM Right Eval 03/10/2024 Left Eval 03/10/2024  Shoulder flexion 130 130  Shoulder abduction 120 120  Shoulder internal rotation To glute To glute  Shoulder external rotation Base of skull Base  of skull   (Blank rows = not tested)   UPPER EXTREMITY MMT:  MMT Right Eval 03/10/2024 Left Eval 03/10/2024  Shoulder flexion 3-/5 3-/5  Shoulder extension    Shoulder abduction  3-/5  Shoulder adduction    Shoulder extension    Shoulder internal rotation  4/5  Shoulder external rotation  3/5  Grip strength     (Blank rows = not tested)    SPECIAL TESTS:  03/28/2024:  No hand sensation changes noted with cervical movements, cervical distraction.  Pt did report neck symptom improvement with cervical distraction.  03/10/24 Cervical Spurling's test: Negative and Distraction test: Negative                  TREATMENT         DATE: 03/28/2024 Therex: UBE fwd/back 3 mins each way lvl 2.0 with 1 min rest between directions.  Seated cervical AROM x 5 each way for rotation.   Verbal review of existing HEP knowledge, cues for techniques.   Neuro Re-ed (muscle activation postural awareness/activation) Supine cervical retraction isometric 5 sec hold x 10 Supine scapular retraction 5 sec hold x 10 Supine bilateral GH ext isometric 5 sec hold x 10  Standing bilateral shoulder rows c scapular retraction focus green band 2 x 15 Standing bilateral shoudler gh ext green band 2 x 15  Manual Cervical distraction intermittent for cervical relief.  No impact on hand symptoms noted during performance.      TREATMENT         DATE: 03/10/24 TherEx See HEP - demonstrated with trial reps performed PRN, mod cues for comprehension    Self Care Educated on clinical findings and POC, as well as ways for home self mobilization including cane and tennis ball     PATIENT EDUCATION:  Education details: HEP, see above Person educated: Patient Education method: Explanation, Demonstration, and Handouts Education comprehension: verbalized understanding, returned demonstration, and needs further education  HOME EXERCISE PROGRAM: Access Code: [email or web address] URL: [email or web address] Date:  03/10/2024 Prepared by: Corean Ku  Exercises - Seated Assisted Cervical Rotation with Towel  - 1-2 x daily - 7 x weekly - 1 sets - 10 reps - 5-10 sec hold - Cervical Extension AROM with Strap  - 1-2 x daily - 7 x weekly - 1 sets - 10 reps - 5-10 sec hold - Seated Cervical Retraction  - 2 x daily - 7 x weekly - 1 sets - 10 reps - 5 sec hold - Seated Scapular Retraction  - 2 x daily - 7 x weekly - 1 sets - 10 reps - 5 sec hold - Standing Upper Trapezius Mobilization with Small Ball  - 1 x daily - 7 x weekly - 3 sets - 10 reps   ASSESSMENT:  CLINICAL IMPRESSION: Cervical range was improved compared to evaluation.  Waiting to see updates from reports of MRI taken.  Cervical mobility/testing did not impact complaints of bilateral palmar aspect of hands sensation changes.  Continued skilled PT services may be warranted at this time.   OBJECTIVE IMPAIRMENTS: decreased ROM, decreased strength, hypomobility, increased fascial restrictions, increased muscle spasms, impaired UE functional use, postural dysfunction, and pain.   ACTIVITY LIMITATIONS: lifting, bathing, dressing, reach over head, and hygiene/grooming  PARTICIPATION LIMITATIONS: meal prep, cleaning, laundry, driving, shopping, community activity, and occupation  PERSONAL FACTORS: Time since onset of injury/illness/exacerbation and 3+ comorbidities: OA, HTN, obesity, DM, CKD are also affecting patient's functional outcome.   REHAB POTENTIAL: Good  CLINICAL DECISION MAKING: Evolving/moderate complexity  EVALUATION COMPLEXITY: Moderate   GOALS: Goals reviewed with patient? Yes  SHORT TERM GOALS: Target date: 03/31/2024   Independent with initial HEP Goal status: on going 03/28/2024   LONG TERM GOALS: Target date: 04/21/2024   Independent with final HEP Goal status: INITIAL  2.  PSFS score improved by 2 points Goal status: INITIAL  3.  Cervical ROM improved by 10 deg all limited motions for improved function and  mobility Goal status: INIITAL  4.  Report pain < 3/10 with reaching and behind head activities for improved function Goal status:  INITIAL  5.  Demonstrate ability to get hands behind head and report 50% improvement in grooming tasks (doing her hair) Goal status: INITIAL    PLAN:  PT FREQUENCY: 1x/week  PT DURATION: 6 weeks  PLANNED INTERVENTIONS: [id number]- PT Re-evaluation, 97750- Physical Performance Testing, 97110-Therapeutic exercises, 97530- Therapeutic activity, [id number]- Neuromuscular re-education, 97535- Self Care, [id number]- Manual therapy, [id number]- Aquatic Therapy, [id number]- Electrical stimulation (unattended), [phone]- Traction (mechanical), Patient/Family education, Taping, Joint mobilization, Joint manipulation, Spinal manipulation, Spinal mobilization, Cryotherapy, and Moist heat.  PLAN FOR NEXT SESSION: Check for any updates about MRI.    Ozell Silvan, PT, DPT, OCS, ATC 03/28/24  9:25 AM      Referring diagnosis? M54.2 Treatment diagnosis? (if different than referring diagnosis) M54.12, R29.3, M62.81 What was this (referring dx) caused by? []  Surgery []  Fall [x]  Ongoing issue [x]  Arthritis []  Other: ____________  Laterality: []  Rt []  Lt [x]  Both  Check all possible CPT codes:  *CHOOSE 10 OR LESS*    See Planned Interventions listed in the Plan section of the Evaluation.

## 2024-04-01 ENCOUNTER — Ambulatory Visit: Admitting: Physical Therapy

## 2024-04-01 ENCOUNTER — Encounter: Payer: Self-pay | Admitting: Physical Therapy

## 2024-04-01 DIAGNOSIS — R293 Abnormal posture: Secondary | ICD-10-CM | POA: Diagnosis not present

## 2024-04-01 DIAGNOSIS — M6281 Muscle weakness (generalized): Secondary | ICD-10-CM

## 2024-04-01 DIAGNOSIS — M5412 Radiculopathy, cervical region: Secondary | ICD-10-CM

## 2024-04-01 NOTE — Therapy (Signed)
 OUTPATIENT PHYSICAL THERAPY TREATMENT   Patient Name: Danielle Pierce MRN: [id number] DOB:04/28/1941, 83 y.o., female Today's Date: 04/01/2024  END OF SESSION:  PT End of Session - 04/01/24 1053     Visit Number 3    Number of Visits 6    Date for PT Re-Evaluation 04/21/24    Authorization Type [employer] $20 copay    Progress Note Due on Visit 10    PT Start Time 1055    PT Stop Time 1137    PT Time Calculation (min) 42 min    Activity Tolerance Patient tolerated treatment well    Behavior During Therapy WFL for tasks assessed/performed            Past Medical History:  Diagnosis Date   Arthritis    Ankle   Asymptomatic varicose veins    Benign neoplasm of colon    Bilateral shoulder pain 07/02/14   Cataract    Bilateral - just watching   Cervicitis and endocervicitis 09/13/2011   Disorder of bone and cartilage, unspecified    Disorders of bursae and tendons in shoulder region, unspecified    External hemorrhoids without mention of complication    Fibrocystic breast    Generalized osteoarthrosis, unspecified site    GERD (gastroesophageal reflux disease)    diet controlled, No meds   Hiatal hernia    Hypertension    Impacted cerumen 11/07/2011   Obesity, unspecified    Other diseases of nasal cavity and sinuses(478.19)    Pain in joint, ankle and foot    Pain in joint, pelvic region and thigh    Pain in joint, shoulder region    Reflux esophagitis    SVD (spontaneous vaginal delivery)    x 2   Unspecified essential hypertension    Unspecified vitamin D  deficiency    Past Surgical History:  Procedure Laterality Date   COLONOSCOPY  2006   Dr.Orr   COLONOSCOPY  07/08/2013   Dr. Avram   FLEXIBLE SIGMOIDOSCOPY  1990   Hemorrhoids    MOUTH SURGERY  2005   DR LUTINS - tooth ext and gum surgery   SHOULDER ARTHROSCOPY W/ ACROMIAL REPAIR Right 07/02/14   Dr. Anderson   UPPER GASTROINTESTINAL ENDOSCOPY     normal   Patient Active Problem List   Diagnosis  Date Noted   Radiculopathy, cervical region 03/13/2024   Trigger thumb, right thumb 07/27/2020   Body mass index (BMI) of 40.1-44.9 in adult (HCC) 11/14/2018   Localized osteoarthritis of left knee 05/10/2017   HLD (hyperlipidemia) 01/19/2016   Excessive cerumen in both ear canals 05/18/2015   Lumbago 04/08/2014   Internal and external bleeding hemorrhoids 07/08/2013   DM (diabetes mellitus), type 2 with renal complications (HCC) 02/20/2013   Essential hypertension 02/20/2013   Obesity    Vitamin D  deficiency     PCP: Caro Harlene POUR, NP  REFERRING PROVIDER: Persons, Ronal Dragon, PA  REFERRING DIAG: M54.2 (ICD-10-CM) - Neck pain  Rationale for Evaluation and Treatment: Rehabilitation  THERAPY DIAG:  Radiculopathy, cervical region  Abnormal posture  Muscle weakness (generalized)  ONSET DATE: Feb 2025   SUBJECTIVE:  SUBJECTIVE STATEMENT: No change in hands - both are still numb  PERTINENT HISTORY:  OA, HTN, obesity, DM, CKD, Rt shoulder surgery (2015)  PAIN:   NPRS scale: neck pain up to 5/10.   Pain location: neck, radiates into UEs, feels Lt is worse Pain description: dull, discsomfort Aggravating factors: raising/moving arms, turning head to the Lt Relieving factors: avoiding provoking factors  PRECAUTIONS:  None  RED FLAGS: None   WEIGHT BEARING RESTRICTIONS:  No  FALLS:  Has patient fallen in last 6 months? 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  DIAGNOSTIC FINDINGS:  X-rays consistent with advanced degenerative changes of her cervical spine. She has almost complete loss of joint space at multiple levels with sclerotic changes  PATIENT SURVEYS:  Patient-Specific Activity Scoring Scheme  0 represents "unable to perform." 10 represents "able to perform at prior level. 0 1 2 3 4 5 6 7 8 9  10 (Date and Score)   Activity Eval     1. Reach hands behind head 5     2. Turning head 5    Score 5    Total score = sum of the activity scores/number of activities Minimum detectable change (90%CI) for average score = 2 points Minimum detectable change (90%CI) for single activity score = 3 points    COGNITIVE STATUS: 03/10/2024 Within functional limits for tasks assessed   SENSATION: 03/10/2024 C/o numbness and coldness in hands  POSTURE:  03/10/2024 rounded shoulders and forward head  HAND DOMINANCE:  03/10/2024 Right  GAIT: 03/10/24 Comments: independent   PALPATION: 03/10/24 trigger points noted in bil upper trap (worse in Lt) and infraspinatus   CERVICAL ROM:   ROM A/PROM (deg) Eval 03/10/2024 AROM 03/28/2024  Flexion 28 35 with no impact on hand symptoms  Extension 30 56 with no impact on hand symptoms   Right lateral flexion 20   Left lateral flexion 21   Right rotation 35 (mild discomfort) 67 with no impact on hand symptoms   Left rotation 40 (mild discomfort) 68 with no impact on hand symptoms    (Blank rows = not tested)   UPPER EXTREMITY ROM:  ROM Right Eval 03/10/2024 Left Eval 03/10/2024  Shoulder flexion 130 130  Shoulder abduction 120 120  Shoulder internal rotation To glute To glute  Shoulder external rotation Base of skull Base of skull   (Blank rows = not tested)   UPPER EXTREMITY MMT:  MMT Right Eval 03/10/2024 Left Eval 03/10/2024  Shoulder flexion 3-/5 3-/5  Shoulder extension     Shoulder abduction  3-/5  Shoulder adduction    Shoulder extension    Shoulder internal rotation  4/5  Shoulder external rotation  3/5  Grip strength     (Blank rows = not tested)    SPECIAL TESTS:  03/28/2024:  No hand sensation changes noted with cervical movements, cervical distraction.  Pt did report neck symptom improvement with cervical distraction.   03/10/24 Cervical Spurling's test: Negative and Distraction test: Negative  TREATMENT 04/01/24 Neuro Re Ed Nerve tension testing with tightness noted in median nerve, less in ulnar nerve bil Median nerve glide 10 x 5 sec hold bil Prayer stretch 3x15 sec  TherEx Wrist flexor stretch 3x15 sec bil; hand against counter Seated wrist flexion x10 with 2# weight bil  Manual IASTM with biofreeze to bil wrist flexors including compression to trigger points    03/28/2024 Therex: UBE fwd/back 3 mins each way lvl 2.0 with 1 min rest between directions.  Seated cervical AROM x 5 each way for rotation.   Verbal review of existing HEP knowledge, cues for techniques.   Neuro Re-ed (muscle activation postural awareness/activation) Supine cervical retraction isometric 5 sec hold x 10 Supine scapular retraction 5 sec hold x 10 Supine bilateral GH ext isometric 5 sec hold x 10  Standing bilateral shoulder rows c scapular retraction focus green band 2 x 15 Standing bilateral shoudler gh ext green band 2 x 15  Manual Cervical distraction intermittent for cervical relief.  No impact on hand symptoms noted during performance.      03/10/24 TherEx See HEP - demonstrated with trial reps performed PRN, mod cues for comprehension    Self Care Educated on clinical findings and POC, as well as ways for home self mobilization including cane and tennis ball     PATIENT EDUCATION:  Education details: HEP, see above Person educated: Patient Education method: Explanation, Demonstration, and Handouts Education  comprehension: verbalized understanding, returned demonstration, and needs further education  HOME EXERCISE PROGRAM: Access Code: [email or web address] URL: [email or web address] Date: 03/10/2024 Prepared by: Corean Ku  Exercises - Seated Assisted Cervical Rotation with Towel  - 1-2 x daily - 7 x weekly - 1 sets - 10 reps - 5-10 sec hold - Cervical Extension AROM with Strap  - 1-2 x daily - 7 x weekly - 1 sets - 10 reps - 5-10 sec hold - Seated Cervical Retraction  - 2 x daily - 7 x weekly - 1 sets - 10 reps - 5 sec hold - Seated Scapular Retraction  - 2 x daily - 7 x weekly - 1 sets - 10 reps - 5 sec hold - Standing Upper Trapezius Mobilization with Small Ball  - 1 x daily - 7 x weekly - 3 sets - 10 reps   Access Code: [email or web address] URL: [email or web address] Date: 04/01/2024 Prepared by: Corean Ku  Exercises - Median Nerve Tensioner  - 3 x daily - 7 x weekly - 1 sets - 5 reps - 3-5 sec hold - Wrist Prayer Stretch  - 3 x daily - 7 x weekly - 1 sets - 3 reps - 15 sec hold   ASSESSMENT:  CLINICAL IMPRESSION: Pt tolerated session well today, symptoms not improved with neck exercises at this time so trial of wrist exercises.  Will continue to benefit from PT to maximize function.   OBJECTIVE IMPAIRMENTS: decreased ROM, decreased strength, hypomobility, increased fascial restrictions, increased muscle spasms, impaired UE functional use, postural dysfunction, and pain.   ACTIVITY LIMITATIONS: lifting, bathing, dressing, reach over head, and hygiene/grooming  PARTICIPATION LIMITATIONS: meal prep, cleaning, laundry, driving, shopping, community activity, and occupation  PERSONAL FACTORS: Time since onset of injury/illness/exacerbation and 3+ comorbidities: OA, HTN, obesity, DM, CKD are also affecting patient's functional outcome.   REHAB POTENTIAL: Good  CLINICAL DECISION MAKING: Evolving/moderate complexity  EVALUATION COMPLEXITY:  Moderate   GOALS: Goals reviewed with patient? Yes  SHORT TERM GOALS: Target date: 03/31/2024   Independent with initial HEP Goal status: on  going 03/28/2024   LONG TERM GOALS: Target date: 04/21/2024   Independent with final HEP Goal status: INITIAL  2.  PSFS score improved by 2 points Goal status: INITIAL  3.  Cervical ROM improved by 10 deg all limited motions for improved function and mobility Goal status: INIITAL  4.  Report pain < 3/10 with reaching and behind head activities for improved function Goal status: INITIAL  5.  Demonstrate ability to get hands behind head and report 50% improvement in grooming tasks (doing her hair) Goal status: INITIAL    PLAN:  PT FREQUENCY: 1x/week  PT DURATION: 6 weeks  PLANNED INTERVENTIONS: 97164- PT Re-evaluation, 97750- Physical Performance Testing, 97110-Therapeutic exercises, 97530- Therapeutic activity, [id number]- Neuromuscular re-education, 97535- Self Care, [id number]- Manual therapy, [id number]- Aquatic Therapy, H9716- Electrical stimulation (unattended), [phone]- Traction (mechanical), Patient/Family education, Taping, Joint mobilization, Joint manipulation, Spinal manipulation, Spinal mobilization, Cryotherapy, and Moist heat.  PLAN FOR NEXT SESSION: review wrist HEP, continue to check for any updates about MRI.     Corean JULIANNA Ku, PT, DPT 04/01/24 11:40 AM      Referring diagnosis? M54.2 Treatment diagnosis? (if different than referring diagnosis) M54.12, R29.3, M62.81 What was this (referring dx) caused by? []  Surgery []  Fall [x]  Ongoing issue [x]  Arthritis []  Other: ____________  Laterality: []  Rt []  Lt [x]  Both  Check all possible CPT codes:  *CHOOSE 10 OR LESS*    See Planned Interventions listed in the Plan section of the Evaluation.

## 2024-04-15 ENCOUNTER — Ambulatory Visit: Admitting: Physician Assistant

## 2024-04-15 ENCOUNTER — Ambulatory Visit

## 2024-04-15 DIAGNOSIS — R293 Abnormal posture: Secondary | ICD-10-CM | POA: Diagnosis not present

## 2024-04-15 DIAGNOSIS — M6281 Muscle weakness (generalized): Secondary | ICD-10-CM | POA: Diagnosis not present

## 2024-04-15 DIAGNOSIS — M5412 Radiculopathy, cervical region: Secondary | ICD-10-CM | POA: Diagnosis not present

## 2024-04-15 NOTE — Therapy (Signed)
 OUTPATIENT PHYSICAL THERAPY TREATMENT   Patient Name: Danielle Pierce MRN: [id number] DOB:June 23, 1941, 83 y.o., female Today's Date: 04/15/2024  END OF SESSION:  PT End of Session - 04/15/24 0930     Visit Number 4    Number of Visits 6    Date for PT Re-Evaluation 04/21/24    Authorization Type [employer] $20 copay    Progress Note Due on Visit 10    PT Start Time 0933    PT Stop Time 1017    PT Time Calculation (min) 44 min    Activity Tolerance Patient tolerated treatment well    Behavior During Therapy [facility] for tasks assessed/performed             Past Medical History:  Diagnosis Date   Arthritis    Ankle   Asymptomatic varicose veins    Benign neoplasm of colon    Bilateral shoulder pain 07/02/14   Cataract    Bilateral - just watching   Cervicitis and endocervicitis 09/13/2011   Disorder of bone and cartilage, unspecified    Disorders of bursae and tendons in shoulder region, unspecified    External hemorrhoids without mention of complication    Fibrocystic breast    Generalized osteoarthrosis, unspecified site    GERD (gastroesophageal reflux disease)    diet controlled, No meds   Hiatal hernia    Hypertension    Impacted cerumen 11/07/2011   Obesity, unspecified    Other diseases of nasal cavity and sinuses(478.19)    Pain in joint, ankle and foot    Pain in joint, pelvic region and thigh    Pain in joint, shoulder region    Reflux esophagitis    SVD (spontaneous vaginal delivery)    x 2   Unspecified essential hypertension    Unspecified vitamin D  deficiency    Past Surgical History:  Procedure Laterality Date   COLONOSCOPY  2006   Dr.Orr   COLONOSCOPY  07/08/2013   Dr. Avram   FLEXIBLE SIGMOIDOSCOPY  1990   Hemorrhoids    MOUTH SURGERY  2005   DR LUTINS - tooth ext and gum surgery   SHOULDER ARTHROSCOPY W/ ACROMIAL REPAIR Right 07/02/14   Dr. Anderson   UPPER GASTROINTESTINAL ENDOSCOPY     normal   Patient Active Problem List    Diagnosis Date Noted   Radiculopathy, cervical region 03/13/2024   Trigger thumb, right thumb 07/27/2020   Body mass index (BMI) of 40.1-44.9 in adult (HCC) 11/14/2018   Localized osteoarthritis of left knee 05/10/2017   HLD (hyperlipidemia) 01/19/2016   Excessive cerumen in both ear canals 05/18/2015   Lumbago 04/08/2014   Internal and external bleeding hemorrhoids 07/08/2013   DM (diabetes mellitus), type 2 with renal complications (HCC) 02/20/2013   Essential hypertension 02/20/2013   Obesity    Vitamin D  deficiency     PCP: Caro Harlene POUR, NP  REFERRING PROVIDER: Persons, Ronal Dragon, PA  REFERRING DIAG: M54.2 (ICD-10-CM) - Neck pain  Rationale for Evaluation and Treatment: Rehabilitation  THERAPY DIAG:  Radiculopathy, cervical region  Abnormal posture  Muscle weakness (generalized)  ONSET DATE: Feb 2025   SUBJECTIVE:  SUBJECTIVE STATEMENT: My hands aren't doing so well. Complaints of n/t in B hands.   PERTINENT HISTORY:  OA, HTN, obesity, DM, CKD, Rt shoulder surgery (2015)  PAIN:   NPRS scale: neck pain up to 5/10.   Pain location: neck, radiates into UEs, feels Lt is worse Pain description: dull, discsomfort Aggravating factors: raising/moving arms, turning head to the Lt Relieving factors: avoiding provoking factors  PRECAUTIONS:  None  RED FLAGS: None   WEIGHT BEARING RESTRICTIONS:  No  FALLS:  Has patient fallen in last 6 months? 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  DIAGNOSTIC FINDINGS:  X-rays consistent with advanced degenerative changes of her cervical spine. She has almost complete loss of joint space at multiple levels with sclerotic changes  PATIENT SURVEYS:  Patient-Specific Activity Scoring Scheme  0 represents "unable to perform." 10 represents "able to perform at prior level. 0 1 2 3 4 5 6 7 8 9  10 (Date and Score)   Activity Eval   04/15/24  1. Reach hands behind head 5   5  2. Turning head 5  5  Score 5 5   Total score = sum of the activity scores/number of activities Minimum detectable change (90%CI) for average score = 2 points Minimum detectable change (90%CI) for single activity score = 3 points    COGNITIVE STATUS: 03/10/2024 Within functional limits for tasks assessed   SENSATION: 03/10/2024 C/o numbness and coldness in hands  POSTURE:  03/10/2024 rounded shoulders and forward head  HAND DOMINANCE:  03/10/2024 Right  GAIT: 03/10/24 Comments: independent   PALPATION: 03/10/24 trigger points noted in bil upper trap (worse in Lt) and infraspinatus   CERVICAL ROM:   ROM A/PROM (deg) Eval 03/10/2024 AROM 03/28/2024 AROM 04/15/24   Flexion 28 35 with no impact on hand symptoms 35 with no change in n/t  Extension 30 56 with no impact on hand symptoms  56 with no change in n/t  Right lateral flexion 20  32  Left lateral flexion 21  32  Right rotation 35 (mild discomfort) 67 with no impact on hand symptoms  50 with no change in n/t; began to rot trunk  Left rotation 40 (mild discomfort) 68 with no impact on hand symptoms  52 with no change in n/t; began to rot trunk   (Blank rows = not tested)   UPPER EXTREMITY ROM:  ROM Right Eval 03/10/2024 Left Eval 03/10/2024  Shoulder flexion 130 130  Shoulder abduction 120 120  Shoulder internal rotation To glute To glute  Shoulder  external rotation Base of skull Base of skull   (Blank rows = not tested)   UPPER EXTREMITY MMT:  MMT Right Eval 03/10/2024 Left Eval 03/10/2024  Shoulder flexion 3-/5 3-/5  Shoulder extension    Shoulder abduction  3-/5  Shoulder adduction    Shoulder extension    Shoulder internal rotation  4/5  Shoulder external rotation  3/5  Grip strength     (Blank rows = not tested)    SPECIAL TESTS:  03/28/2024:  No hand sensation changes noted with cervical  movements, cervical distraction.  Pt did report neck symptom improvement with cervical distraction.   03/10/24 Cervical Spurling's test: Negative and Distraction test: Negative                  TREATMENT 04/15/24 Neuro Re Ed Prayer stretch 3 x 30s Nerve tension gliding for median nerve 10 x 5s hold  TherEx:  Blue TB rows 2x15 (7/10 difficulty)  Green TB straight arm pull downs 2x15  TherAct:  Measuring goals and ROM   Manual IASTM using handheld tool with biofreeze to B wrists flexors with focus on trigger points (L>R)  04/01/24 Neuro Re Ed Nerve tension testing with tightness noted in median nerve, less in ulnar nerve bil Median nerve glide 10 x 5 sec hold bil Prayer stretch 3x15 sec  TherEx Wrist flexor stretch 3x15 sec bil; hand against counter Seated wrist flexion x10 with 2# weight bil  Manual IASTM with biofreeze to bil wrist flexors including compression to trigger points    03/28/2024 Therex: UBE fwd/back 3 mins each way lvl 2.0 with 1 min rest between directions.  Seated cervical AROM x 5 each way for rotation.   Verbal review of existing HEP knowledge, cues for techniques.   Neuro Re-ed (muscle activation postural awareness/activation) Supine cervical retraction isometric 5 sec hold x 10 Supine scapular retraction 5 sec hold x 10 Supine bilateral GH ext isometric 5 sec hold x 10  Standing bilateral shoulder rows c scapular retraction focus green band 2 x 15 Standing bilateral shoudler gh ext green  band 2 x 15  Manual Cervical distraction intermittent for cervical relief.  No impact on hand symptoms noted during performance.      03/10/24 TherEx See HEP - demonstrated with trial reps performed PRN, mod cues for comprehension    Self Care Educated on clinical findings and POC, as well as ways for home self mobilization including cane and tennis ball     PATIENT EDUCATION:  Education details: HEP, see above Person educated: Patient Education method: Explanation, Demonstration, and Handouts Education comprehension: verbalized understanding, returned demonstration, and needs further education  HOME EXERCISE PROGRAM: Access Code: [email or web address] URL: [email or web address] Date: 03/10/2024 Prepared by: Corean Ku  Exercises - Seated Assisted Cervical Rotation with Towel  - 1-2 x daily - 7 x weekly - 1 sets - 10 reps - 5-10 sec hold - Cervical Extension AROM with Strap  - 1-2 x daily - 7 x weekly - 1 sets - 10 reps - 5-10 sec hold - Seated Cervical Retraction  - 2 x daily - 7 x weekly - 1 sets - 10 reps - 5 sec hold - Seated Scapular Retraction  - 2 x daily - 7 x weekly - 1 sets - 10 reps - 5 sec hold - Standing Upper Trapezius Mobilization with Small Ball  - 1 x daily - 7 x weekly - 3 sets - 10 reps   Access Code: [email or web address] URL: [email or web address] Date: 04/01/2024 Prepared by: Corean Ku  Exercises - Median Nerve Tensioner  - 3 x daily - 7 x weekly - 1 sets - 5 reps - 3-5 sec hold - Wrist Prayer Stretch  - 3 x daily - 7 x weekly - 1 sets - 3 reps - 15 sec hold   ASSESSMENT:  CLINICAL IMPRESSION: Patient arrived with no c/o pain, but continued n/t in B hands. Patient continues to have mobility deficits in the cervical ROM and deficits in functional goals. PT recommended follow up with provider to go  over xray results in depth and for continued n/t. Patient provided with green TB to increase intensity of scapular retraction  exercise in HEP. Patient will continue to benefit from skilled physical therapy.  OBJECTIVE IMPAIRMENTS: decreased ROM, decreased strength, hypomobility, increased fascial restrictions, increased muscle spasms, impaired UE functional use, postural dysfunction, and pain.   ACTIVITY LIMITATIONS: lifting, bathing, dressing, reach over head, and hygiene/grooming  PARTICIPATION LIMITATIONS: meal prep, cleaning, laundry, driving, shopping, community activity, and occupation  PERSONAL FACTORS: Time since onset of injury/illness/exacerbation and 3+ comorbidities: OA, HTN, obesity, DM, CKD are also affecting patient's functional outcome.   REHAB POTENTIAL: Good  CLINICAL DECISION MAKING: Evolving/moderate complexity  EVALUATION COMPLEXITY: Moderate   GOALS: Goals reviewed with patient? Yes  SHORT TERM GOALS: Target date: 03/31/2024   Independent with initial HEP Goal status: on going 03/28/2024   LONG TERM GOALS: Target date: 04/21/2024   Independent with final HEP Goal status: PROGRESSING as of 04/15/24  2.  PSFS score improved by 2 points Goal status: ONGOING as of 04/15/24  3.  Cervical ROM improved by 10 deg all limited motions for improved function and mobility Goal status: ONGOING as of 04/15/24  4.  Report pain < 3/10 with reaching and behind head activities for improved function Goal status: PROGRESSING; no pain, but continued n/t   5.  Demonstrate ability to get hands behind head and report 50% improvement in grooming tasks (doing her hair) Goal status: ONGOING as of 04/15/24; can reach mastoid processes on B sides, continues to receive help from daughter     PLAN:  PT FREQUENCY: 1x/week  PT DURATION: 6 weeks  PLANNED INTERVENTIONS: 97164- PT Re-evaluation, 97750- Physical Performance Testing, 97110-Therapeutic exercises, 97530- Therapeutic activity, 97112- Neuromuscular re-education, 97535- Self Care, 02859- Manual therapy, [id number]- Aquatic Therapy, H9716- Electrical  stimulation (unattended), [phone]- Traction (mechanical), Patient/Family education, Taping, Joint mobilization, Joint manipulation, Spinal manipulation, Spinal mobilization, Cryotherapy, and Moist heat.  PLAN FOR NEXT SESSION: cervical and wrist mobility, manual to assist with n/t     Susannah Daring, PT, DPT 04/15/24 10:37 AM      Referring diagnosis? M54.2 Treatment diagnosis? (if different than referring diagnosis) M54.12, R29.3, M62.81 What was this (referring dx) caused by? []  Surgery []  Fall [x]  Ongoing issue [x]  Arthritis []  Other: ____________  Laterality: []  Rt []  Lt [x]  Both  Check all possible CPT codes:  *CHOOSE 10 OR LESS*    See Planned Interventions listed in the Plan section of the Evaluation.

## 2024-04-21 ENCOUNTER — Ambulatory Visit (INDEPENDENT_AMBULATORY_CARE_PROVIDER_SITE_OTHER): Admitting: Nurse Practitioner

## 2024-04-21 ENCOUNTER — Ambulatory Visit: Payer: Self-pay

## 2024-04-21 ENCOUNTER — Encounter: Payer: Self-pay | Admitting: Nurse Practitioner

## 2024-04-21 VITALS — BP 136/82 | HR 91 | Temp 97.4°F | Resp 15 | Ht 64.0 in | Wt 246.6 lb

## 2024-04-21 DIAGNOSIS — E1122 Type 2 diabetes mellitus with diabetic chronic kidney disease: Secondary | ICD-10-CM

## 2024-04-21 DIAGNOSIS — M4802 Spinal stenosis, cervical region: Secondary | ICD-10-CM | POA: Diagnosis not present

## 2024-04-21 DIAGNOSIS — N1831 Chronic kidney disease, stage 3a: Secondary | ICD-10-CM | POA: Diagnosis not present

## 2024-04-21 DIAGNOSIS — E785 Hyperlipidemia, unspecified: Secondary | ICD-10-CM | POA: Diagnosis not present

## 2024-04-21 LAB — CBC WITH DIFFERENTIAL/PLATELET
Absolute Lymphocytes: 2441 {cells}/uL (ref 850–3900)
Absolute Monocytes: 601 {cells}/uL (ref 200–950)
Basophils Absolute: 23 {cells}/uL (ref 0–200)
Basophils Relative: 0.3 %
Eosinophils Absolute: 162 {cells}/uL (ref 15–500)
Eosinophils Relative: 2.1 %
HCT: 40.8 % (ref 35.0–45.0)
Hemoglobin: 13.5 g/dL (ref 11.7–15.5)
MCH: 31.4 pg (ref 27.0–33.0)
MCHC: 33.1 g/dL (ref 32.0–36.0)
MCV: 94.9 fL (ref 80.0–100.0)
MPV: 10.2 fL (ref 7.5–12.5)
Monocytes Relative: 7.8 %
Neutro Abs: 4474 {cells}/uL (ref 1500–7800)
Neutrophils Relative %: 58.1 %
Platelets: 192 Thousand/uL (ref 140–400)
RBC: 4.3 Million/uL (ref 3.80–5.10)
RDW: 14.4 % (ref 11.0–15.0)
Total Lymphocyte: 31.7 %
WBC: 7.7 Thousand/uL (ref 3.8–10.8)

## 2024-04-21 LAB — COMPREHENSIVE METABOLIC PANEL WITH GFR
AG Ratio: 2 (calc) (ref 1.0–2.5)
ALT: 17 U/L (ref 6–29)
AST: 25 U/L (ref 10–35)
Albumin: 4.3 g/dL (ref 3.6–5.1)
Alkaline phosphatase (APISO): 101 U/L (ref 37–153)
BUN/Creatinine Ratio: 13 (calc) (ref 6–22)
BUN: 13 mg/dL (ref 7–25)
CO2: 28 mmol/L (ref 20–32)
Calcium: 9.5 mg/dL (ref 8.6–10.4)
Chloride: 105 mmol/L (ref 98–110)
Creat: 1.04 mg/dL — ABNORMAL HIGH (ref 0.60–0.95)
Globulin: 2.2 g/dL (ref 1.9–3.7)
Glucose, Bld: 100 mg/dL — ABNORMAL HIGH (ref 65–99)
Potassium: 4.1 mmol/L (ref 3.5–5.3)
Sodium: 141 mmol/L (ref 135–146)
Total Bilirubin: 0.7 mg/dL (ref 0.2–1.2)
Total Protein: 6.5 g/dL (ref 6.1–8.1)
eGFR: 54 mL/min/1.73m2 — ABNORMAL LOW (ref >=60)

## 2024-04-21 LAB — LIPID PANEL
Cholesterol: 159 mg/dL (ref <200)
HDL: 59 mg/dL (ref >=50)
LDL Cholesterol (Calc): 83 mg/dL
Non-HDL Cholesterol (Calc): 100 mg/dL (ref <130)
Total CHOL/HDL Ratio: 2.7 (calc) (ref <5.0)
Triglycerides: 79 mg/dL (ref <150)

## 2024-04-21 LAB — HEMOGLOBIN A1C
Hgb A1c MFr Bld: 6.1 % — ABNORMAL HIGH (ref <5.7)
Mean Plasma Glucose: 128 mg/dL
eAG (mmol/L): 7.1 mmol/L

## 2024-04-21 NOTE — Progress Notes (Signed)
 Careteam: Patient Care Team: Caro Harlene POUR, NP as PCP - General (Geriatric Medicine) Anderson Maude ORN, MD (Inactive) as Consulting Physician (Orthopedic Surgery) Ivin Kocher, MD as Consulting Physician (Dermatology) Avram Lupita BRAVO, MD as Consulting Physician (Gastroenterology) Waylan Cain, MD as Consulting Physician (Ophthalmology)  PLACE OF SERVICE:  [facility]  Advanced Directive information    No Known Allergies  Chief Complaint  Patient presents with   Acute Visit    Patient stated on July 6th Round up weed control was empty and patient thought it was water and it wasn't patient stated about the day after she's been losing feeling in her hands. Pt will be going to ortho on the 22nd .     HPI:  Discussed the use of AI scribe software for clinical note transcription with the patient, who gave verbal consent to proceed.  History of Present Illness Danielle Pierce is an 83 year old female who presents with numbness and cold sensation in her hands.  She has been experiencing numbness and a cold sensation in her hands for the past three weeks, describing it as 'no feeling, ice cold.' The symptoms began after she accidentally came into contact with Roundup, a herbicide, while cleaning up a spill in her truck. She did not wash her hands immediately, thinking it was water, and only realized it was Roundup after returning home a few hours later.  She is under the care of an orthopedic specialist for arthritis in her neck, which was identified as potentially contributing to her symptoms. An MRI was performed, but she has not yet received the results. She has been told she has arthritis in her neck and is awaiting MRI results, which are to be reviewed by her orthopedic specialist which has been scheduled for later in the week.  She experiences weakness in her neck, shoulders, and hands, and has difficulty laying on either side due to shoulder pain. She has had surgery  on one shoulder and has arthritis in the other. She reports radiating pain down her arms and occasional coldness in her hands, which she had mentioned in a previous visit.  No neck pain except when turning her head and she does not frequently drop things from her hands, although she notes increased numbness recently.   Review of Systems:  Review of Systems  Constitutional:  Negative for chills, fever and weight loss.  HENT:  Negative for tinnitus.   Respiratory:  Negative for cough, sputum production and shortness of breath.   Cardiovascular:  Negative for chest pain, palpitations and leg swelling.  Gastrointestinal:  Negative for abdominal pain, diarrhea and heartburn.  Musculoskeletal:  Positive for myalgias and neck pain. Negative for back pain, falls and joint pain.  Skin: Negative.   Neurological:  Positive for tingling, sensory change and weakness. Negative for dizziness and headaches.    Past Medical History:  Diagnosis Date   Arthritis    Ankle   Asymptomatic varicose veins    Benign neoplasm of colon    Bilateral shoulder pain 07/02/14   Cataract    Bilateral - just watching   Cervicitis and endocervicitis 09/13/2011   Disorder of bone and cartilage, unspecified    Disorders of bursae and tendons in shoulder region, unspecified    External hemorrhoids without mention of complication    Fibrocystic breast    Generalized osteoarthrosis, unspecified site    GERD (gastroesophageal reflux disease)    diet controlled, No meds   Hiatal hernia  Hypertension    Impacted cerumen 11/07/2011   Obesity, unspecified    Other diseases of nasal cavity and sinuses(478.19)    Pain in joint, ankle and foot    Pain in joint, pelvic region and thigh    Pain in joint, shoulder region    Reflux esophagitis    SVD (spontaneous vaginal delivery)    x 2   Unspecified essential hypertension    Unspecified vitamin D  deficiency    Past Surgical History:  Procedure Laterality Date    COLONOSCOPY  2006   Dr.Orr   COLONOSCOPY  07/08/2013   Dr. Avram   FLEXIBLE SIGMOIDOSCOPY  1990   Hemorrhoids    MOUTH SURGERY  2005   DR LUTINS - tooth ext and gum surgery   SHOULDER ARTHROSCOPY W/ ACROMIAL REPAIR Right 07/02/14   Dr. Anderson   UPPER GASTROINTESTINAL ENDOSCOPY     normal   Social History:   reports that she has never smoked. She has never used smokeless tobacco. She reports current alcohol use. She reports that she does not use drugs.  Family History  Problem Relation Age of Onset   Hypertension Mother    Kidney disease Mother        Renal failure   Cancer Brother        Lymphoma   ADD / ADHD Son    Seizures Son    Hypertension Sister    Dementia Sister    Hypertension Sister    Hypertension Sister    Cancer - Other Sister    Early death Brother        Some type of accident   Colon cancer Father 55   Stomach cancer Neg Hx    Rectal cancer Neg Hx     Medications: Patient's Medications  New Prescriptions   No medications on file  Previous Medications   ALBUTEROL  (VENTOLIN  HFA) 108 (90 BASE) MCG/ACT INHALER    Inhale 2 puffs into the lungs every 6 (six) hours as needed for wheezing or shortness of breath.   ASPIRIN 81 MG TABLET    Take 81 mg by mouth daily.   ATORVASTATIN  (LIPITOR) 20 MG TABLET    Take 1 tablet (20 mg total) by mouth at bedtime.   CALCIUM  CARBONATE-VITAMIN D  (CALCIUM -VITAMIN D ) 500-200 MG-UNIT PER TABLET    Take 1 tablet by mouth daily.   CARBOXYMETHYLCELL-HYPROMELLOSE 0.25-0.3 % GEL    Apply 1 application to eye daily. to alleviate irritation.   CHLORHEXIDINE (PERIDEX) 0.12 % SOLUTION    15 mLs.   CHOLECALCIFEROL (VITAMIN D -3 PO)    Take 1 tablet by mouth daily.   GUAIFENESIN -DEXTROMETHORPHAN (ROBITUSSIN DM) 100-10 MG/5ML SYRUP    Take 10 mLs by mouth every 6 (six) hours as needed for cough.   HYDROCORTISONE  (ANUSOL -HC) 25 MG SUPPOSITORY    Place 1 suppository (25 mg total) rectally daily as needed for hemorrhoids or anal itching.    LOSARTAN -HYDROCHLOROTHIAZIDE  (HYZAAR) 50-12.5 MG TABLET    Take 1 tablet by mouth daily.   PROTEIN POWD    Take 1 Scoop by mouth daily.   SEMAGLUTIDE ,0.25 OR 0.5MG /DOS, (OZEMPIC , 0.25 OR 0.5 MG/DOSE,) 2 MG/3ML SOPN    Inject 0.25 mg into the skin once a week.   SIMETHICONE  (MYLICON) 80 MG CHEWABLE TABLET    Chew 80 mg by mouth as needed for flatulence.   TURMERIC PO    Take 1 tablet by mouth as needed. Also taking turmeric liquid supplement   VITAMIN B-12 (CYANOCOBALAMIN) 500 MCG TABLET  Take 500 mcg by mouth daily.   VITAMIN C (ASCORBIC ACID) 500 MG TABLET    Take 500 mg by mouth daily.   VITAMIN E 400 UNIT CAPSULE    Take 400 Units by mouth daily.  Modified Medications   No medications on file  Discontinued Medications   No medications on file    Physical Exam:  Vitals:   04/21/24 1333 04/21/24 1339  BP: (!) 146/80 136/82  Pulse: 91   Resp: 15   Temp: (!) 97.4 F (36.3 C)   SpO2: 96%   Weight: 246 lb 9.6 oz (111.9 kg)   Height: 5' 4 (1.626 m)    Body mass index is 42.33 kg/m. Wt Readings from Last 3 Encounters:  04/21/24 246 lb 9.6 oz (111.9 kg)  02/11/24 250 lb (113.4 kg)  01/31/24 250 lb 9.6 oz (113.7 kg)    Physical Exam Constitutional:      Appearance: Normal appearance.  Pulmonary:     Effort: Pulmonary effort is normal.  Musculoskeletal:        General: No deformity or signs of injury.     Right hand: Normal range of motion. Normal sensation.     Left hand: Normal range of motion. Normal sensation.  Neurological:     Mental Status: She is alert. Mental status is at baseline.  Psychiatric:        Mood and Affect: Mood normal.     Labs reviewed: Basic Metabolic Panel: Recent Labs    04/23/23 0947 10/29/23 0928 01/31/24 1456  NA 141 142 144  K 4.2 4.2 4.2  CL 109 105 108  CO2 25 27 30   GLUCOSE 105* 102* 79  BUN 10 13 12   CREATININE 1.07* 1.08* 1.05*  CALCIUM  9.3 9.5 9.5   Liver Function Tests: Recent Labs    04/23/23 0947 10/29/23 0928  01/31/24 1456  AST 24 22 23   ALT 16 15 14   BILITOT 0.4 0.7 0.7  PROT 5.8* 6.7 6.3   No results for input(s): LIPASE, AMYLASE in the last 8760 hours. No results for input(s): AMMONIA in the last 8760 hours. CBC: Recent Labs    04/23/23 0947 10/29/23 0928 01/31/24 1456  WBC 6.4 7.5 9.0  NEUTROABS 3,405 3,600 5,031  HGB 12.3 13.3 12.9  HCT 37.0 40.9 39.6  MCV 92.5 93.8 93.0  PLT 206 188 208   Lipid Panel: Recent Labs    04/23/23 0947  CHOL 141  HDL 55  LDLCALC 74  TRIG 50  CHOLHDL 2.6   TSH: No results for input(s): TSH in the last 8760 hours. A1C: Lab Results  Component Value Date   HGBA1C 6.3 (H) 10/29/2023   MR Cervical Spine w/o contrast Result Date: 04/03/2024 CLINICAL DATA:  Weakness in the neck, shoulders and hands for approximately 4 weeks. No known injury. EXAM: MRI CERVICAL SPINE WITHOUT CONTRAST TECHNIQUE: Multiplanar, multisequence MR imaging of the cervical spine was performed. No intravenous contrast was administered. COMPARISON:  Plain film cervical spine 02/08/2024. FINDINGS: Alignment: There is straightening of the normal cervical lordosis and trace anterolisthesis C7 on T1. Vertebrae: No fracture, evidence of discitis, or bone lesion. Cord: Mild, hazy edema is seen within the cord at the C3-4 level. Posterior Fossa, vertebral arteries, paraspinal tissues: Negative. Disc levels: C2-3: Small central disc protrusion and mild to moderate facet degenerative change. The central canal and foramina remain open. C3-4: Disc osteophyte complex with a superimposed central disc protrusion which appears to be calcified. The cord is severely flattened. There  is moderately severe to severe bilateral foraminal narrowing, worse on the left. Facet arthropathy is also worse on the left with mild marrow edema seen in the left facets. C4-5: Disc osteophyte complex and uncovertebral spurring. The ventral cord is flattened. Moderately severe to severe foraminal narrowing is  worse on the left. C5-6: Disc osteophyte complex flattens the ventral cord. Uncovertebral and facet arthropathy cause moderately severe to severe foraminal narrowing, worse on the right. C6-7: Shallow disc bulge and uncovertebral spurring. The central canal is open. Mild bilateral foraminal narrowing is present. C7-T1: The disc is uncovered with a superimposed central and right paracentral protrusion. The ventral thecal sac is nearly effaced. Mild right foraminal narrowing is seen. The left foramen is open. Bilateral facet arthropathy is present with secondary mild marrow edema in the right facets. IMPRESSION: 1. Severe central canal stenosis at C3-4 where a central disc protrusion appears to be calcified. The cord is severely flattened and there is mild edema in the cord at this level. 2. Flattening of the ventral cord at C4-5 and C5-6. 3. Moderately severe to severe foraminal narrowing bilaterally at C3-4, C4-5 and C5-6. 4. Central and right paracentral protrusion at C7-T1 nearly effaces the ventral thecal sac. 5. Facet arthropathy at C3-4 and C7-T1 with secondary mild marrow edema in the facets. Electronically Signed   By: Debby Prader M.D.   On: 04/03/2024 10:41      Assessment/Plan Assessment and Plan Assessment & Plan Cervical Spinal Stenosis Severe stenosis at C3-C4, moderately severe at C4-C5 and C5-C6 with central disc protrusion and mild cord edema. Symptoms likely due to cervical stenosis affecting nerve roots to hands. - Follow up with orthopedic specialist to discuss MRI results and treatment options - Consider neurosurgery referral if recommended by orthopedic specialist.  2. Type 2 diabetes mellitus with stage 3a chronic kidney disease, without long-term current use of insulin (HCC) - Urine Albumin/Creatinine with ratio (send out) [LAB689] - Hemoglobin A1c - CBC with Differential/Platelet - Comprehensive metabolic panel with GFR  3. Hyperlipidemia, unspecified hyperlipidemia  type - Lipid panel    To keep follow up as scheduled.  Amyri Frenz K. Caro BODILY [facility] [phone]

## 2024-04-21 NOTE — Telephone Encounter (Signed)
 FYI Only or Action Required?: FYI only for provider.  Patient was last seen in primary care on 02/11/2024 by Caro Harlene POUR, NP.  Called Nurse Triage reporting Numbness.  Symptoms began today.  Interventions attempted: Nothing.  Symptoms are: unchanged.  Triage Disposition: See HCP Within 4 Hours (Or PCP Triage)  Patient/caregiver understands and will follow disposition?: YesCopied from CRM [id number]. Topic: Clinical - Red Word Triage >> Apr 21, 2024  8:46 AM Merlynn A wrote: Red Word that prompted transfer to Nurse Triage: Numbness in hands Reason for Disposition  [1] Weakness of the face, arm / hand, or leg / foot on one side of the body AND [2] gradual onset (e.g., days to weeks) AND [3] present now  Answer Assessment - Initial Assessment Questions 1. SYMPTOM: What is the main symptom you are concerned about? (e.g., weakness, numbness)     numbness 2. ONSET: When did this start? (e.g., minutes, hours, days; while sleeping)     3 weeks  3. LAST NORMAL: When was the last time you (the patient) were normal (no symptoms)?     3 weeks 4. PATTERN Does this come and go, or has it been constant since it started?  Is it present now?     Constant  5. CARDIAC SYMPTOMS: Have you had any of the following symptoms: chest pain, difficulty breathing, palpitations?     denies 6. NEUROLOGIC SYMPTOMS: Have you had any of the following symptoms: headache, dizziness, vision loss, double vision, changes in speech, unsteady on your feet?     Na  7. OTHER SYMPTOMS: Do you have any other symptoms?     Maybe some swelling    Pt stated she spilled Roundup in car 3 weeks ago and didn't realized it. Pt put her hands in it thinking it was water. Pt thinks roundup was on hands for 3 hours. Pt stated the numbness started sometime after that. Pt has also been seeing ortho for arthritis and is waiting for MRI results. Pt not sure if related/cause. Pt stated it's hard to move fingers at time.  It's hard to put my earrings on. Pt denies all other symptoms.  Protocols used: Neurologic Deficit-A-AH

## 2024-04-21 NOTE — Telephone Encounter (Signed)
 Appointment scheduled for 04/21/2024

## 2024-04-22 ENCOUNTER — Encounter: Payer: Self-pay | Admitting: Pharmacist

## 2024-04-22 ENCOUNTER — Ambulatory Visit: Admitting: Physical Therapy

## 2024-04-22 ENCOUNTER — Encounter: Payer: Self-pay | Admitting: Physical Therapy

## 2024-04-22 ENCOUNTER — Ambulatory Visit: Payer: Self-pay | Admitting: Nurse Practitioner

## 2024-04-22 DIAGNOSIS — E1122 Type 2 diabetes mellitus with diabetic chronic kidney disease: Secondary | ICD-10-CM

## 2024-04-22 DIAGNOSIS — M6281 Muscle weakness (generalized): Secondary | ICD-10-CM | POA: Diagnosis not present

## 2024-04-22 DIAGNOSIS — M5412 Radiculopathy, cervical region: Secondary | ICD-10-CM | POA: Diagnosis not present

## 2024-04-22 DIAGNOSIS — I10 Essential (primary) hypertension: Secondary | ICD-10-CM

## 2024-04-22 DIAGNOSIS — R293 Abnormal posture: Secondary | ICD-10-CM

## 2024-04-22 LAB — MICROALBUMIN / CREATININE URINE RATIO
Creatinine, Urine: 92 mg/dL (ref 20–275)
Microalb Creat Ratio: 99 mg/g{creat} — ABNORMAL HIGH (ref <30)
Microalb, Ur: 9.1 mg/dL

## 2024-04-22 MED ORDER — LOSARTAN POTASSIUM 100 MG PO TABS: 100.0000 mg | ORAL_TABLET | Freq: Every day | ORAL | 1 refills | Status: DC

## 2024-04-22 NOTE — Therapy (Addendum)
 OUTPATIENT PHYSICAL THERAPY TREATMENT RECERTIFICATION / DISCHARGE   Patient Name: Danielle Pierce MRN: [id number] DOB:03-23-1941, 83 y.o., female Today's Date: 04/22/2024  END OF SESSION:  PT End of Session - 04/22/24 0934     Visit Number 5    Number of Visits 9    Date for PT Re-Evaluation 05/20/24    Authorization Type [employer] $20 copay    Progress Note Due on Visit 10    PT Start Time 0932    PT Stop Time 0950    PT Time Calculation (min) 18 min    Activity Tolerance Patient tolerated treatment well    Behavior During Therapy [facility] for tasks assessed/performed              Past Medical History:  Diagnosis Date   Arthritis    Ankle   Asymptomatic varicose veins    Benign neoplasm of colon    Bilateral shoulder pain 07/02/14   Cataract    Bilateral - just watching   Cervicitis and endocervicitis 09/13/2011   Disorder of bone and cartilage, unspecified    Disorders of bursae and tendons in shoulder region, unspecified    External hemorrhoids without mention of complication    Fibrocystic breast    Generalized osteoarthrosis, unspecified site    GERD (gastroesophageal reflux disease)    diet controlled, No meds   Hiatal hernia    Hypertension    Impacted cerumen 11/07/2011   Obesity, unspecified    Other diseases of nasal cavity and sinuses(478.19)    Pain in joint, ankle and foot    Pain in joint, pelvic region and thigh    Pain in joint, shoulder region    Reflux esophagitis    SVD (spontaneous vaginal delivery)    x 2   Unspecified essential hypertension    Unspecified vitamin D  deficiency    Past Surgical History:  Procedure Laterality Date   COLONOSCOPY  2006   Dr.Orr   COLONOSCOPY  07/08/2013   Dr. Avram   FLEXIBLE SIGMOIDOSCOPY  1990   Hemorrhoids    MOUTH SURGERY  2005   DR LUTINS - tooth ext and gum surgery   SHOULDER ARTHROSCOPY W/ ACROMIAL REPAIR Right 07/02/14   Dr. Anderson   UPPER GASTROINTESTINAL ENDOSCOPY     normal    Patient Active Problem List   Diagnosis Date Noted   Radiculopathy, cervical region 03/13/2024   Trigger thumb, right thumb 07/27/2020   Body mass index (BMI) of 40.1-44.9 in adult (HCC) 11/14/2018   Localized osteoarthritis of left knee 05/10/2017   HLD (hyperlipidemia) 01/19/2016   Excessive cerumen in both ear canals 05/18/2015   Lumbago 04/08/2014   Internal and external bleeding hemorrhoids 07/08/2013   DM (diabetes mellitus), type 2 with renal complications (HCC) 02/20/2013   Essential hypertension 02/20/2013   Obesity    Vitamin D  deficiency     PCP: Caro Harlene POUR, NP  REFERRING PROVIDER: Persons, Ronal Dragon, PA  REFERRING DIAG: M54.2 (ICD-10-CM) - Neck pain  Rationale for Evaluation and Treatment: Rehabilitation  THERAPY DIAG:  Radiculopathy, cervical region - Plan: PT plan of care cert/re-cert  Abnormal posture - Plan: PT plan of care cert/re-cert  Muscle weakness (generalized) - Plan: PT plan of care cert/re-cert  ONSET DATE: Feb 2025   SUBJECTIVE:  SUBJECTIVE STATEMENT: No changes in symptoms  PERTINENT HISTORY:  OA, HTN, obesity, DM, CKD, Rt shoulder surgery (2015)  PAIN:   NPRS scale: neck pain up to 5/10.   Pain location: neck, radiates into UEs, feels Lt is worse Pain description: dull, discsomfort Aggravating factors: raising/moving arms, turning head to the Lt Relieving factors: avoiding provoking factors  PRECAUTIONS:  None  RED FLAGS: None   WEIGHT BEARING RESTRICTIONS:  No  FALLS:  Has patient fallen in last 6 months? 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  DIAGNOSTIC FINDINGS:  X-rays consistent with advanced degenerative changes of her cervical spine. She has almost complete loss of joint space at multiple levels with sclerotic changes  PATIENT SURVEYS:  Patient-Specific Activity Scoring Scheme  0 represents "unable to perform." 10 represents "able to perform at prior level. 0 1 2 3 4 5 6 7 8 9  10 (Date and Score)   Activity Eval   04/15/24 04/22/24  1. Reach hands behind head 5   5 5   2. Turning head 5  5 6   Score 5 5 5.5   Total score = sum of the activity scores/number of activities Minimum detectable change (90%CI) for average score = 2 points Minimum detectable change (90%CI) for single activity score = 3 points    COGNITIVE STATUS: 03/10/2024 Within functional limits for tasks assessed   SENSATION: 03/10/2024 C/o numbness and coldness in hands  POSTURE:  03/10/2024 rounded shoulders and forward head  HAND DOMINANCE:  03/10/2024 Right  GAIT: 03/10/24 Comments: independent   PALPATION: 03/10/24 trigger points noted in bil upper trap (worse in Lt) and infraspinatus   CERVICAL ROM:   ROM A/PROM (deg) Eval 03/10/2024 AROM 03/28/2024 AROM 04/15/24  AROM 04/22/24  Flexion 28 35 with no impact on hand symptoms 35 with no change in n/t 37  Extension 30 56 with no impact on hand symptoms  56 with no change in n/t   Right lateral flexion 20  32   Left lateral flexion 21  32   Right rotation 35 (mild discomfort) 67 with no impact on hand symptoms  50 with no change in n/t; began to rot trunk 61  Left rotation 40 (mild discomfort) 68 with no impact on hand symptoms  52 with no change in n/t; began to rot trunk 50   (Blank rows = not tested)   UPPER EXTREMITY ROM:  ROM  Right Eval 03/10/2024 Left Eval 03/10/2024  Shoulder flexion 130 130  Shoulder abduction 120 120  Shoulder internal rotation To glute To glute  Shoulder external rotation Base of skull Base of skull   (Blank rows = not tested)   UPPER EXTREMITY MMT:  MMT Right Eval 03/10/2024 Left Eval 03/10/2024  Shoulder flexion 3-/5 3-/5  Shoulder extension    Shoulder abduction  3-/5  Shoulder adduction    Shoulder extension    Shoulder internal rotation  4/5  Shoulder external rotation  3/5  Grip strength     (Blank rows = not tested)    SPECIAL TESTS:  03/28/2024:  No hand sensation changes  noted with cervical movements, cervical distraction.  Pt did report neck symptom improvement with cervical distraction.   03/10/24 Cervical Spurling's test: Negative and Distraction test: Negative                  TREATMENT 04/22/24 Goal assessment and discussion of current progress as well as PT POC.  Pt in agreement to hold PT at this time and follow up with Dr. Georgina about MRI results.     04/15/24 Neuro Re Ed Prayer stretch 3 x 30s Nerve tension gliding for median nerve 10 x 5s hold  TherEx:  Blue TB rows 2x15 (7/10 difficulty)  Green TB straight arm pull downs 2x15  TherAct:  Measuring goals and ROM   Manual IASTM using handheld tool with biofreeze to B wrists flexors with focus on trigger points (L>R)  04/01/24 Neuro Re Ed Nerve tension testing with tightness noted in median nerve, less in ulnar nerve bil Median nerve glide 10 x 5 sec hold bil Prayer stretch 3x15 sec  TherEx Wrist flexor stretch 3x15 sec bil; hand against counter Seated wrist flexion x10 with 2# weight bil  Manual IASTM with biofreeze to bil wrist flexors including compression to trigger points    03/28/2024 Therex: UBE fwd/back 3 mins each way lvl 2.0 with 1 min rest between directions.  Seated cervical AROM x 5 each way for rotation.   Verbal review of existing HEP knowledge, cues for techniques.    Neuro Re-ed (muscle activation postural awareness/activation) Supine cervical retraction isometric 5 sec hold x 10 Supine scapular retraction 5 sec hold x 10 Supine bilateral GH ext isometric 5 sec hold x 10  Standing bilateral shoulder rows c scapular retraction focus green band 2 x 15 Standing bilateral shoudler gh ext green band 2 x 15  Manual Cervical distraction intermittent for cervical relief.  No impact on hand symptoms noted during performance.      03/10/24 TherEx See HEP - demonstrated with trial reps performed PRN, mod cues for comprehension    Self Care Educated on clinical findings and POC, as well as ways for home self mobilization including cane and tennis ball     PATIENT EDUCATION:  Education details: HEP, see above Person educated: Patient Education method: Explanation, Demonstration, and Handouts Education comprehension: verbalized understanding, returned demonstration, and needs further education  HOME EXERCISE PROGRAM: Access Code: [email or web address] URL: [email or web address] Date: 03/10/2024 Prepared by: Corean Ku  Exercises - Seated Assisted Cervical Rotation with Towel  - 1-2 x daily - 7 x weekly - 1 sets - 10 reps - 5-10 sec hold - Cervical Extension AROM with Strap  - 1-2 x daily - 7 x weekly - 1 sets - 10 reps - 5-10 sec hold - Seated Cervical Retraction  - 2 x daily - 7 x weekly - 1 sets - 10 reps - 5 sec hold - Seated Scapular Retraction  - 2 x daily - 7 x weekly - 1 sets - 10 reps - 5 sec hold - Standing Upper Trapezius Mobilization with Small Ball  - 1 x daily - 7 x weekly - 3 sets - 10 reps   Access Code: [email or web address] URL: [email or web address] Date: 04/01/2024 Prepared by: Corean Ku  Exercises - Median Nerve Tensioner  - 3 x daily - 7 x weekly - 1 sets - 5 reps - 3-5 sec hold - Wrist Prayer Stretch  - 3 x daily - 7 x weekly - 1 sets - 3 reps - 15 sec hold  ASSESSMENT:  CLINICAL  IMPRESSION: Pt has met 1 goal at this time with no change in numbness or other symptoms.  At this time plan to hold PT until after she sees Dr. Georgina.  Will hold PT.   OBJECTIVE IMPAIRMENTS: decreased ROM, decreased strength, hypomobility, increased fascial restrictions, increased muscle spasms, impaired UE functional use, postural dysfunction, and pain.   ACTIVITY LIMITATIONS: lifting, bathing, dressing, reach over head, and hygiene/grooming  PARTICIPATION LIMITATIONS: meal prep, cleaning, laundry, driving, shopping, community activity, and occupation  PERSONAL FACTORS: Time since onset of injury/illness/exacerbation and 3+ comorbidities: OA, HTN, obesity, DM, CKD are also affecting patient's functional outcome.   REHAB POTENTIAL: Good  CLINICAL DECISION MAKING: Evolving/moderate complexity  EVALUATION COMPLEXITY: Moderate   GOALS: Goals reviewed with patient? Yes  SHORT TERM GOALS: Target date: 03/31/2024   Independent with initial HEP Goal status: on going 03/28/2024   LONG TERM GOALS: Target date: 05/20/2024   Independent with final HEP Goal status: ONGOING 04/22/24  2.  PSFS score improved by 2 points Goal status: ONGOING 04/22/24  3.  Cervical ROM improved by 10 deg all limited motions for improved function and mobility Goal status: MET 04/22/24  4.  Report pain < 3/10 with reaching and behind head activities for improved function Goal status: PARTIALLY MET 04/22/24 (c/o soreness)  5.  Demonstrate ability to get hands behind head and report 50% improvement in grooming tasks (doing her hair) Goal status: ONGOING 04/22/24 (hasn't tried)    PLAN:  PT FREQUENCY: 1x/week  PT DURATION: 6 weeks  PLANNED INTERVENTIONS: 97164- PT Re-evaluation, 97750- Physical Performance Testing, 97110-Therapeutic exercises, 97530- Therapeutic activity, [id number]- Neuromuscular re-education, 97535- Self Care, 02859- Manual therapy, [id number]- Aquatic Therapy, H9716- Electrical stimulation  (unattended), [phone]- Traction (mechanical), Patient/Family education, Taping, Joint mobilization, Joint manipulation, Spinal manipulation, Spinal mobilization, Cryotherapy, and Moist heat.  PLAN FOR NEXT SESSION: hold PT until after she sees MD    Corean JULIANNA Ku, PT, DPT 04/22/24 9:54 AM      Referring diagnosis? M54.2 Treatment diagnosis? (if different than referring diagnosis) M54.12, R29.3, M62.81 What was this (referring dx) caused by? []  Surgery []  Fall [x]  Ongoing issue [x]  Arthritis []  Other: ____________  Laterality: []  Rt []  Lt [x]  Both  Check all possible CPT codes:  *CHOOSE 10 OR LESS*    See Planned Interventions listed in the Plan section of the Evaluation.     PHYSICAL THERAPY DISCHARGE SUMMARY  Visits from Start of Care: 5  Current functional level related to goals / functional outcomes: See note   Remaining deficits: See note   Education / Equipment: HEP   Patient agrees to discharge. Patient goals were partially met. Patient is being discharged due to not returning since the last visit.

## 2024-04-22 NOTE — Progress Notes (Signed)
   04/22/2024  Patient ID: Antoine LOISE Kleine, female   DOB: 13-Jan-1941, 83 y.o.   MRN: [id number]  Pharmacy Quality Measure Review  This patient is appearing on a report for being at risk of failing the adherence measure for diabetes medications this calendar year.   Medication: Ozempic  Last fill date: 04/07/24 for 28 day supply  Insurance report was not up to date. No action needed at this time.  Patient had Ozempic  filled piror to this on 02/21/24  HgA1c-6.1% On statin -Atorvastatin  20 mg --LDL 83 mg/dl last filled 94/77/74 for a 90 day supply  On Losartan /Hydrochlorothiazide  las gillrf 02/21/24 for a 90 day supply ( This was changed to plain Losartan  on 04/22/24)  Cassius DOROTHA Brought, PharmD, BCACP Clinical Pharmacist [phone]

## 2024-04-24 ENCOUNTER — Ambulatory Visit: Admitting: Orthopedic Surgery

## 2024-04-24 ENCOUNTER — Other Ambulatory Visit (INDEPENDENT_AMBULATORY_CARE_PROVIDER_SITE_OTHER): Payer: Self-pay

## 2024-04-24 VITALS — BP 125/81 | HR 102 | Ht 64.0 in | Wt 245.2 lb

## 2024-04-24 DIAGNOSIS — G5603 Carpal tunnel syndrome, bilateral upper limbs: Secondary | ICD-10-CM

## 2024-04-24 DIAGNOSIS — M542 Cervicalgia: Secondary | ICD-10-CM

## 2024-04-24 NOTE — Progress Notes (Signed)
 Orthopedic Spine Surgery Office Note  Assessment: Patient is a 83 y.o. female with several symptoms including neck pain, bilateral lateral arm pain, and bilateral hand numbness and paresthesias   Plan: -Explained that initially conservative treatment is tried as a significant number of patients may experience relief with these treatment modalities. Discussed that the conservative treatments include:  -activity modification  -physical therapy  -over the counter pain medications  -medrol  dosepak  -cervical steroid injections -Patient has tried PT -Recommended diagnostic/therapeutic injection for her neck and bilateral lateral arm pain -In regards to her numbness and paresthesias in the hands, this could represent carpal tunnel syndrome or cervical myelopathy.  She has no physical exam findings of either, so recommended a EMG/NCS to work this up further -Patient should return to office in 6 weeks, x-rays at next visit: None   Patient expressed understanding of the plan and all questions were answered to the patient's satisfaction.   ___________________________________________________________________________   History:  Patient is a 83 y.o. female who presents today for cervical spine.  Patient reports several months of neck pain and difficulty turning to the left.  She also has pain that has been going on for over a year in the bilateral lateral arms.  Last month, she has noticed numbness and paresthesias in her bilateral hands.  She has had difficulty with fine motor skills in the hands as a result of the sensation.  She specifically says she has had trouble with buttoning shirts and this.  She has not had any imbalance or unsteadiness.  She ambulates without any assistive devices.  There is no trauma or injury that preceded the onset of her symptoms.   Weakness: Denies Difficulty with fine motor skills (e.g., buttoning shirts, handwriting): Yes, has had difficulty buttoning shirts and  putting on earrings since the decrease sensation in her hands started about a month ago Symptoms of imbalance: Denies Paresthesias and numbness: Yes, has numbness and paresthesias in all the fingers.  No other numbness or paresthesias Bowel or bladder incontinence: Denies Saddle anesthesia: Denies  Treatments tried: PT  Review of systems: Denies fevers and chills, night sweats, unexplained weight loss, history of cancer, pain that wakes her at night  Past medical history: HLD HTN DM (last A1c was 6.1 on 04/21/2024) GERD  Allergies: NKDA  Past surgical history:  Right shoulder arthroscopy  Social history: Denies use of nicotine product (smoking, vaping, patches, smokeless) Alcohol use: Denies Denies recreational drug use   Physical Exam:  BMI of 42.1  General: no acute distress, appears stated age Neurologic: alert, answering questions appropriately, following commands Respiratory: unlabored breathing on room air, symmetric chest rise Psychiatric: appropriate affect, normal cadence to speech   MSK (spine):  -Strength exam      Left  Right Grip strength                5/5  5/5 Interosseus   5/5   5/5 Wrist extension  5/5  5/5 Wrist flexion   5/5  5/5 Elbow flexion   5/5  5/5 Deltoid    5/5  5/5  -Sensory exam    Sensation intact to light touch in C5-T1 nerve distributions of bilateral upper extremities  -Brachioradialis DTR: 2/4 on the left, 2/4 on the right -Biceps DTR: 2/4 on the left, 2/4 on the right  -Spurling: Negative bilaterally -Hoffman sign: Negative bilaterally -Clonus: No beats bilaterally -Interosseous wasting: None seen -Grip and release test: Negative -Romberg: Negative -Gait: Normal  Tinel's at wrist: negative bilaterally  Phalen's at wrist: negative bilaterally Durkan's: negative bilaterally  Imaging: XRs of the cervical spine from 04/20/2024 were independently reviewed and interpreted, showing disc height loss at multiple levels  throughout the cervical spine.  Anterior osteophyte formation seen at C2/3, C3/4, C4/5, C5/6.  Vacuum disc phenomenon at C5/6.  No evidence of instability on flexion/extension views.  No fracture or dislocation seen.  MRI of the cervical spine from 03/24/2024 was independently reviewed and interpreted, showing DDD throughout the cervical spine.  Disc herniation at C3/4 resulting in central stenosis.  Bilateral foraminal stenosis at C3-4.  Bilateral foraminal stenosis at C4/5.  Right-sided foraminal stenosis at C5/6.  No T2 cord signal change seen.   Patient name: Danielle Pierce Patient MRN: [id number] Date of visit: 04/24/24

## 2024-04-28 ENCOUNTER — Other Ambulatory Visit: Payer: Medicare PPO

## 2024-05-01 NOTE — Patient Instructions (Signed)
 1.) Visit your local pharmacy to receive your shingles vaccine

## 2024-05-02 ENCOUNTER — Other Ambulatory Visit: Payer: Self-pay | Admitting: Nurse Practitioner

## 2024-05-02 ENCOUNTER — Ambulatory Visit (INDEPENDENT_AMBULATORY_CARE_PROVIDER_SITE_OTHER): Payer: Medicare PPO | Admitting: Nurse Practitioner

## 2024-05-02 ENCOUNTER — Encounter: Payer: Self-pay | Admitting: Nurse Practitioner

## 2024-05-02 VITALS — BP 130/80 | HR 65 | Temp 97.6°F | Ht 64.0 in | Wt 248.6 lb

## 2024-05-02 DIAGNOSIS — E785 Hyperlipidemia, unspecified: Secondary | ICD-10-CM | POA: Diagnosis not present

## 2024-05-02 DIAGNOSIS — E559 Vitamin D deficiency, unspecified: Secondary | ICD-10-CM

## 2024-05-02 DIAGNOSIS — M4722 Other spondylosis with radiculopathy, cervical region: Secondary | ICD-10-CM | POA: Diagnosis not present

## 2024-05-02 DIAGNOSIS — Z6841 Body Mass Index (BMI) 40.0 and over, adult: Secondary | ICD-10-CM | POA: Diagnosis not present

## 2024-05-02 DIAGNOSIS — N1831 Chronic kidney disease, stage 3a: Secondary | ICD-10-CM

## 2024-05-02 DIAGNOSIS — I10 Essential (primary) hypertension: Secondary | ICD-10-CM

## 2024-05-02 DIAGNOSIS — E1122 Type 2 diabetes mellitus with diabetic chronic kidney disease: Secondary | ICD-10-CM

## 2024-05-02 DIAGNOSIS — M8580 Other specified disorders of bone density and structure, unspecified site: Secondary | ICD-10-CM

## 2024-05-02 DIAGNOSIS — E66813 Obesity, class 3: Secondary | ICD-10-CM | POA: Diagnosis not present

## 2024-05-02 NOTE — Progress Notes (Signed)
 Careteam: Patient Care Team: Caro Harlene POUR, NP as PCP - General (Geriatric Medicine) Anderson Maude ORN, MD (Inactive) as Consulting Physician (Orthopedic Surgery) Ivin Kocher, MD as Consulting Physician (Dermatology) Avram Lupita BRAVO, MD as Consulting Physician (Gastroenterology) Waylan Cain, MD as Consulting Physician (Ophthalmology)  PLACE OF SERVICE:  [facility]  Advanced Directive information    No Known Allergies  Chief Complaint  Patient presents with   Medical Management of Chronic Issues    6 month follow-up. Discussed need for shingles vaccine. [facility] verified . Patient states that her hands are feeling cold, numb and like pins and needles are sticking them. Patient is seeing an orthopedic about this but she still wanted to talk to you about it as well.    HPI:  Discussed the use of AI scribe software for clinical note transcription with the patient, who gave verbal consent to proceed.  History of Present Illness Danielle Pierce is an 83 year old female with chronic kidney disease and diabetes who presents for a six-month follow-up.  She consulted with an orthopedic specialist regarding her neck pain and numbness in her hands and is scheduled to receive a shot on the 20th. A nerve conduction test was performed to evaluate ongoing nerve concerns. No significant neck pain, only discomfort when turning her neck. No back or knee pain. She plans to resume walking and treadmill exercises.  She has been taking Ozempic  for approximately six to seven weeks, starting about two weeks after receiving the prescription. She has experienced a decrease in appetite but no significant weight loss, with her weight fluctuating from 250 pounds in May to 248 pounds currently. No side effects such as nausea, vomiting, diarrhea, or constipation.  Recent lab work showed an increase in microalbumin levels. Her diuretic was discontinued, and her losartan  dosage was increased to 100  mg. She has not experienced an increase in swelling, and her blood pressure remains stable.  Her diabetes management shows improvement, with her A1c decreasing from 6.4% a year ago to 6.1% currently.   She continues to take B12, vitamin C, and vitamin E supplements  She is on Lipitor for high cholesterol, with a current LDL level of 83 mg/dL.   She also takes aspirin daily.     Review of Systems:  Review of Systems  Constitutional:  Negative for chills, fever and weight loss.  HENT:  Negative for tinnitus.   Respiratory:  Negative for cough, sputum production and shortness of breath.   Cardiovascular:  Negative for chest pain, palpitations and leg swelling.  Gastrointestinal:  Negative for abdominal pain, constipation, diarrhea and heartburn.  Genitourinary:  Negative for dysuria, frequency and urgency.  Musculoskeletal:  Positive for neck pain. Negative for back pain, falls, joint pain and myalgias.  Skin: Negative.   Neurological:  Positive for tingling and focal weakness. Negative for dizziness and headaches.  Psychiatric/Behavioral:  Negative for depression and memory loss. The patient does not have insomnia.     Past Medical History:  Diagnosis Date   Arthritis    Ankle   Asymptomatic varicose veins    Benign neoplasm of colon    Bilateral shoulder pain 07/02/14   Cataract    Bilateral - just watching   Cervicitis and endocervicitis 09/13/2011   Disorder of bone and cartilage, unspecified    Disorders of bursae and tendons in shoulder region, unspecified    External hemorrhoids without mention of complication    Fibrocystic breast    Generalized osteoarthrosis, unspecified  site    GERD (gastroesophageal reflux disease)    diet controlled, No meds   Hiatal hernia    Hypertension    Impacted cerumen 11/07/2011   Obesity, unspecified    Other diseases of nasal cavity and sinuses(478.19)    Pain in joint, ankle and foot    Pain in joint, pelvic region and thigh     Pain in joint, shoulder region    Reflux esophagitis    SVD (spontaneous vaginal delivery)    x 2   Unspecified essential hypertension    Unspecified vitamin D  deficiency    Past Surgical History:  Procedure Laterality Date   COLONOSCOPY  2006   Dr.Orr   COLONOSCOPY  07/08/2013   Dr. Avram   FLEXIBLE SIGMOIDOSCOPY  1990   Hemorrhoids    MOUTH SURGERY  2005   DR LUTINS - tooth ext and gum surgery   SHOULDER ARTHROSCOPY W/ ACROMIAL REPAIR Right 07/02/14   Dr. Anderson   UPPER GASTROINTESTINAL ENDOSCOPY     normal   Social History:   reports that she has never smoked. She has never used smokeless tobacco. She reports current alcohol use. She reports that she does not use drugs.  Family History  Problem Relation Age of Onset   Hypertension Mother    Kidney disease Mother        Renal failure   Cancer Brother        Lymphoma   ADD / ADHD Son    Seizures Son    Hypertension Sister    Dementia Sister    Hypertension Sister    Hypertension Sister    Cancer - Other Sister    Early death Brother        Some type of accident   Colon cancer Father 85   Stomach cancer Neg Hx    Rectal cancer Neg Hx     Medications: Patient's Medications  New Prescriptions   No medications on file  Previous Medications   ALBUTEROL  (VENTOLIN  HFA) 108 (90 BASE) MCG/ACT INHALER    Inhale 2 puffs into the lungs every 6 (six) hours as needed for wheezing or shortness of breath.   ASPIRIN 81 MG TABLET    Take 81 mg by mouth daily.   ATORVASTATIN  (LIPITOR) 20 MG TABLET    Take 1 tablet (20 mg total) by mouth at bedtime.   CALCIUM  CARBONATE-VITAMIN D  (CALCIUM -VITAMIN D ) 500-200 MG-UNIT PER TABLET    Take 1 tablet by mouth daily.   CARBOXYMETHYLCELL-HYPROMELLOSE 0.25-0.3 % GEL    Apply 1 application to eye daily. to alleviate irritation.   CHOLECALCIFEROL (VITAMIN D -3 PO)    Take 1 tablet by mouth daily.   GUAIFENESIN -DEXTROMETHORPHAN (ROBITUSSIN DM) 100-10 MG/5ML SYRUP    Take 10 mLs by mouth  every 6 (six) hours as needed for cough.   HYDROCORTISONE  (ANUSOL -HC) 25 MG SUPPOSITORY    Place 1 suppository (25 mg total) rectally daily as needed for hemorrhoids or anal itching.   LOSARTAN  (COZAAR ) 100 MG TABLET    Take 1 tablet (100 mg total) by mouth daily.   PROTEIN POWD    Take 1 Scoop by mouth daily.   SEMAGLUTIDE ,0.25 OR 0.5MG /DOS, (OZEMPIC , 0.25 OR 0.5 MG/DOSE,) 2 MG/3ML SOPN    INJECT 0.25MG  INTO THE SKIN ONCE A WEEK   VITAMIN B-12 (CYANOCOBALAMIN) 500 MCG TABLET    Take 500 mcg by mouth daily.   VITAMIN C (ASCORBIC ACID) 500 MG TABLET    Take 500 mg by mouth daily.  VITAMIN E 400 UNIT CAPSULE    Take 400 Units by mouth daily.  Modified Medications   No medications on file  Discontinued Medications   CHLORHEXIDINE (PERIDEX) 0.12 % SOLUTION    15 mLs.   SIMETHICONE  (MYLICON) 80 MG CHEWABLE TABLET    Chew 80 mg by mouth as needed for flatulence.   TURMERIC PO    Take 1 tablet by mouth as needed. Also taking turmeric liquid supplement    Physical Exam:  Vitals:   05/02/24 1048  BP: 130/80  Pulse: 65  Temp: 97.6 F (36.4 C)  Weight: 248 lb 9.6 oz (112.8 kg)  Height: 5' 4 (1.626 m)   Body mass index is 42.67 kg/m. Wt Readings from Last 3 Encounters:  05/02/24 248 lb 9.6 oz (112.8 kg)  04/24/24 245 lb 3.2 oz (111.2 kg)  04/21/24 246 lb 9.6 oz (111.9 kg)    Physical Exam Constitutional:      General: She is not in acute distress.    Appearance: She is well-developed. She is not diaphoretic.  HENT:     Head: Normocephalic and atraumatic.     Mouth/Throat:     Pharynx: No oropharyngeal exudate.  Eyes:     Conjunctiva/sclera: Conjunctivae normal.     Pupils: Pupils are equal, round, and reactive to light.  Cardiovascular:     Rate and Rhythm: Normal rate and regular rhythm.     Heart sounds: Normal heart sounds.  Pulmonary:     Effort: Pulmonary effort is normal.     Breath sounds: Normal breath sounds.  Abdominal:     General: Bowel sounds are normal.      Palpations: Abdomen is soft.  Musculoskeletal:     Cervical back: Normal range of motion and neck supple.     Right lower leg: No edema.     Left lower leg: No edema.  Skin:    General: Skin is warm and dry.  Neurological:     Mental Status: She is alert.  Psychiatric:        Mood and Affect: Mood normal.     Labs reviewed: Basic Metabolic Panel: Recent Labs    10/29/23 0928 01/31/24 1456 04/21/24 1413  NA 142 144 141  K 4.2 4.2 4.1  CL 105 108 105  CO2 27 30 28   GLUCOSE 102* 79 100*  BUN 13 12 13   CREATININE 1.08* 1.05* 1.04*  CALCIUM  9.5 9.5 9.5   Liver Function Tests: Recent Labs    10/29/23 0928 01/31/24 1456 04/21/24 1413  AST 22 23 25   ALT 15 14 17   BILITOT 0.7 0.7 0.7  PROT 6.7 6.3 6.5   No results for input(s): LIPASE, AMYLASE in the last 8760 hours. No results for input(s): AMMONIA in the last 8760 hours. CBC: Recent Labs    10/29/23 0928 01/31/24 1456 04/21/24 1413  WBC 7.5 9.0 7.7  NEUTROABS 3,600 5,031 4,474  HGB 13.3 12.9 13.5  HCT 40.9 39.6 40.8  MCV 93.8 93.0 94.9  PLT 188 208 192   Lipid Panel: Recent Labs    04/21/24 1413  CHOL 159  HDL 59  LDLCALC 83  TRIG 79  CHOLHDL 2.7   TSH: No results for input(s): TSH in the last 8760 hours. A1C: Lab Results  Component Value Date   HGBA1C 6.1 (H) 04/21/2024     Assessment/Plan  Vitamin D  deficiency Assessment & Plan: Continue on supplement.    Hyperlipidemia, unspecified hyperlipidemia type Assessment & Plan: Continues on lipitor 20  mg daily with dietary modifications   Type 2 diabetes mellitus with stage 3a chronic kidney disease, without long-term current use of insulin (HCC) Assessment & Plan: A1c improved on ozempic  with dietary modifications. Encouraged dietary compliance, routine foot care/monitoring and to keep up with diabetic eye exams through ophthalmology     Class 3 severe obesity due to excess calories with serious comorbidity and body mass index  (BMI) of 45.0 to 49.9 in adult Assessment & Plan: -education provided on healthy weight loss through increase in physical activity and proper nutrition. Continues on ozempic  which has decreased appetite as well. No significant side effects reported.    Stage 3a chronic kidney disease (HCC) Assessment & Plan: Chronic and stable Encourage proper hydration Follow metabolic panel Avoid nephrotoxic meds (NSAIDS)   Essential hypertension Assessment & Plan: Blood pressure well controlled with change- hydrochlorothiazide  stopped and losartan  increased to 100 mg daily Continue current medications and dietary modifications follow metabolic panel    Osteoarthritis of spine with radiculopathy, cervical region Assessment & Plan: Ongoing, followed by orthopedic with planned injection and nerve conduction studies planned.   Osteopenia, unspecified location Assessment & Plan: Continue to take calcium  600 mg twice daily with Vitamin D  2000 units daily and weight bearing activity 30 mins/5 days a week       Return in about 4 months (around 09/01/2024) for routine follow up.  Ansar Skoda K. Caro BODILY [facility] [phone]

## 2024-05-02 NOTE — Assessment & Plan Note (Signed)
 Chronic and stable Encourage proper hydration Follow metabolic panel Avoid nephrotoxic meds (NSAIDS)

## 2024-05-02 NOTE — Assessment & Plan Note (Signed)
-  education provided on healthy weight loss through increase in physical activity and proper nutrition. Continues on ozempic  which has decreased appetite as well. No significant side effects reported.

## 2024-05-02 NOTE — Assessment & Plan Note (Signed)
 Continue to take calcium  600 mg twice daily with Vitamin D  2000 units daily and weight bearing activity 30 mins/5 days a week

## 2024-05-02 NOTE — Assessment & Plan Note (Signed)
 Ongoing, followed by orthopedic with planned injection and nerve conduction studies planned.

## 2024-05-02 NOTE — Assessment & Plan Note (Signed)
 Blood pressure well controlled with change- hydrochlorothiazide  stopped and losartan  increased to 100 mg daily Continue current medications and dietary modifications follow metabolic panel

## 2024-05-02 NOTE — Assessment & Plan Note (Signed)
 Continues on lipitor 20 mg daily with dietary modifications

## 2024-05-02 NOTE — Assessment & Plan Note (Signed)
 A1c improved on ozempic  with dietary modifications. Encouraged dietary compliance, routine foot care/monitoring and to keep up with diabetic eye exams through ophthalmology

## 2024-05-02 NOTE — Assessment & Plan Note (Signed)
Continue on supplement.

## 2024-05-13 DIAGNOSIS — Z1231 Encounter for screening mammogram for malignant neoplasm of breast: Secondary | ICD-10-CM | POA: Diagnosis not present

## 2024-05-13 LAB — HM MAMMOGRAPHY

## 2024-05-14 ENCOUNTER — Ambulatory Visit: Admitting: Physical Medicine and Rehabilitation

## 2024-05-14 DIAGNOSIS — M79602 Pain in left arm: Secondary | ICD-10-CM | POA: Diagnosis not present

## 2024-05-14 DIAGNOSIS — M79642 Pain in left hand: Secondary | ICD-10-CM

## 2024-05-14 DIAGNOSIS — M542 Cervicalgia: Secondary | ICD-10-CM

## 2024-05-14 DIAGNOSIS — M79641 Pain in right hand: Secondary | ICD-10-CM | POA: Diagnosis not present

## 2024-05-14 DIAGNOSIS — R202 Paresthesia of skin: Secondary | ICD-10-CM | POA: Diagnosis not present

## 2024-05-14 DIAGNOSIS — M79601 Pain in right arm: Secondary | ICD-10-CM | POA: Diagnosis not present

## 2024-05-14 DIAGNOSIS — R29898 Other symptoms and signs involving the musculoskeletal system: Secondary | ICD-10-CM

## 2024-05-14 NOTE — Progress Notes (Unsigned)
 Danielle Pierce - 83 y.o. female MRN [id number]  Date of birth: October 05, 1940  Office Visit Note: Visit Date: 05/14/2024 PCP: Caro Harlene POUR, NP Referred by: Georgina Ozell LABOR, MD  Subjective: Chief Complaint  Patient presents with   Left Hand - Numbness, Weakness   Right Hand - Numbness, Weakness   HPI: Danielle Pierce is a 83 y.o. female who comes in todayHPI   ROS Otherwise per HPI.  Assessment & Plan: Visit Diagnoses: No diagnosis found.   Plan: No additional findings.   Meds & Orders: No orders of the defined types were placed in this encounter.  No orders of the defined types were placed in this encounter.   Follow-up: No follow-ups on file.   Procedures: No procedures performed      Clinical History: Narrative & Impression CLINICAL DATA:  Weakness in the neck, shoulders and hands for approximately 4 weeks. No known injury.   EXAM: MRI CERVICAL SPINE WITHOUT CONTRAST   TECHNIQUE: Multiplanar, multisequence MR imaging of the cervical spine was performed. No intravenous contrast was administered.   COMPARISON:  Plain film cervical spine 02/08/2024.   FINDINGS: Alignment: There is straightening of the normal cervical lordosis and trace anterolisthesis C7 on T1.   Vertebrae: No fracture, evidence of discitis, or bone lesion.   Cord: Mild, hazy edema is seen within the cord at the C3-4 level.   Posterior Fossa, vertebral arteries, paraspinal tissues: Negative.   Disc levels:   C2-3: Small central disc protrusion and mild to moderate facet degenerative change. The central canal and foramina remain open.   C3-4: Disc osteophyte complex with a superimposed central disc protrusion which appears to be calcified. The cord is severely flattened. There is moderately severe to severe bilateral foraminal narrowing, worse on the left. Facet arthropathy is also worse on the left with mild marrow edema seen in the left facets.   C4-5: Disc osteophyte  complex and uncovertebral spurring. The ventral cord is flattened. Moderately severe to severe foraminal narrowing is worse on the left.   C5-6: Disc osteophyte complex flattens the ventral cord. Uncovertebral and facet arthropathy cause moderately severe to severe foraminal narrowing, worse on the right.   C6-7: Shallow disc bulge and uncovertebral spurring. The central canal is open. Mild bilateral foraminal narrowing is present.   C7-T1: The disc is uncovered with a superimposed central and right paracentral protrusion. The ventral thecal sac is nearly effaced. Mild right foraminal narrowing is seen. The left foramen is open. Bilateral facet arthropathy is present with secondary mild marrow edema in the right facets.   IMPRESSION: 1. Severe central canal stenosis at C3-4 where a central disc protrusion appears to be calcified. The cord is severely flattened and there is mild edema in the cord at this level. 2. Flattening of the ventral cord at C4-5 and C5-6. 3. Moderately severe to severe foraminal narrowing bilaterally at C3-4, C4-5 and C5-6. 4. Central and right paracentral protrusion at C7-T1 nearly effaces the ventral thecal sac. 5. Facet arthropathy at C3-4 and C7-T1 with secondary mild marrow edema in the facets.     Electronically Signed   By: Debby Prader M.D.   On: 04/03/2024 10:41   She reports that she has never smoked. She has never used smokeless tobacco.  Recent Labs    10/29/23 0928 04/21/24 1413  HGBA1C 6.3* 6.1*    Objective:  VS:  HT:    WT:   BMI:     BP:   HR: bpm  TEMP: ( )  RESP:  Physical Exam  Ortho Exam  Imaging: No results found.  Past Medical/Family/Surgical/Social History: Medications & Allergies reviewed per EMR, new medications updated. Patient Active Problem List   Diagnosis Date Noted   Stage 3a chronic kidney disease (HCC) 05/02/2024   Osteoarthritis of spine with radiculopathy, cervical region 05/02/2024    Osteopenia 05/02/2024   Radiculopathy, cervical region 03/13/2024   Trigger thumb, right thumb 07/27/2020   Body mass index (BMI) of 40.1-44.9 in adult [facility]) 11/14/2018   Localized osteoarthritis of left knee 05/10/2017   HLD (hyperlipidemia) 01/19/2016   Lumbago 04/08/2014   Internal and external bleeding hemorrhoids 07/08/2013   DM (diabetes mellitus), type 2 with renal complications (HCC) 02/20/2013   Essential hypertension 02/20/2013   Obesity    Vitamin D  deficiency    Past Medical History:  Diagnosis Date   Arthritis    Ankle   Asymptomatic varicose veins    Benign neoplasm of colon    Bilateral shoulder pain 07/02/14   Cataract    Bilateral - just watching   Cervicitis and endocervicitis 09/13/2011   Disorder of bone and cartilage, unspecified    Disorders of bursae and tendons in shoulder region, unspecified    External hemorrhoids without mention of complication    Fibrocystic breast    Generalized osteoarthrosis, unspecified site    GERD (gastroesophageal reflux disease)    diet controlled, No meds   Hiatal hernia    Hypertension    Impacted cerumen 11/07/2011   Obesity, unspecified    Other diseases of nasal cavity and sinuses(478.19)    Pain in joint, ankle and foot    Pain in joint, pelvic region and thigh    Pain in joint, shoulder region    Reflux esophagitis    SVD (spontaneous vaginal delivery)    x 2   Unspecified essential hypertension    Unspecified vitamin D  deficiency    Family History  Problem Relation Age of Onset   Hypertension Mother    Kidney disease Mother        Renal failure   Cancer Brother        Lymphoma   ADD / ADHD Son    Seizures Son    Hypertension Sister    Dementia Sister    Hypertension Sister    Hypertension Sister    Cancer - Other Sister    Early death Brother        Some type of accident   Colon cancer Father 4   Stomach cancer Neg Hx    Rectal cancer Neg Hx    Past Surgical History:  Procedure Laterality  Date   COLONOSCOPY  2006   Dr.Orr   COLONOSCOPY  07/08/2013   Dr. Avram   FLEXIBLE SIGMOIDOSCOPY  1990   Hemorrhoids    MOUTH SURGERY  2005   DR LUTINS - tooth ext and gum surgery   SHOULDER ARTHROSCOPY W/ ACROMIAL REPAIR Right 07/02/14   Dr. Anderson   UPPER GASTROINTESTINAL ENDOSCOPY     normal   Social History   Occupational History   Occupation: retired Camera operator  Tobacco Use   Smoking status: Never   Smokeless tobacco: Never  Vaping Use   Vaping status: Never Used  Substance and Sexual Activity   Alcohol use: Yes    Comment: occasional mixed drink    Drug use: No   Sexual activity: Not Currently    Birth control/protection: Post-menopausal

## 2024-05-14 NOTE — Progress Notes (Unsigned)
 Pain Scale   Average Pain 0 Patient advising she has numbness and tingling in both hands without pain.        +Driver, -BT, -Dye Allergies.

## 2024-05-15 ENCOUNTER — Encounter: Payer: Self-pay | Admitting: Nurse Practitioner

## 2024-05-18 NOTE — Procedures (Unsigned)
 EMG & NCV Findings: Evaluation of the left median motor and the right median motor nerves showed prolonged distal onset latency (L5.0, R6.4 ms) and decreased conduction velocity (Elbow-Wrist, L45, R48 m/s).  The left median (across palm) sensory nerve showed no response (Palm) and prolonged distal peak latency (4.8 ms).  The right median (across palm) sensory nerve showed prolonged distal peak latency (Wrist, 6.3 ms), reduced amplitude (5.0 V), and prolonged distal peak latency (Palm, 3.5 ms).  All remaining nerves (as indicated in the following tables) were within normal limits.  Left vs. Right side comparison data for the median motor nerve indicates abnormal L-R latency difference (1.4 ms).    All examined muscles (as indicated in the following table) showed no evidence of electrical instability.    Impression: The above electrodiagnostic study is ABNORMAL and reveals evidence of: a moderate to severe right median nerve entrapment at the wrist (carpal tunnel syndrome) affecting sensory and motor components.   a moderate left median nerve entrapment at the wrist (carpal tunnel syndrome) affecting sensory and motor components.   There is no significant electrodiagnostic evidence of any other focal nerve entrapment, brachial plexopathy or cervical radiculopathy.  Recommendations: 1.  Follow-up with referring physician. 2.  Continue current management of symptoms. 3.  Continue use of resting splint at night-time and as needed during the day. 4.  Suggest surgical evaluation.  ___________________________ Prentice Masters FAAPMR Board Certified, American Board of Physical Medicine and Rehabilitation    Nerve Conduction Studies Anti Sensory Summary Table   Stim Site NR Peak (ms) Norm Peak (ms) P-T Amp (V) Norm P-T Amp Site1 Site2 Delta-P (ms) Dist (cm) Vel (m/s) Norm Vel (m/s)  Left Median Acr Palm Anti Sensory (2nd Digit)  31.9C  Wrist    *4.8 <3.6 14.6 >10 Wrist Palm  0.0    Palm *NR  <2.0           Right Median Acr Palm Anti Sensory (2nd Digit)  31C  Wrist    *6.3 <3.6 *5.0 >10 Wrist Palm 2.8 0.0    Palm    *3.5 <2.0 0.3         Right Radial Anti Sensory (Base 1st Digit)  31.2C  Wrist    2.3 <3.1 18.1  Wrist Base 1st Digit 2.3 0.0    Right Ulnar Anti Sensory (5th Digit)  31.3C  Wrist    3.6 <3.7 18.1 >15.0 Wrist 5th Digit 3.6 14.0 39 >38   Motor Summary Table   Stim Site NR Onset (ms) Norm Onset (ms) O-P Amp (mV) Norm O-P Amp Site1 Site2 Delta-0 (ms) Dist (cm) Vel (m/s) Norm Vel (m/s)  Left Median Motor (Abd Poll Brev)  32.3C  Wrist    *5.0 <4.2 6.8 >5 Elbow Wrist 4.8 21.5 *45 >50  Elbow    9.8  6.5         Right Median Motor (Abd Poll Brev)  31.2C  Wrist    *6.4 <4.2 7.3 >5 Elbow Wrist 4.6 22.0 *48 >50  Elbow    11.0  7.7         Right Ulnar Motor (Abd Dig Min)  31.5C  Wrist    3.0 <4.2 8.9 >3 B Elbow Wrist 3.6 20.0 56 >53  B Elbow    6.6  7.1  A Elbow B Elbow 1.5 10.0 67 >53  A Elbow    8.1  6.7          EMG   Side Muscle Nerve Root Ins Act  Fibs Psw Amp Dur Poly Recrt Int Bruna Comment  Right Abd Poll Brev Median C8-T1 Nml Nml Nml Nml Nml 0 Nml Nml   Right 1stDorInt Ulnar C8-T1 Nml Nml Nml Nml Nml 0 Nml Nml   Right PronatorTeres Median C6-7 Nml Nml Nml Nml Nml 0 Nml Nml   Right Biceps Musculocut C5-6 Nml Nml Nml Nml Nml 0 Nml Nml   Right Deltoid Axillary C5-6 Nml Nml Nml Nml Nml 0 Nml Nml     Nerve Conduction Studies Anti Sensory Left/Right Comparison   Stim Site L Lat (ms) R Lat (ms) L-R Lat (ms) L Amp (V) R Amp (V) L-R Amp (%) Site1 Site2 L Vel (m/s) R Vel (m/s) L-R Vel (m/s)  Median Acr Palm Anti Sensory (2nd Digit)  31.9C  Wrist *4.8 *6.3 1.5 14.6 *5.0 65.8 Wrist Palm     Palm  *3.5   0.3        Radial Anti Sensory (Base 1st Digit)  31.2C  Wrist  2.3   18.1  Wrist Base 1st Digit     Ulnar Anti Sensory (5th Digit)  31.3C  Wrist  3.6   18.1  Wrist 5th Digit  39    Motor Left/Right Comparison   Stim Site L Lat (ms) R Lat (ms) L-R Lat (ms) L Amp  (mV) R Amp (mV) L-R Amp (%) Site1 Site2 L Vel (m/s) R Vel (m/s) L-R Vel (m/s)  Median Motor (Abd Poll Brev)  32.3C  Wrist *5.0 *6.4 *1.4 6.8 7.3 6.8 Elbow Wrist *45 *48 3  Elbow 9.8 11.0 1.2 6.5 7.7 15.6       Ulnar Motor (Abd Dig Min)  31.5C  Wrist  3.0   8.9  B Elbow Wrist  56   B Elbow  6.6   7.1  A Elbow B Elbow  67   A Elbow  8.1   6.7           Waveforms:

## 2024-05-21 ENCOUNTER — Ambulatory Visit: Admitting: Physical Medicine and Rehabilitation

## 2024-05-21 ENCOUNTER — Encounter: Payer: Self-pay | Admitting: Physical Medicine and Rehabilitation

## 2024-05-21 ENCOUNTER — Other Ambulatory Visit: Payer: Self-pay

## 2024-05-21 VITALS — BP 183/94

## 2024-05-21 DIAGNOSIS — M5412 Radiculopathy, cervical region: Secondary | ICD-10-CM | POA: Diagnosis not present

## 2024-05-21 MED ORDER — METHYLPREDNISOLONE ACETATE 40 MG/ML IJ SUSP
40.0000 mg | Freq: Once | INTRAMUSCULAR | Status: AC
Start: 2024-05-21 — End: 2024-05-21
  Administered 2024-05-21: 40 mg

## 2024-05-21 NOTE — Patient Instructions (Signed)

## 2024-05-21 NOTE — Progress Notes (Signed)
 Pain Scale   Average Pain 5   Patient said she has pain int he left side of neck, arms and hands numb.     +Driver, -BT, -Dye Allergies.

## 2024-05-25 NOTE — Progress Notes (Signed)
 RAYETTE MOGG - 83 y.o. female MRN [id number]  Date of birth: [birth year]  Office Visit Note: Visit Date: 05/21/2024 PCP: Caro Harlene POUR, NP Referred by: Georgina Ozell LABOR, MD  Subjective: Chief Complaint  Patient presents with   Neck - Pain   HPI:  ISMAEL TREPTOW is a 83 y.o. female who comes in today at the request of Dr. Ozell Georgina for planned Left C7-T1 Cervical Interlaminar epidural steroid injection with fluoroscopic guidance.  The patient has failed conservative care including home exercise, medications, time and activity modification.  This injection will be diagnostic and hopefully therapeutic.  Please see requesting physician notes for further details and justification.   ROS Otherwise per HPI.  Assessment & Plan: Visit Diagnoses:    ICD-10-CM   1. Cervical radiculopathy  M54.12 XR C-ARM NO REPORT    Epidural Steroid injection    methylPREDNISolone  acetate (DEPO-MEDROL ) injection 40 mg      Plan: No additional findings.   Meds & Orders:  Meds ordered this encounter  Medications   methylPREDNISolone  acetate (DEPO-MEDROL ) injection 40 mg    Orders Placed This Encounter  Procedures   XR C-ARM NO REPORT   Epidural Steroid injection    Follow-up: Return for visit to requesting provider as needed.   Procedures: No procedures performed  Cervical Epidural Steroid Injection - Interlaminar Approach with Fluoroscopic Guidance  Patient: AZA DANTES      Date of Birth: Feb 24, 1941 MRN: [id number] PCP: Caro Harlene POUR, NP      Visit Date: 05/21/2024   Universal Protocol:    Date/Time: 08/24/257:27 PM  Consent Given By: the patient  Position: PRONE  Additional Comments: Vital signs were monitored before and after the procedure. Patient was prepped and draped in the usual sterile fashion. The correct patient, procedure, and site was verified.   Injection Procedure Details:   Procedure diagnoses: Cervical radiculopathy [M54.12]     Meds Administered:  Meds ordered this encounter  Medications   methylPREDNISolone  acetate (DEPO-MEDROL ) injection 40 mg     Laterality: Left  Location/Site: C7-T1  Needle: 3.5 in., 20 ga. Tuohy  Needle Placement: Paramedian epidural space  Findings:  -Comments: Excellent flow of contrast into the epidural space.  Procedure Details: Using a paramedian approach from the side mentioned above, the region overlying the inferior lamina was localized under fluoroscopic visualization and the soft tissues overlying this structure were infiltrated with 4 ml. of 1% Lidocaine  without Epinephrine. A # 20 gauge, Tuohy needle was inserted into the epidural space using a paramedian approach.  The epidural space was localized using loss of resistance along with contralateral oblique bi-planar fluoroscopic views.  After negative aspirate for air, blood, and CSF, a 2 ml. volume of Isovue-250 was injected into the epidural space and the flow of contrast was observed. Radiographs were obtained for documentation purposes.   The injectate was administered into the level noted above.  Additional Comments:  The patient tolerated the procedure well Dressing: 2 x 2 sterile gauze and Band-Aid    Post-procedure details: Patient was observed during the procedure. Post-procedure instructions were reviewed.  Patient left the clinic in stable condition.   Clinical History: Narrative & Impression CLINICAL DATA:  Weakness in the neck, shoulders and hands for approximately 4 weeks. No known injury.   EXAM: MRI CERVICAL SPINE WITHOUT CONTRAST   TECHNIQUE: Multiplanar, multisequence MR imaging of the cervical spine was performed. No intravenous contrast was administered.   COMPARISON:  Plain film cervical spine 02/08/2024.  FINDINGS: Alignment: There is straightening of the normal cervical lordosis and trace anterolisthesis C7 on T1.   Vertebrae: No fracture, evidence of discitis, or bone  lesion.   Cord: Mild, hazy edema is seen within the cord at the C3-4 level.   Posterior Fossa, vertebral arteries, paraspinal tissues: Negative.   Disc levels:   C2-3: Small central disc protrusion and mild to moderate facet degenerative change. The central canal and foramina remain open.   C3-4: Disc osteophyte complex with a superimposed central disc protrusion which appears to be calcified. The cord is severely flattened. There is moderately severe to severe bilateral foraminal narrowing, worse on the left. Facet arthropathy is also worse on the left with mild marrow edema seen in the left facets.   C4-5: Disc osteophyte complex and uncovertebral spurring. The ventral cord is flattened. Moderately severe to severe foraminal narrowing is worse on the left.   C5-6: Disc osteophyte complex flattens the ventral cord. Uncovertebral and facet arthropathy cause moderately severe to severe foraminal narrowing, worse on the right.   C6-7: Shallow disc bulge and uncovertebral spurring. The central canal is open. Mild bilateral foraminal narrowing is present.   C7-T1: The disc is uncovered with a superimposed central and right paracentral protrusion. The ventral thecal sac is nearly effaced. Mild right foraminal narrowing is seen. The left foramen is open. Bilateral facet arthropathy is present with secondary mild marrow edema in the right facets.   IMPRESSION: 1. Severe central canal stenosis at C3-4 where a central disc protrusion appears to be calcified. The cord is severely flattened and there is mild edema in the cord at this level. 2. Flattening of the ventral cord at C4-5 and C5-6. 3. Moderately severe to severe foraminal narrowing bilaterally at C3-4, C4-5 and C5-6. 4. Central and right paracentral protrusion at C7-T1 nearly effaces the ventral thecal sac. 5. Facet arthropathy at C3-4 and C7-T1 with secondary mild marrow edema in the facets.     Electronically Signed    By: Debby Prader M.D.   On: 04/03/2024 10:41     Objective:  VS:  HT:    WT:   BMI:     BP:(!) 183/94  HR: bpm  TEMP: ( )  RESP:  Physical Exam Vitals and nursing note reviewed.  Constitutional:      General: She is not in acute distress.    Appearance: Normal appearance. She is not ill-appearing.  HENT:     Head: Normocephalic and atraumatic.     Right Ear: External ear normal.     Left Ear: External ear normal.  Eyes:     Extraocular Movements: Extraocular movements intact.  Cardiovascular:     Rate and Rhythm: Normal rate.     Pulses: Normal pulses.  Musculoskeletal:     Cervical back: Tenderness present. No rigidity.     Right lower leg: No edema.     Left lower leg: No edema.     Comments: Patient has good strength in the upper extremities including 5 out of 5 strength in wrist extension long finger flexion and APB.  There is no atrophy of the hands intrinsically.  There is a negative Hoffmann's test.   Lymphadenopathy:     Cervical: No cervical adenopathy.  Skin:    Findings: No erythema, lesion or rash.  Neurological:     General: No focal deficit present.     Mental Status: She is alert and oriented to person, place, and time.     Sensory: No  sensory deficit.     Motor: No weakness or abnormal muscle tone.     Coordination: Coordination normal.  Psychiatric:        Mood and Affect: Mood normal.        Behavior: Behavior normal.      Imaging: No results found.

## 2024-05-25 NOTE — Procedures (Signed)
 Cervical Epidural Steroid Injection - Interlaminar Approach with Fluoroscopic Guidance  Patient: Danielle Pierce      Date of Birth: 06/06/1941 MRN: [id number] PCP: Caro Harlene POUR, NP      Visit Date: 05/21/2024   Universal Protocol:    Date/Time: 08/24/257:27 PM  Consent Given By: the patient  Position: PRONE  Additional Comments: Vital signs were monitored before and after the procedure. Patient was prepped and draped in the usual sterile fashion. The correct patient, procedure, and site was verified.   Injection Procedure Details:   Procedure diagnoses: Cervical radiculopathy [M54.12]    Meds Administered:  Meds ordered this encounter  Medications   methylPREDNISolone  acetate (DEPO-MEDROL ) injection 40 mg     Laterality: Left  Location/Site: C7-T1  Needle: 3.5 in., 20 ga. Tuohy  Needle Placement: Paramedian epidural space  Findings:  -Comments: Excellent flow of contrast into the epidural space.  Procedure Details: Using a paramedian approach from the side mentioned above, the region overlying the inferior lamina was localized under fluoroscopic visualization and the soft tissues overlying this structure were infiltrated with 4 ml. of 1% Lidocaine  without Epinephrine. A # 20 gauge, Tuohy needle was inserted into the epidural space using a paramedian approach.  The epidural space was localized using loss of resistance along with contralateral oblique bi-planar fluoroscopic views.  After negative aspirate for air, blood, and CSF, a 2 ml. volume of Isovue-250 was injected into the epidural space and the flow of contrast was observed. Radiographs were obtained for documentation purposes.   The injectate was administered into the level noted above.  Additional Comments:  The patient tolerated the procedure well Dressing: 2 x 2 sterile gauze and Band-Aid    Post-procedure details: Patient was observed during the procedure. Post-procedure instructions were  reviewed.  Patient left the clinic in stable condition.

## 2024-06-05 ENCOUNTER — Ambulatory Visit: Admitting: Orthopedic Surgery

## 2024-06-05 DIAGNOSIS — G5603 Carpal tunnel syndrome, bilateral upper limbs: Secondary | ICD-10-CM

## 2024-06-05 DIAGNOSIS — G5601 Carpal tunnel syndrome, right upper limb: Secondary | ICD-10-CM | POA: Diagnosis not present

## 2024-06-05 NOTE — Progress Notes (Signed)
 Orthopedic Spine Surgery Office Note   Assessment: Patient is a 83 y.o. female with two issues:   Cervical radiculopathy with neck pain that radiates into bilateral lateral arms which has improved with an injection Bilateral hand numbness and paresthesias concerning for carpal tunnel syndrome and EMG/NCS showed evidence of carpal tunnel syndrome     Plan: -Patient has tried PT -Since she has noticed significant improvement with her radicular type symptoms with the injection, could consider repeating that in the future if pain does return -Besides her difficulty with fine motor skills of the hand due to the numbness, she has no symptoms of myelopathy.  She also has no physical exam findings of myelopathy -EMG/NCS showed evidence of carpal tunnel syndrome which seems consistent with her symptoms.  Recommended wrist bracing which were given to her today.  She can see Dr. Erwin about possible surgical release for her carpal tunnel syndrome since it has been getting worse with time -Patient should return to office on an as needed basis     Patient expressed understanding of the plan and all questions were answered to the patient's satisfaction.    ___________________________________________________________________________     History:   Patient is a 83 y.o. female who presents today for follow-up on her cervical radiculopathy and bilateral hand numbness/paresthesias.  Patient states that she has gotten about 70% better in terms of the neck pain that radiated into her bilateral upper extremities.  She still has pain that radiates into the bilateral lateral arms.  However, pain is much more tolerable since that injection.  She still continues to notice numbness and paresthesias in the bilateral hands.  She said due to the numbness she has issues with using the hands.  She has not had any unsteadiness.  She did get her EMG/NCS done.     Treatments tried: PT, cervical ESI   Physical Exam:    General: no acute distress, appears stated age Neurologic: alert, answering questions appropriately, following commands Respiratory: unlabored breathing on room air, symmetric chest rise Psychiatric: appropriate affect, normal cadence to speech     MSK (spine):   -Strength exam                                                   Left                  Right Grip strength                5/5                  5/5 Interosseus                  5/5                  5/5 Wrist extension            5/5                  5/5 Wrist flexion                 5/5                  5/5 Elbow flexion                5/5  5/5 Deltoid                          5/5                  5/5   -Sensory exam                           Sensation intact to light touch in C5-T1 nerve distributions of bilateral upper extremities   -Brachioradialis DTR: 2/4 on the left, 2/4 on the right -Biceps DTR: 2/4 on the left, 2/4 on the right   -Spurling: Negative bilaterally -Hoffman sign: Negative bilaterally -Clonus: No beats bilaterally -Interosseous wasting: None seen -Grip and release test: Negative -Romberg: Negative -Gait: Normal   Imaging: XRs of the cervical spine from 04/20/2024 were previously independently reviewed and interpreted, showing disc height loss at multiple levels throughout the cervical spine.  Anterior osteophyte formation seen at C2/3, C3/4, C4/5, C5/6.  Vacuum disc phenomenon at C5/6.  No evidence of instability on flexion/extension views.  No fracture or dislocation seen.   MRI of the cervical spine from 03/24/2024 was previously independently reviewed and interpreted, showing DDD throughout the cervical spine.  Disc herniation at C3/4 resulting in central stenosis.  Bilateral foraminal stenosis at C3-4.  Bilateral foraminal stenosis at C4/5.  Right-sided foraminal stenosis at C5/6.  No T2 cord signal change seen.  EMG/NCS from 05/14/2024 that I ordered was reviewed and showed  evidence of bilateral carpal tunnel syndrome     Patient name: Danielle Pierce Patient MRN: [id number] Date of visit: 06/05/24

## 2024-06-06 ENCOUNTER — Other Ambulatory Visit: Payer: Self-pay | Admitting: Nurse Practitioner

## 2024-06-06 DIAGNOSIS — E785 Hyperlipidemia, unspecified: Secondary | ICD-10-CM

## 2024-06-16 ENCOUNTER — Ambulatory Visit: Admitting: Orthopedic Surgery

## 2024-06-16 ENCOUNTER — Encounter: Payer: Self-pay | Admitting: Orthopedic Surgery

## 2024-06-16 ENCOUNTER — Ambulatory Visit: Payer: Self-pay

## 2024-06-16 VITALS — BP 142/84 | HR 72 | Temp 98.2°F | Ht 64.0 in | Wt 243.8 lb

## 2024-06-16 DIAGNOSIS — I10 Essential (primary) hypertension: Secondary | ICD-10-CM | POA: Diagnosis not present

## 2024-06-16 DIAGNOSIS — E1122 Type 2 diabetes mellitus with diabetic chronic kidney disease: Secondary | ICD-10-CM

## 2024-06-16 DIAGNOSIS — N1831 Chronic kidney disease, stage 3a: Secondary | ICD-10-CM

## 2024-06-16 MED ORDER — AMLODIPINE BESYLATE 5 MG PO TABS: 5.0000 mg | ORAL_TABLET | Freq: Every day | ORAL | 1 refills | Status: DC

## 2024-06-16 NOTE — Progress Notes (Signed)
 Careteam: Patient Care Team: Caro Harlene POUR, NP as PCP - General (Geriatric Medicine) Anderson Maude ORN, MD (Inactive) as Consulting Physician (Orthopedic Surgery) Ivin Kocher, MD as Consulting Physician (Dermatology) Avram Lupita BRAVO, MD as Consulting Physician (Gastroenterology) Waylan Cain, MD as Consulting Physician (Ophthalmology)  Seen by: Greig Cluster, AGNP-C  PLACE OF SERVICE:  [facility]  Advanced Directive information    No Known Allergies  Chief Complaint  Patient presents with   Acute Visit    Pt stated since taking losartan  she has been feeling dizzy and unable to walk in a straight line. Pt last time taking losartan  was yesterday/ pt didn't take the medication today.      HPI: Patient is a 83 y.o. female seen today for acute visit due to dizziness.   Discussed the use of AI scribe software for clinical note transcription with the patient, who gave verbal consent to proceed.  History of Present Illness   Danielle Pierce is an 83 year old female with hypertension who presents with dizziness and gait disturbances.  She experiences dizziness and gait disturbances, describing her gait as 'off' and noting that she sometimes veers while walking. The onset of these symptoms coincided with a recent increase in her losartan  dosage to 100 mg, adjusted on April 22, 2024. She has been taking this increased dose since then, although she occasionally skips doses, feeling that it might be too high for her.  Dizziness is particularly noted when standing up, requiring her to pause before walking. No signs of blood loss, such as pink urine or black, tarry stools, are present. She acknowledges not drinking as much water as she should, which could contribute to her dizziness.  She reports that she did not take her medication on the day of the appointment. She previously switched from a medication that included a diuretic due to frequent urination, to losartan , which was  initially prescribed at 50 mg before being increased.  She is currently using Ozempic  for diabetes management and reports a weight decrease from 150 to 143 pounds. She emphasizes the importance of maintaining regular food intake to prevent symptoms like dizziness, which could be related to low blood sugar.        Review of Systems:  Review of Systems  Constitutional: Negative.   HENT: Negative.    Respiratory: Negative.    Cardiovascular: Negative.   Gastrointestinal:  Negative for blood in stool.  Genitourinary:  Negative for hematuria.  Musculoskeletal:  Positive for joint pain. Negative for falls.  Skin: Negative.   Neurological:  Positive for dizziness. Negative for headaches.  Psychiatric/Behavioral: Negative.      Past Medical History:  Diagnosis Date   Arthritis    Ankle   Asymptomatic varicose veins    Benign neoplasm of colon    Bilateral shoulder pain 07/02/14   Cataract    Bilateral - just watching   Cervicitis and endocervicitis 09/13/2011   Disorder of bone and cartilage, unspecified    Disorders of bursae and tendons in shoulder region, unspecified    External hemorrhoids without mention of complication    Fibrocystic breast    Generalized osteoarthrosis, unspecified site    GERD (gastroesophageal reflux disease)    diet controlled, No meds   Hiatal hernia    Hypertension    Impacted cerumen 11/07/2011   Obesity, unspecified    Other diseases of nasal cavity and sinuses(478.19)    Pain in joint, ankle and foot    Pain in joint,  pelvic region and thigh    Pain in joint, shoulder region    Reflux esophagitis    SVD (spontaneous vaginal delivery)    x 2   Unspecified essential hypertension    Unspecified vitamin D  deficiency    Past Surgical History:  Procedure Laterality Date   COLONOSCOPY  2006   Dr.Orr   COLONOSCOPY  07/08/2013   Dr. Avram   FLEXIBLE SIGMOIDOSCOPY  1990   Hemorrhoids    MOUTH SURGERY  2005   DR LUTINS - tooth ext and gum  surgery   SHOULDER ARTHROSCOPY W/ ACROMIAL REPAIR Right 07/02/14   Dr. Anderson   UPPER GASTROINTESTINAL ENDOSCOPY     normal   Social History:   reports that she has never smoked. She has never used smokeless tobacco. She reports current alcohol use. She reports that she does not use drugs.  Family History  Problem Relation Age of Onset   Hypertension Mother    Kidney disease Mother        Renal failure   Cancer Brother        Lymphoma   ADD / ADHD Son    Seizures Son    Hypertension Sister    Dementia Sister    Hypertension Sister    Hypertension Sister    Cancer - Other Sister    Early death Brother        Some type of accident   Colon cancer Father 41   Stomach cancer Neg Hx    Rectal cancer Neg Hx     Medications: Patient's Medications  New Prescriptions   No medications on file  Previous Medications   ALBUTEROL  (VENTOLIN  HFA) 108 (90 BASE) MCG/ACT INHALER    Inhale 2 puffs into the lungs every 6 (six) hours as needed for wheezing or shortness of breath.   ASPIRIN 81 MG TABLET    Take 81 mg by mouth daily.   ATORVASTATIN  (LIPITOR) 20 MG TABLET    TAKE ONE TABLET BY MOUTH AT BEDTIME   CALCIUM  CARBONATE-VITAMIN D  (CALCIUM -VITAMIN D ) 500-200 MG-UNIT PER TABLET    Take 1 tablet by mouth daily.   CARBOXYMETHYLCELL-HYPROMELLOSE 0.25-0.3 % GEL    Apply 1 application to eye daily. to alleviate irritation.   CHOLECALCIFEROL (VITAMIN D -3 PO)    Take 1 tablet by mouth daily.   GUAIFENESIN -DEXTROMETHORPHAN (ROBITUSSIN DM) 100-10 MG/5ML SYRUP    Take 10 mLs by mouth every 6 (six) hours as needed for cough.   HYDROCORTISONE  (ANUSOL -HC) 25 MG SUPPOSITORY    Place 1 suppository (25 mg total) rectally daily as needed for hemorrhoids or anal itching.   LOSARTAN  (COZAAR ) 100 MG TABLET    Take 1 tablet (100 mg total) by mouth daily.   PROTEIN POWD    Take 1 Scoop by mouth daily.   SEMAGLUTIDE ,0.25 OR 0.5MG /DOS, (OZEMPIC , 0.25 OR 0.5 MG/DOSE,) 2 MG/3ML SOPN    INJECT 0.25MG  INTO THE SKIN  ONCE A WEEK   VITAMIN B-12 (CYANOCOBALAMIN) 500 MCG TABLET    Take 500 mcg by mouth daily.   VITAMIN C (ASCORBIC ACID) 500 MG TABLET    Take 500 mg by mouth daily.   VITAMIN E 400 UNIT CAPSULE    Take 400 Units by mouth daily.  Modified Medications   No medications on file  Discontinued Medications   No medications on file    Physical Exam:  Vitals:   06/16/24 1440  BP: (!) 136/98  Pulse: 72  Temp: 98.2 F (36.8 C)  Weight: 243  lb 12.8 oz (110.6 kg)  Height: 5' 4 (1.626 m)   Body mass index is 41.85 kg/m. Wt Readings from Last 3 Encounters:  06/16/24 243 lb 12.8 oz (110.6 kg)  05/02/24 248 lb 9.6 oz (112.8 kg)  04/24/24 245 lb 3.2 oz (111.2 kg)    Physical Exam Vitals reviewed.  Constitutional:      General: She is not in acute distress.    Appearance: She is obese.  HENT:     Head: Normocephalic.  Eyes:     General:        Right eye: No discharge.        Left eye: No discharge.     Extraocular Movements:     Right eye: Normal extraocular motion and no nystagmus.     Left eye: Normal extraocular motion and no nystagmus.  Cardiovascular:     Rate and Rhythm: Normal rate and regular rhythm.     Pulses: Normal pulses.     Heart sounds: Normal heart sounds.  Pulmonary:     Effort: Pulmonary effort is normal.     Breath sounds: Normal breath sounds.  Abdominal:     Palpations: Abdomen is soft.  Musculoskeletal:     Cervical back: Neck supple.     Right lower leg: No edema.     Left lower leg: No edema.  Skin:    General: Skin is warm.     Capillary Refill: Capillary refill takes less than 2 seconds.  Neurological:     General: No focal deficit present.     Mental Status: She is alert and oriented to person, place, and time.  Psychiatric:        Mood and Affect: Mood normal.     Labs reviewed: Basic Metabolic Panel: Recent Labs    10/29/23 0928 01/31/24 1456 04/21/24 1413  NA 142 144 141  K 4.2 4.2 4.1  CL 105 108 105  CO2 27 30 28   GLUCOSE  102* 79 100*  BUN 13 12 13   CREATININE 1.08* 1.05* 1.04*  CALCIUM  9.5 9.5 9.5   Liver Function Tests: Recent Labs    10/29/23 0928 01/31/24 1456 04/21/24 1413  AST 22 23 25   ALT 15 14 17   BILITOT 0.7 0.7 0.7  PROT 6.7 6.3 6.5   No results for input(s): LIPASE, AMYLASE in the last 8760 hours. No results for input(s): AMMONIA in the last 8760 hours. CBC: Recent Labs    10/29/23 0928 01/31/24 1456 04/21/24 1413  WBC 7.5 9.0 7.7  NEUTROABS 3,600 5,031 4,474  HGB 13.3 12.9 13.5  HCT 40.9 39.6 40.8  MCV 93.8 93.0 94.9  PLT 188 208 192   Lipid Panel: Recent Labs    04/21/24 1413  CHOL 159  HDL 59  LDLCALC 83  TRIG 79  CHOLHDL 2.7   TSH: No results for input(s): TSH in the last 8760 hours. A1C: Lab Results  Component Value Date   HGBA1C 6.1 (H) 04/21/2024     Assessment/Plan 1. Essential hypertension (Primary) - uncontrolled, goal < 150/90 - 07/22 losartan  increased to 100 mg> dizziness reported as SE - will try amlodipine  5 mg for HTN and kidney protection  - check blood pressures BID x 2 weeks - follow up with PCP in 2 weeks  - amLODipine  (NORVASC ) 5 MG tablet; Take 1 tablet (5 mg total) by mouth daily.  Dispense: 90 tablet; Refill: 1  2. Type 2 diabetes mellitus with stage 3a chronic kidney disease, without long-term current use  of insulin (HCC) - see above - recent A1c 6.1 04/21/2024 - no hypoglycemias - cont Ozempic   Total time: 31 minutes. Greater than 50% of total time spent doing patient education regarding dizziness, HTN, T2DM including symptom/medication management.     Next appt: 07/03/2024  Greig Cluster, ELNITA  [facility] [phone]

## 2024-06-16 NOTE — Telephone Encounter (Signed)
 FYI Only or Action Required?: FYI only for provider.  Patient was last seen in primary care on 05/02/2024 by Danielle Harlene POUR, NP.  Called Nurse Triage reporting Dizziness.  Symptoms began several weeks ago.  Interventions attempted: Nothing.  Symptoms are: unchanged.  Triage Disposition: See Physician Within 24 Hours  Patient/caregiver understands and will follow disposition?: Yes   Copied from CRM #[id number]. Topic: Clinical - Red Word Triage >> Jun 16, 2024  1:20 PM Miquel SAILOR wrote: Red Word that prompted transfer to Nurse Triage: losartan  (COZAAR ) 100 MG tablet-Making Patient feel can not walk straight/Dizziness since she been taking this. Reason for Disposition  [1] NO dizziness now AND [2] one or more stroke risk factors (i.e., hypertension, diabetes, prior stroke/TIA/heart attack)  Answer Assessment - Initial Assessment Questions 1. DESCRIPTION: Describe your dizziness.     Walking off to the side  2. VERTIGO: Do you feel like either you or the room is spinning or tilting?      Tilting/walking off to the side 3. LIGHTHEADED: Do you feel lightheaded? (e.g., somewhat faint, woozy, weak upon standing)     No 4. SEVERITY: How bad is it?  Can you walk?     Can walk  5. ONSET:  When did the dizziness begin?     Since increase losartan  dose about 2-4 weeks ago 6. AGGRAVATING FACTORS: Does anything make it worse? (e.g., standing, change in head position)     walking 7. CAUSE: What do you think is causing the dizziness?     losartan  8. RECURRENT SYMPTOM: Have you had dizziness before? If Yes, ask: When was the last time? What happened that time?      9. OTHER SYMPTOMS: Do you have any other symptoms? (e.g., earache, headache, numbness, tinnitus, vomiting, weakness)     Denies  Protocols used: Dizziness - Vertigo-A-AH

## 2024-06-16 NOTE — Patient Instructions (Signed)
 Stop losartan   Start amlodipine  5 mg every day  Take blood pressures twice daily > 1 hour after taking amlodipine  and once in evening   Please bring blood pressure readings next visit

## 2024-06-27 ENCOUNTER — Encounter (HOSPITAL_COMMUNITY): Payer: Self-pay

## 2024-06-27 ENCOUNTER — Ambulatory Visit (HOSPITAL_COMMUNITY)
Admission: EM | Admit: 2024-06-27 | Discharge: 2024-06-27 | Disposition: A | Discharge status: Home / Self Care | Attending: Internal Medicine | Admitting: Internal Medicine

## 2024-06-27 DIAGNOSIS — R8290 Unspecified abnormal findings in urine: Secondary | ICD-10-CM | POA: Diagnosis not present

## 2024-06-27 DIAGNOSIS — I10 Essential (primary) hypertension: Secondary | ICD-10-CM | POA: Diagnosis not present

## 2024-06-27 LAB — CBC
HCT: 39.8 % (ref 36.0–46.0)
Hemoglobin: 13.4 g/dL (ref 12.0–15.0)
MCH: 30.9 pg (ref 26.0–34.0)
MCHC: 33.7 g/dL (ref 30.0–36.0)
MCV: 91.9 fL (ref 80.0–100.0)
Platelets: 220 K/uL (ref 150–400)
RBC: 4.33 MIL/uL (ref 3.87–5.11)
RDW: 14.6 % (ref 11.5–15.5)
WBC: 9 K/uL (ref 4.0–10.5)
nRBC: 0 % (ref 0.0–0.2)

## 2024-06-27 LAB — POCT URINALYSIS DIP (MANUAL ENTRY)
Bilirubin, UA: NEGATIVE
Blood, UA: NEGATIVE
Glucose, UA: NEGATIVE mg/dL
Ketones, POC UA: NEGATIVE mg/dL
Leukocytes, UA: NEGATIVE
Nitrite, UA: NEGATIVE
Protein Ur, POC: 30 mg/dL — AB
Spec Grav, UA: 1.015 (ref 1.010–1.025)
Urobilinogen, UA: 0.2 U/dL
pH, UA: 6 (ref 5.0–8.0)

## 2024-06-27 LAB — COMPREHENSIVE METABOLIC PANEL WITH GFR
ALT: 15 U/L (ref 0–44)
AST: 29 U/L (ref 15–41)
Albumin: 3.9 g/dL (ref 3.5–5.0)
Alkaline Phosphatase: 92 U/L (ref 38–126)
Anion gap: 8 (ref 5–15)
BUN: 8 mg/dL (ref 8–23)
CO2: 23 mmol/L (ref 22–32)
Calcium: 9.1 mg/dL (ref 8.9–10.3)
Chloride: 108 mmol/L (ref 98–111)
Creatinine, Ser: 0.89 mg/dL (ref 0.44–1.00)
GFR, Estimated: >60 mL/min (ref >60)
Glucose, Bld: 93 mg/dL (ref 70–99)
Potassium: 3.5 mmol/L (ref 3.5–5.1)
Sodium: 139 mmol/L (ref 135–145)
Total Bilirubin: 0.7 mg/dL (ref 0.0–1.2)
Total Protein: 6.6 g/dL (ref 6.5–8.1)

## 2024-06-27 NOTE — ED Provider Notes (Signed)
 [facility]    CSN: [id number] Arrival date & time: 06/27/24  1745      History   Chief Complaint Chief Complaint  Patient presents with   Hypertension    HPI Danielle Pierce is a 83 y.o. female presents with concerned that her BP gradually is getting higher in the past 2-3 weeks. Her readings have gone up to 170's/ 113. She has been dealing with bilateral  carpal tunnel and is due to have surgery in 3 days. She has more tingling in her hands than pain. She is sleeping well. She denies CP or SOB. Her PCP changed her BP med from Losartan / hydrochlorothiazide  to just plain losartan  due to her feeling off balance when she saw her in July. Then on 9/15 the losartan  was d/c and placed on Amlodipine  5 mg every day which she has been taking. She denies more edema than usual on her legs. Denies HA or ringing in her ears.  Her BP usually rans 120's / 80's   Past Medical History:  Diagnosis Date   Arthritis    Ankle   Asymptomatic varicose veins    Benign neoplasm of colon    Bilateral shoulder pain 07/02/14   Cataract    Bilateral - just watching   Cervicitis and endocervicitis 09/13/2011   Disorder of bone and cartilage, unspecified    Disorders of bursae and tendons in shoulder region, unspecified    External hemorrhoids without mention of complication    Fibrocystic breast    Generalized osteoarthrosis, unspecified site    GERD (gastroesophageal reflux disease)    diet controlled, No meds   Hiatal hernia    Hypertension    Impacted cerumen 11/07/2011   Obesity, unspecified    Other diseases of nasal cavity and sinuses(478.19)    Pain in joint, ankle and foot    Pain in joint, pelvic region and thigh    Pain in joint, shoulder region    Reflux esophagitis    SVD (spontaneous vaginal delivery)    x 2   Unspecified essential hypertension    Unspecified vitamin D  deficiency     Patient Active Problem List   Diagnosis Date Noted   Stage 3a chronic kidney  disease (HCC) 05/02/2024   Osteoarthritis of spine with radiculopathy, cervical region 05/02/2024   Osteopenia 05/02/2024   Radiculopathy, cervical region 03/13/2024   Trigger thumb, right thumb 07/27/2020   Body mass index (BMI) of 40.1-44.9 in adult (HCC) 11/14/2018   Localized osteoarthritis of left knee 05/10/2017   HLD (hyperlipidemia) 01/19/2016   Lumbago 04/08/2014   Internal and external bleeding hemorrhoids 07/08/2013   DM (diabetes mellitus), type 2 with renal complications (HCC) 02/20/2013   Essential hypertension 02/20/2013   Obesity    Vitamin D  deficiency     Past Surgical History:  Procedure Laterality Date   COLONOSCOPY  2006   Dr.Orr   COLONOSCOPY  07/08/2013   Dr. Avram   FLEXIBLE SIGMOIDOSCOPY  1990   Hemorrhoids    MOUTH SURGERY  2005   DR LUTINS - tooth ext and gum surgery   SHOULDER ARTHROSCOPY W/ ACROMIAL REPAIR Right 07/02/14   Dr. Anderson   UPPER GASTROINTESTINAL ENDOSCOPY     normal    OB History   No obstetric history on file.      Home Medications    Prior to Admission medications   Medication Sig Start Date End Date Taking? Authorizing Provider  albuterol  (VENTOLIN  HFA) 108 (90 Base) MCG/ACT inhaler Inhale  2 puffs into the lungs every 6 (six) hours as needed for wheezing or shortness of breath. 09/14/23  Yes Medina-Vargas, Monina C, NP  amLODipine  (NORVASC ) 5 MG tablet Take 1 tablet (5 mg total) by mouth daily. 06/16/24  Yes Fargo, Amy E, NP  aspirin 81 MG tablet Take 81 mg by mouth daily.   Yes [provider]  atorvastatin  (LIPITOR) 20 MG tablet TAKE ONE TABLET BY MOUTH AT BEDTIME 06/09/24  Yes Eubanks, Jessica K, NP  Calcium  Carbonate-Vitamin D  (CALCIUM -VITAMIN D ) 500-200 MG-UNIT per tablet Take 1 tablet by mouth daily.   Yes [provider]  Carboxymethylcell-Hypromellose 0.25-0.3 % GEL Apply 1 application to eye daily. to alleviate irritation.   Yes [provider]  Cholecalciferol (VITAMIN D -3 PO) Take 1  tablet by mouth daily.   Yes [provider]  guaiFENesin -dextromethorphan (ROBITUSSIN DM) 100-10 MG/5ML syrup Take 10 mLs by mouth every 6 (six) hours as needed for cough. 09/14/23  Yes Medina-Vargas, Monina C, NP  hydrocortisone  (ANUSOL -HC) 25 MG suppository Place 1 suppository (25 mg total) rectally daily as needed for hemorrhoids or anal itching. 06/17/21  Yes Caro Harlene POUR, NP  Protein POWD Take 1 Scoop by mouth daily.   Yes [provider]  Semaglutide ,0.25 or 0.5MG /DOS, (OZEMPIC , 0.25 OR 0.5 MG/DOSE,) 2 MG/3ML SOPN INJECT 0.25MG  INTO THE SKIN ONCE A WEEK 05/02/24  Yes Eubanks, Jessica K, NP  vitamin B-12 (CYANOCOBALAMIN) 500 MCG tablet Take 500 mcg by mouth daily.   Yes [provider]  vitamin C (ASCORBIC ACID) 500 MG tablet Take 500 mg by mouth daily.   Yes [provider]  vitamin E 400 UNIT capsule Take 400 Units by mouth daily.   Yes [provider]    Family History Family History  Problem Relation Age of Onset   Hypertension Mother    Kidney disease Mother        Renal failure   Cancer Brother        Lymphoma   ADD / ADHD Son    Seizures Son    Hypertension Sister    Dementia Sister    Hypertension Sister    Hypertension Sister    Cancer - Other Sister    Early death Brother        Some type of accident   Colon cancer Father 60   Stomach cancer Neg Hx    Rectal cancer Neg Hx     Social History Social History   Tobacco Use   Smoking status: Never   Smokeless tobacco: Never  Vaping Use   Vaping status: Never Used  Substance Use Topics   Alcohol use: Yes    Comment: occasional mixed drink    Drug use: No     Allergies   Patient has no known allergies.   Review of Systems Review of Systems As noted in HPI  Physical Exam Triage Vital Signs ED Triage Vitals  Encounter Vitals Group     BP 06/27/24 1828 (!) 161/88     Girls Systolic BP Percentile --      Girls Diastolic BP Percentile --      Boys  Systolic BP Percentile --      Boys Diastolic BP Percentile --      Pulse Rate 06/27/24 1828 73     Resp 06/27/24 1828 18     Temp 06/27/24 1828 98.6 F (37 C)     Temp Source 06/27/24 1828 Oral     SpO2 06/27/24 1828 96 %  Weight --      Height --      Head Circumference --      Peak Flow --      Pain Score 06/27/24 1826 10     Pain Loc --      Pain Education --      Exclude from Growth Chart --    No data found.  Updated Vital Signs BP (!) 161/88 (BP Location: Left Arm)   Pulse 73   Temp 98.6 F (37 C) (Oral)   Resp 18   SpO2 96%   Visual Acuity Right Eye Distance:   Left Eye Distance:   Bilateral Distance:    Right Eye Near:   Left Eye Near:    Bilateral Near:     Physical Exam Vitals and nursing note reviewed.  Constitutional:      General: She is not in acute distress.    Appearance: She is obese. She is not toxic-appearing.  HENT:     Right Ear: Tympanic membrane, ear canal and external ear normal.     Left Ear: Tympanic membrane, ear canal and external ear normal.  Eyes:     General: No scleral icterus.    Conjunctiva/sclera: Conjunctivae normal.  Cardiovascular:     Rate and Rhythm: Normal rate and regular rhythm.     Heart sounds: No murmur heard. Pulmonary:     Effort: Pulmonary effort is normal.     Breath sounds: Normal breath sounds.  Musculoskeletal:     Cervical back: Neck supple.     Right lower leg: No edema.     Left lower leg: Edema present.     Comments: + 1/4 edema of ankle noted  Skin:    General: Skin is warm and dry.  Neurological:     Mental Status: She is alert and oriented to person, place, and time.     Gait: Gait normal.     Comments: She walks normal  Psychiatric:        Mood and Affect: Mood normal.        Behavior: Behavior normal.        Thought Content: Thought content normal.        Judgment: Judgment normal.      UC Treatments / Results  Labs (all labs ordered are listed, but only abnormal results are  displayed) Labs Reviewed  POCT URINALYSIS DIP (MANUAL ENTRY) - Abnormal; Notable for the following components:      Result Value   Clarity, UA cloudy (*)    Protein Ur, POC =30 (*)    All other components within normal limits  COMPREHENSIVE METABOLIC PANEL WITH GFR  CBC  UA negative   EKG NSR Normal EKG  Radiology No results found.  Procedures Procedures (including critical care time)  Medications Ordered in UC Medications - No data to display  Initial Impression / Assessment and Plan / UC Course  I have reviewed the triage vital signs and the nursing notes.  Pertinent labs & imaging results that were available during my care of the patient were reviewed by me and considered in my medical decision making (see chart for details).  Uncontrolled HTN Feeling off balance for a month off and on and her UA is negative for UTI. Since she has not had labs since July I ordered CMP and CBC and we will inform her of the results wen is back.  In the mean time I advised her to increase her Amlodipine  to 7.5 mg  every day for the weekend.    Final Clinical Impressions(s) / UC Diagnoses   Final diagnoses:  Cloudy urine  Primary hypertension     Discharge Instructions      Your EKG is normal Your urine does not show signs of infection. Increase your amlodipine  to 7.5 mg every morning.  Call your primary care doctor on Monday to make a follow up appointment.  I am ordering blood work to check your electrolytes and we will inform you of the results tomorrow.      ED Prescriptions   None    PDMP not reviewed this encounter.   Lindi Carter, DEVONNA 06/27/24 2040

## 2024-06-27 NOTE — ED Triage Notes (Signed)
 Scheduled for carpel tunnel surgery this coming Monday. States the pain is getting worse and she noticed that her blood pressure has been high. Ongoing since 2-3 weeks ago and getting higher than it normally is. Patient has also noticed a difference in her gait.

## 2024-06-27 NOTE — Discharge Instructions (Addendum)
 Your EKG is normal Your urine does not show signs of infection. Increase your amlodipine  to 7.5 mg every morning.  Call your primary care doctor on Monday to make a follow up appointment.  I am ordering blood work to check your electrolytes and we will inform you of the results tomorrow.

## 2024-06-30 ENCOUNTER — Ambulatory Visit (HOSPITAL_COMMUNITY): Payer: Self-pay

## 2024-06-30 ENCOUNTER — Ambulatory Visit: Admitting: Orthopedic Surgery

## 2024-06-30 DIAGNOSIS — G5603 Carpal tunnel syndrome, bilateral upper limbs: Secondary | ICD-10-CM | POA: Diagnosis not present

## 2024-06-30 NOTE — Addendum Note (Signed)
 Addended by: Janece Laidlaw G on: 06/30/2024 10:50 AM   Modules accepted: Orders

## 2024-06-30 NOTE — Progress Notes (Signed)
 Danielle Pierce - 83 y.o. female MRN [id number]  Date of birth: 1941-08-07  Office Visit Note: Visit Date: 06/30/2024 PCP: Caro Harlene POUR, NP Referred by: Caro Harlene POUR, NP  Subjective: No chief complaint on file.  HPI: Danielle Pierce is a pleasant 83 y.o. female who presents today for bilateral carpal tunnel syndrome. The right hand is worse than the left. She has obtained an EMG on 05/14/24 which does show median nerve entrapment bilaterally.  Has ongoing nocturnal symptoms, the numbness and pain is beginning to affect her activities of daily living significantly.  She has been referred to me by Dr. Georgina today for specific hand surgical evaluation.  Pertinent ROS were reviewed with the patient and found to be negative unless otherwise specified above in HPI.   Visit Reason: bilateral hands Duration of symptoms: 6+ months Hand dominance: right Occupation: retired Diabetic: Yes/ 6.1 Smoking: No Heart/Lung History: none Blood Thinners:  baby aspirin  Prior Testing/EMG: EMG 05/14/24     Assessment & Plan: Visit Diagnoses:  1. Carpal tunnel syndrome, bilateral     Plan: Extensive discussion was had with the patient today about her ongoing bilateral carpal tunnel syndrome that is refractory to conservative care.  Patient has both clinical and electrodiagnostic evidence to confirm this diagnosis.  At this juncture, she is indicated for bilateral, staged open versus endoscopic carpal tunnel release.  Risks and benefits of both operations were discussed in detail today.  She would like to begin with the right side.  Understanding all risks and benefits, patient would like to have surgery done in the form of right open carpal tunnel release under local anesthesia.  Risks include but not limited to infection, bleeding, scarring, stiffness, nerve injury or vascular, tendon injury, risk of recurrence and need for subsequent operation were all discussed in detail.  Patient  consented understanding the above.  Will move forward surgical scheduling.  Procedure will be performed this week.   Follow-up: No follow-ups on file.   Meds & Orders: No orders of the defined types were placed in this encounter.  No orders of the defined types were placed in this encounter.    Procedures: No procedures performed      Clinical History: Narrative & Impression CLINICAL DATA:  Weakness in the neck, shoulders and hands for approximately 4 weeks. No known injury.   EXAM: MRI CERVICAL SPINE WITHOUT CONTRAST   TECHNIQUE: Multiplanar, multisequence MR imaging of the cervical spine was performed. No intravenous contrast was administered.   COMPARISON:  Plain film cervical spine 02/08/2024.   FINDINGS: Alignment: There is straightening of the normal cervical lordosis and trace anterolisthesis C7 on T1.   Vertebrae: No fracture, evidence of discitis, or bone lesion.   Cord: Mild, hazy edema is seen within the cord at the C3-4 level.   Posterior Fossa, vertebral arteries, paraspinal tissues: Negative.   Disc levels:   C2-3: Small central disc protrusion and mild to moderate facet degenerative change. The central canal and foramina remain open.   C3-4: Disc osteophyte complex with a superimposed central disc protrusion which appears to be calcified. The cord is severely flattened. There is moderately severe to severe bilateral foraminal narrowing, worse on the left. Facet arthropathy is also worse on the left with mild marrow edema seen in the left facets.   C4-5: Disc osteophyte complex and uncovertebral spurring. The ventral cord is flattened. Moderately severe to severe foraminal narrowing is worse on the left.   C5-6: Disc osteophyte complex  flattens the ventral cord. Uncovertebral and facet arthropathy cause moderately severe to severe foraminal narrowing, worse on the right.   C6-7: Shallow disc bulge and uncovertebral spurring. The central canal is  open. Mild bilateral foraminal narrowing is present.   C7-T1: The disc is uncovered with a superimposed central and right paracentral protrusion. The ventral thecal sac is nearly effaced. Mild right foraminal narrowing is seen. The left foramen is open. Bilateral facet arthropathy is present with secondary mild marrow edema in the right facets.   IMPRESSION: 1. Severe central canal stenosis at C3-4 where a central disc protrusion appears to be calcified. The cord is severely flattened and there is mild edema in the cord at this level. 2. Flattening of the ventral cord at C4-5 and C5-6. 3. Moderately severe to severe foraminal narrowing bilaterally at C3-4, C4-5 and C5-6. 4. Central and right paracentral protrusion at C7-T1 nearly effaces the ventral thecal sac. 5. Facet arthropathy at C3-4 and C7-T1 with secondary mild marrow edema in the facets.     Electronically Signed   By: Debby Prader M.D.   On: 04/03/2024 10:41  She reports that she has never smoked. She has never used smokeless tobacco.  Recent Labs    10/29/23 0928 04/21/24 1413  HGBA1C 6.3* 6.1*    Objective:   Vital Signs: There were no vitals taken for this visit.  Physical Exam  Gen: Well-appearing, in no acute distress; non-toxic CV: Regular Rate. Well-perfused. Warm.  Resp: Breathing unlabored on room air; no wheezing. Psych: Fluid speech in conversation; appropriate affect; normal thought process  Ortho Exam PHYSICAL EXAM:  General: Patient is well appearing and in no distress.   Skin and Muscle: No significant skin changes are apparent to upper extremities.   Range of Motion and Palpation Tests: Mobility is full about the elbows with flexion and extension. Forearm supination and pronation are 85/85 bilaterally.  Wrist flexion/extension is 75/65 bilaterally.  Digital flexion and extension are full.  Thumb opposition is full to the base of the small fingers bilaterally.    No cords or nodules  are palpated.  No triggering is observed.     Neurologic, Vascular, Motor: Sensation is diminished to light touch in the bilateral median nerve distribution.    Thenar atrophy: Positive bilateral Tinel sign: Positive bilateral carpal tunnel Carpal tunnel compression: Positive bilateral Phalen test: Positive bilateral  Motor bilateral hand FPL: 5/5 Index FDP: 5/5 APB: 4/5 bilaterally   Fingers pink and well perfused.  Capillary refill is brisk.     Lab Results  Component Value Date   HGBA1C 6.1 (H) 04/21/2024     Imaging: No results found.  Past Medical/Family/Surgical/Social History: Medications & Allergies reviewed per EMR, new medications updated. Patient Active Problem List   Diagnosis Date Noted   Stage 3a chronic kidney disease (HCC) 05/02/2024   Osteoarthritis of spine with radiculopathy, cervical region 05/02/2024   Osteopenia 05/02/2024   Radiculopathy, cervical region 03/13/2024   Trigger thumb, right thumb 07/27/2020   Body mass index (BMI) of 40.1-44.9 in adult [facility]) 11/14/2018   Localized osteoarthritis of left knee 05/10/2017   HLD (hyperlipidemia) 01/19/2016   Lumbago 04/08/2014   Internal and external bleeding hemorrhoids 07/08/2013   DM (diabetes mellitus), type 2 with renal complications (HCC) 02/20/2013   Essential hypertension 02/20/2013   Obesity    Vitamin D  deficiency    Past Medical History:  Diagnosis Date   Arthritis    Ankle   Asymptomatic varicose veins  Benign neoplasm of colon    Bilateral shoulder pain 07/02/14   Cataract    Bilateral - just watching   Cervicitis and endocervicitis 09/13/2011   Disorder of bone and cartilage, unspecified    Disorders of bursae and tendons in shoulder region, unspecified    External hemorrhoids without mention of complication    Fibrocystic breast    Generalized osteoarthrosis, unspecified site    GERD (gastroesophageal reflux disease)    diet controlled, No meds   Hiatal hernia     Hypertension    Impacted cerumen 11/07/2011   Obesity, unspecified    Other diseases of nasal cavity and sinuses(478.19)    Pain in joint, ankle and foot    Pain in joint, pelvic region and thigh    Pain in joint, shoulder region    Reflux esophagitis    SVD (spontaneous vaginal delivery)    x 2   Unspecified essential hypertension    Unspecified vitamin D  deficiency    Family History  Problem Relation Age of Onset   Hypertension Mother    Kidney disease Mother        Renal failure   Cancer Brother        Lymphoma   ADD / ADHD Son    Seizures Son    Hypertension Sister    Dementia Sister    Hypertension Sister    Hypertension Sister    Cancer - Other Sister    Early death Brother        Some type of accident   Colon cancer Father 71   Stomach cancer Neg Hx    Rectal cancer Neg Hx    Past Surgical History:  Procedure Laterality Date   COLONOSCOPY  2006   Dr.Orr   COLONOSCOPY  07/08/2013   Dr. Avram   FLEXIBLE SIGMOIDOSCOPY  1990   Hemorrhoids    MOUTH SURGERY  2005   DR LUTINS - tooth ext and gum surgery   SHOULDER ARTHROSCOPY W/ ACROMIAL REPAIR Right 07/02/14   Dr. Anderson   UPPER GASTROINTESTINAL ENDOSCOPY     normal   Social History   Occupational History   Occupation: retired Camera operator  Tobacco Use   Smoking status: Never   Smokeless tobacco: Never  Vaping Use   Vaping status: Never Used  Substance and Sexual Activity   Alcohol use: Yes    Comment: occasional mixed drink    Drug use: No   Sexual activity: Not Currently    Birth control/protection: Post-menopausal    Maycen Degregory Estela) Kebra Lowrimore, M.D. [facility], Hand Surgery

## 2024-07-02 ENCOUNTER — Other Ambulatory Visit: Payer: Self-pay | Admitting: Orthopedic Surgery

## 2024-07-02 HISTORY — PX: CARPAL TUNNEL RELEASE: SHX101

## 2024-07-02 MED ORDER — ACETAMINOPHEN-CODEINE 300-30 MG PO TABS: 1.0000 | ORAL_TABLET | Freq: Four times a day (QID) | ORAL | 0 refills | Status: DC | PRN

## 2024-07-03 ENCOUNTER — Ambulatory Visit: Admitting: Orthopedic Surgery

## 2024-07-03 DIAGNOSIS — G5601 Carpal tunnel syndrome, right upper limb: Secondary | ICD-10-CM | POA: Diagnosis not present

## 2024-07-09 ENCOUNTER — Other Ambulatory Visit: Payer: Self-pay | Admitting: Orthopedic Surgery

## 2024-07-11 ENCOUNTER — Ambulatory Visit: Payer: Self-pay

## 2024-07-11 NOTE — Telephone Encounter (Signed)
 FYI Only or Action Required?: Action required by provider: Medication Request.  Patient was last seen in primary care on 06/16/2024 by Gil Greig BRAVO, NP.  Called Nurse Triage reporting Constipation.  Symptoms began yesterday.  Symptoms are: unchanged.  Triage Disposition: Home Care  Patient/caregiver understands and will follow disposition?: Yes      Copied from CRM [id number]. Topic: Clinical - Medication Question >> Jul 11, 2024  1:03 PM Mercer PEDLAR wrote: Reason for CRM: Patient stated that she is having constipation and is requesting a prescription to help her.   [facility] [place], [place] - [address] [facility] [address] [place] [address] Phone: [phone] Fax: [phone]        Reason for Disposition  MILD constipation  Answer Assessment - Initial Assessment Questions Patient is requesting medication to help with her constipation. Please advise.       1. STOOL PATTERN OR FREQUENCY: How often do you have a bowel movement (BM)?  (Normal range: 3 times a day to every 3 days)  When was your last BM?       Small amount last night and this morning, but only small amount  2. STRAINING: Do you have to strain to have a BM?      Yes  3. ONSET: When did the constipation begin?     Yesterday  4. RECTAL PAIN: Does your rectum hurt when the stool comes out? If Yes, ask: Do you have hemorrhoids? How bad is the pain?  (Scale 1-10; or mild, moderate, severe)     No 5. BM COMPOSITION: Are the stools hard?      Hard  6. BLOOD ON STOOLS: Has there been any blood on the toilet tissue or on the surface of the BM? If Yes, ask: When was the last time?     No 7. CHRONIC CONSTIPATION: Is this a new problem for you?  If No, ask: How long have you had this problem? (days, weeks, months)      Started yesterday  8. CHANGES IN DIET OR HYDRATION: Have there been any recent changes in your diet? How much fluids are you  drinking on a daily basis?  How much have you had to drink today?     No 9. MEDICINES: Have you been taking any new medicines? Are you taking any narcotic pain medicines? (e.g., Dilaudid, morphine, Percocet, Vicodin)     Yes, Tylenol with Codeine  10. LAXATIVES: Have you been using any stool softeners, laxatives, or enemas?  If Yes, ask What are you using, how often, and when was the last time?       No 11. ACTIVITY:  How much walking do you do every day?  Has your activity level decreased in the past week?        No 12. CAUSE: What do you think is causing the constipation?        Recent carpal tunnel surgery and is on Tylenol with Codeine  13. MEDICAL HISTORY: Do you have a history of hemorrhoids, rectal fissures, rectal surgery, or rectal abscess?         No 14. OTHER SYMPTOMS: Do you have any other symptoms? (e.g., abdomen pain, bloating, fever, vomiting)       No  Protocols used: Constipation-A-AH

## 2024-07-12 ENCOUNTER — Encounter: Payer: Self-pay | Admitting: Adult Health

## 2024-07-12 MED ORDER — POLYETHYLENE GLYCOL 3350 17 GM/SCOOP PO POWD: 17.0000 g | Freq: Every day | ORAL | 1 refills | Status: AC

## 2024-07-14 NOTE — Telephone Encounter (Signed)
 Danielle Maus, NP to Danielle Pierce, CMA  [facility] (Selected Message)     07/12/24 10:01 AM Prescription for miralax sent

## 2024-07-14 NOTE — Telephone Encounter (Signed)
 Noted

## 2024-07-16 NOTE — Therapy (Signed)
 OUTPATIENT OCCUPATIONAL THERAPY ORTHO EVALUATION AND DISCHARGE NOTE  Patient Name: Danielle Pierce MRN: [id number] DOB:October 06, 1940, 83 y.o., female Today's Date: 07/17/2024  REFERRING PROVIDER: Arlinda Buster, MD   END OF SESSION:   Past Medical History:  Diagnosis Date   Arthritis    Ankle   Asymptomatic varicose veins    Benign neoplasm of colon    Bilateral shoulder pain 07/02/14   Cataract    Bilateral - just watching   Cervicitis and endocervicitis 09/13/2011   Disorder of bone and cartilage, unspecified    Disorders of bursae and tendons in shoulder region, unspecified    External hemorrhoids without mention of complication    Fibrocystic breast    Generalized osteoarthrosis, unspecified site    GERD (gastroesophageal reflux disease)    diet controlled, No meds   Hiatal hernia    Hypertension    Impacted cerumen 11/07/2011   Obesity, unspecified    Other diseases of nasal cavity and sinuses(478.19)    Pain in joint, ankle and foot    Pain in joint, pelvic region and thigh    Pain in joint, shoulder region    Reflux esophagitis    SVD (spontaneous vaginal delivery)    x 2   Unspecified essential hypertension    Unspecified vitamin D  deficiency    Past Surgical History:  Procedure Laterality Date   COLONOSCOPY  2006   Dr.Orr   COLONOSCOPY  07/08/2013   Dr. Avram   FLEXIBLE SIGMOIDOSCOPY  1990   Hemorrhoids    MOUTH SURGERY  2005   DR LUTINS - tooth ext and gum surgery   SHOULDER ARTHROSCOPY W/ ACROMIAL REPAIR Right 07/02/14   Dr. Anderson   UPPER GASTROINTESTINAL ENDOSCOPY     normal   Patient Active Problem List   Diagnosis Date Noted   Stage 3a chronic kidney disease (HCC) 05/02/2024   Osteoarthritis of spine with radiculopathy, cervical region 05/02/2024   Osteopenia 05/02/2024   Radiculopathy, cervical region 03/13/2024   Trigger thumb, right thumb 07/27/2020   Body mass index (BMI) of 40.1-44.9 in adult (HCC) 11/14/2018   Localized  osteoarthritis of left knee 05/10/2017   HLD (hyperlipidemia) 01/19/2016   Lumbago 04/08/2014   Internal and external bleeding hemorrhoids 07/08/2013   DM (diabetes mellitus), type 2 with renal complications (HCC) 02/20/2013   Essential hypertension 02/20/2013   Obesity    Vitamin D  deficiency      ONSET DATE:  DOS 07/03/24  REFERRING DIAG: G56.03 (ICD-10-CM) - Carpal tunnel syndrome, bilateral   THERAPY DIAG:     Localized edema  Muscle weakness (generalized)  Pain in right hand  Rationale for Evaluation and Treatment: Rehabilitation  SUBJECTIVE:   SUBJECTIVE STATEMENT: The patient states hx of paresthesia and pain in their hand and subsequent surgical release of the carpal tunnel. Now the patient states having some lingering paresthesia, stiffness, pain, decreased ability to make a fist and perform I/ADLs.     PERTINENT HISTORY: The patient is now approx 2 weeks s/p Rt hand CTR.   PRECAUTIONS: None relative to this evaluation and episode of care.   RED FLAGS: None   WEIGHT BEARING RESTRICTIONS: Yes: caution with weightbearing for the next 4-6 weeks, recommended less than 5lbs for next 2 weeks with affected hand  PAIN:  Are you having pain? Yes: NPRS scale: between mild and moderate now at rest  Pain location:  sx area Pain description: aching and sore Aggravating factors: gripping/squeezing Relieving factors: rest  FALLS: Has patient fallen in last  6 months? No, not a fall risk  PLOF: Independent with I/ADLs  PATIENT GOALS: To improve motion, function with affected surgical hand  NEXT MD VISIT: PRN    OBJECTIVE MEASURES:   ADLs: Overall ADLs: States decreased ability to grab, hold household objects, pain and difficulty to open containers, perform FMS tasks (manipulate fasteners on clothing).     UPPER EXTREMITY ROM:     A/ROM Right eval  Wrist flexion 56  Wrist extension 44  (Blank rows = not tested)                    Hand A/ROM Right eval   Full Fist Ability (or Gap to Distal Palmar Crease) Unable due to stiffness/soreness  Thumb Opposition  (Kapandji Scale)  5/10  (Blank rows = not tested)   HAND STRENGTH & FUNCTION: Eval: Observed weakness in affected hand/arm, grossly 3-/5 MMT, but specific gripping and resistance training contraindicated today. Also at least mild observed coordination impairments with affected hand/arm due to stiffness and soreness. These deficits are expected to improve with HEP and recommendations.    COORDINATION: Eval: Mild observed coordination impairments with surgical hand, as seen by pain,stiffness, etc. Expected to improve with HEP and recommendations.   SENSATION: Eval:  Light touch mildly diminished especially through sx area. Expected to improve with HEP and recommendations.   EDEMA:   Eval:  Mildly swollen in surgical hand today.  Expected to improve with HEP and recommendations.   COGNITION: Eval: Overall cognitive status: WFL for evaluation today   OBSERVATIONS:   Eval: Surgical site is clean and no overt signs of infection, no drainage, signs of dehiscence, etc.  Tenderness and swelling is within normal limits for post-op timeframe.     TODAY'S TREATMENT:  Post-evaluation treatment:   The patient was given safety information for managing post-op wound, including not to soak wound, to keep clean and dry, to start with gentle scar mobilizations approx 3 days after stitches are removed and if the wound is closed. The patient should replace their wound dressing at least 1x daily, and monitor for signs of infection.  The patient was supplied with compressive gauze to help with swelling as needed.  The patient should contact the surgeon with any concerns immediately.   The patient should also avoid any strong gripping, push, pull, weight bearing or repetitive motion for the next month.  The patient  should not be doing painful activities.  The patient should not rest on their palm, keep  the wrist bent for long time periods, or sleep on their hand. After a month, the patient can progressively return to all light, normal activities. Sports and heavy weight lifting should be withheld for a total of 3 months.   The patient was also educated (explanation and demonstration) on the following home exercise program including tolerable range of motion, gentle passive range of motion, scar care, progressive desensitization, prevention of soft tissue contractures, etc. The patient states understanding all directions and feels comfortable with doing this at home, self-management, and following up with the surgeon as needed/scheduled.    CTR Exercises  - Turn [employer] Facing Up & Down  - 4 x daily - 15 reps - Bend and Pull Back Wrist SLOWLY  - 4 x daily - 15 reps - Tendon Glides  - 4 x daily - 5 reps - 3 second hold - Median Nerve Flossing  - 3 x daily - 5 reps Patient Education - Scar Massage    PATIENT  EDUCATION: Education details: See tx section above for details  Person educated: Patient Education method: Verbal Instruction, Teach back, Handouts  Education comprehension: States and demonstrates understanding   HOME EXERCISE PROGRAM: See tx section above for details    GOALS: Goals reviewed with patient? Yes   SHORT TERM GOALS: (STG required if POC>30 days) Target Date: 07/17/24  1.  Pt will demo/state understanding of initial HEP and therapist recommendations to improve pain levels, improve motions and ability and eventually return to normal activities.   Goal status: MET    ASSESSMENT:  CLINICAL IMPRESSION: Patient is a 83 y.o. female who was seen today for occupational therapy evaluation for swelling, pain, weakness and decreased functional ability following carpal tunnel release procedure. The patient is appropriate for OT rehab services and benefited from treatment today. The patient got copious education/treatment today for self-care, wound management, exercises and  how to transition to normal activities in the next 4-6 weeks. The patient agrees that they can manage these recommendations independently, and should not need to return for follow up visits. The patient should follow up with the surgeon with any concerns, and could possibly return to therapy, if needed, with a new order.  The patient will discharge therapy treatment after this visit.     PERFORMANCE DEFICITS: in functional skills including ADLs, IADLs, coordination, dexterity, sensation, edema, ROM, strength, pain, fascial restrictions, flexibility, Fine motor control, body mechanics, endurance, decreased knowledge of precautions, wound, and UE functional use, cognitive skills including problem solving and safety awareness, and psychosocial skills including coping strategies, environmental adaptation, and habits.   IMPAIRMENTS: are limiting patient from ADLs, IADLs, rest and sleep, leisure, and social participation.   COMORBIDITIES: may have co-morbidities  that affects occupational performance. Patient will benefit from skilled OT to address above impairments and improve overall function.  MODIFICATION OR ASSISTANCE TO COMPLETE EVALUATION: No modification of tasks or assist necessary to complete an evaluation.  OT OCCUPATIONAL PROFILE AND HISTORY: Problem focused assessment: Including review of records relating to presenting problem.  CLINICAL DECISION MAKING: LOW - limited treatment options, no task modification necessary  REHAB POTENTIAL: Excellent  EVALUATION COMPLEXITY: Low      PLAN:  OT FREQUENCY: one time visit  OT DURATION: 1 sessions  PLANNED INTERVENTIONS: self care/ADL training, therapeutic exercise, therapeutic activity, neuromuscular re-education, manual therapy, scar mobilization, passive range of motion, splinting, ultrasound, fluidotherapy, compression bandaging, moist heat, cryotherapy, contrast bath, patient/family education, energy conservation, coping strategies  training, and Re-evaluation  RECOMMENDED OTHER SERVICES: none now   CONSULTED AND AGREED WITH PLAN OF CARE: Patient  PLAN FOR NEXT SESSION:   N/A    Melvenia Ada, OTR/L, CHT 07/17/2024, 11:49 AM     Referring diagnosis? G56.03 (ICD-10-CM) - Carpal tunnel syndrome, bilateral  Treatment diagnosis? (if different than referring diagnosis)  M79.641 What was this (referring dx) caused by? [x]  Surgery []  Fall []  Ongoing issue []  Arthritis []  Other: ____________  Laterality: [x]  Rt []  Lt []  Both  Check all possible CPT codes:  *CHOOSE 10 OR LESS*     [id number], [phone]

## 2024-07-17 ENCOUNTER — Ambulatory Visit: Admitting: Rehabilitative and Restorative Service Providers"

## 2024-07-17 ENCOUNTER — Encounter: Payer: Self-pay | Admitting: Rehabilitative and Restorative Service Providers"

## 2024-07-17 ENCOUNTER — Telehealth: Payer: Medicare PPO | Admitting: Nurse Practitioner

## 2024-07-17 ENCOUNTER — Ambulatory Visit (INDEPENDENT_AMBULATORY_CARE_PROVIDER_SITE_OTHER): Admitting: Orthopedic Surgery

## 2024-07-17 DIAGNOSIS — M6281 Muscle weakness (generalized): Secondary | ICD-10-CM

## 2024-07-17 DIAGNOSIS — M79641 Pain in right hand: Secondary | ICD-10-CM | POA: Diagnosis not present

## 2024-07-17 DIAGNOSIS — Z9889 Other specified postprocedural states: Secondary | ICD-10-CM

## 2024-07-17 DIAGNOSIS — R600 Localized edema: Secondary | ICD-10-CM

## 2024-07-17 NOTE — Progress Notes (Signed)
   Danielle Pierce - 83 y.o. female MRN [id number]  Date of birth: [birth year]  Office Visit Note: Visit Date: 07/17/2024 PCP: Caro Harlene POUR, NP Referred by: Caro Harlene POUR, NP  Subjective:  HPI: Danielle Pierce is a 83 y.o. female who presents today for follow up 2 weeks status post right open carpal tunnel release.  Doing very well, numbness and tingling has improved drastically.  She is pleased with her progress.  Pertinent ROS were reviewed with the patient and found to be negative unless otherwise specified above in HPI.   Assessment & Plan: Visit Diagnoses:  1. S/P carpal tunnel release     Plan: Sutures removed today.  Wound is well-healing.  She will be seen by occupational therapy today to begin range of motion exercises with transition to home program when appropriate.  Follow-up myself in approxi-1 month.  Follow-up: No follow-ups on file.   Meds & Orders: No orders of the defined types were placed in this encounter.  No orders of the defined types were placed in this encounter.    Procedures: No procedures performed       Objective:   Vital Signs: There were no vitals taken for this visit.  Ortho Exam Right hand: - Well-healing palmar incision, sutures removed, skin edges well-approximated without erythema or drainage - Composite fist without restriction - Sensation intact to light touch median nerve distribution - 4/5 APB mild thenar atrophy    Imaging: No results found.   Lindsay Straka Afton Alderton, M.D. [facility], Hand Surgery

## 2024-07-24 ENCOUNTER — Encounter: Payer: Self-pay | Admitting: Nurse Practitioner

## 2024-07-24 ENCOUNTER — Telehealth (INDEPENDENT_AMBULATORY_CARE_PROVIDER_SITE_OTHER): Admitting: Nurse Practitioner

## 2024-07-24 DIAGNOSIS — Z0000 Encounter for general adult medical examination without abnormal findings: Secondary | ICD-10-CM | POA: Diagnosis not present

## 2024-07-24 NOTE — Progress Notes (Signed)
 Subjective:   Danielle Pierce is a 83 y.o. female who presents for Medicare Annual (Subsequent) preventive examination.  Visit Complete: Virtual I connected with  Danielle Pierce on 07/24/24 by a video and audio enabled telemedicine application and verified that I am speaking with the correct person using two identifiers.  Patient Location: Home  Provider Location: Office/Clinic  I discussed the limitations of evaluation and management by telemedicine. The patient expressed understanding and agreed to proceed.  Vital Signs: Because this visit was a virtual/telehealth visit, some criteria may be missing or patient reported. Any vitals not documented were not able to be obtained and vitals that have been documented are patient reported.   Cardiac Risk Factors include: advanced age (>25men, >19 women);diabetes mellitus;dyslipidemia;hypertension;obesity (BMI >30kg/m2)     Objective:    There were no vitals filed for this visit. There is no height or weight on file to calculate BMI.     07/24/2024    9:49 AM 07/17/2024   10:18 AM 03/10/2024    2:23 PM 11/02/2023   10:15 AM 09/14/2023    2:43 PM 07/16/2023   10:07 AM 04/27/2023   10:06 AM  Advanced Directives  Does Patient Have a Medical Advance Directive? Yes No Yes Yes Yes Yes Yes  Type of Estate agent of [place];Living will  Healthcare Power of [place];Living will Healthcare Power of [place];Living will Healthcare Power of [place];Living will Healthcare Power of [place];Living will Healthcare Power of [place];Living will  Does patient want to make changes to medical advance directive? No - Patient declined  No - Patient declined No - Patient declined No - Patient declined No - Patient declined No - Patient declined  Copy of Healthcare Power of Attorney in Chart? No - copy requested   No - copy requested No - copy requested No - copy requested No - copy requested  Would patient like information on  creating a medical advance directive? No - Patient declined No - Patient declined         Current Medications (verified) Outpatient Encounter Medications as of 07/24/2024  Medication Sig   acetaminophen-codeine (TYLENOL #3) 300-30 MG tablet Take 1 tablet by mouth every 6 (six) hours as needed.   albuterol  (VENTOLIN  HFA) 108 (90 Base) MCG/ACT inhaler Inhale 2 puffs into the lungs every 6 (six) hours as needed for wheezing or shortness of breath.   amLODipine  (NORVASC ) 5 MG tablet Take 1 tablet (5 mg total) by mouth daily. (Patient taking differently: Take 7.5 mg by mouth daily.)   aspirin 81 MG tablet Take 81 mg by mouth daily.   atorvastatin  (LIPITOR) 20 MG tablet TAKE ONE TABLET BY MOUTH AT BEDTIME   Calcium  Carbonate-Vitamin D  (CALCIUM -VITAMIN D ) 500-200 MG-UNIT per tablet Take 1 tablet by mouth daily.   Carboxymethylcell-Hypromellose 0.25-0.3 % GEL Apply 1 application to eye daily. to alleviate irritation.   Cholecalciferol (VITAMIN D -3 PO) Take 1 tablet by mouth daily.   guaiFENesin -dextromethorphan (ROBITUSSIN DM) 100-10 MG/5ML syrup Take 10 mLs by mouth every 6 (six) hours as needed for cough.   hydrocortisone  (ANUSOL -HC) 25 MG suppository Place 1 suppository (25 mg total) rectally daily as needed for hemorrhoids or anal itching.   polyethylene glycol powder (GLYCOLAX/MIRALAX) 17 GM/SCOOP powder Take 17 g by mouth daily. Dissolve 1 capful (17g) in 4-8 ounces of liquid and take by mouth daily.   Protein POWD Take 1 Scoop by mouth daily.   Semaglutide ,0.25 or 0.5MG /DOS, (OZEMPIC , 0.25 OR 0.5 MG/DOSE,) 2 MG/3ML SOPN INJECT  0.25MG  INTO THE SKIN ONCE A WEEK   vitamin B-12 (CYANOCOBALAMIN) 500 MCG tablet Take 500 mcg by mouth daily.   vitamin C (ASCORBIC ACID) 500 MG tablet Take 500 mg by mouth daily.   vitamin E 400 UNIT capsule Take 400 Units by mouth daily.   No facility-administered encounter medications on file as of 07/24/2024.    Allergies (verified) Patient has no known allergies.    History: Past Medical History:  Diagnosis Date   Arthritis    Ankle   Asymptomatic varicose veins    Benign neoplasm of colon    Bilateral shoulder pain 07/02/14   Cataract    Bilateral - just watching   Cervicitis and endocervicitis 09/13/2011   Disorder of bone and cartilage, unspecified    Disorders of bursae and tendons in shoulder region, unspecified    External hemorrhoids without mention of complication    Fibrocystic breast    Generalized osteoarthrosis, unspecified site    GERD (gastroesophageal reflux disease)    diet controlled, No meds   Hiatal hernia    Hypertension    Impacted cerumen 11/07/2011   Obesity, unspecified    Other diseases of nasal cavity and sinuses(478.19)    Pain in joint, ankle and foot    Pain in joint, pelvic region and thigh    Pain in joint, shoulder region    Reflux esophagitis    SVD (spontaneous vaginal delivery)    x 2   Unspecified essential hypertension    Unspecified vitamin D  deficiency    Past Surgical History:  Procedure Laterality Date   COLONOSCOPY  2006   Dr.Orr   COLONOSCOPY  07/08/2013   Dr. Avram   FLEXIBLE SIGMOIDOSCOPY  1990   Hemorrhoids    MOUTH SURGERY  2005   DR LUTINS - tooth ext and gum surgery   SHOULDER ARTHROSCOPY W/ ACROMIAL REPAIR Right 07/02/14   Dr. Anderson   UPPER GASTROINTESTINAL ENDOSCOPY     normal   Family History  Problem Relation Age of Onset   Hypertension Mother    Kidney disease Mother        Renal failure   Cancer Brother        Lymphoma   ADD / ADHD Son    Seizures Son    Hypertension Sister    Dementia Sister    Hypertension Sister    Hypertension Sister    Cancer - Other Sister    Early death Brother        Some type of accident   Colon cancer Father 33   Stomach cancer Neg Hx    Rectal cancer Neg Hx    Social History   Socioeconomic History   Marital status: Widowed    Spouse name: Not on file   Number of children: Not on file   Years of education: Not on file    Highest education level: Not on file  Occupational History   Occupation: retired Camera operator  Tobacco Use   Smoking status: Never   Smokeless tobacco: Never  Vaping Use   Vaping status: Never Used  Substance and Sexual Activity   Alcohol use: Yes    Comment: occasional mixed drink    Drug use: No   Sexual activity: Not Currently    Birth control/protection: Post-menopausal  Other Topics Concern   Not on file  Social History Narrative   Married    Never smoked   Alcohlol none   Exercise treadmill 3 times a week  Social Drivers of Corporate investment banker Strain: Low Risk  (06/13/2018)   Overall Financial Resource Strain (CARDIA)    Difficulty of Paying Living Expenses: Not hard at all  Food Insecurity: No Food Insecurity (06/13/2018)   Hunger Vital Sign    Worried About Running Out of Food in the Last Year: Never true    Ran Out of Food in the Last Year: Never true  Transportation Needs: No Transportation Needs (06/13/2018)   PRAPARE - Administrator, Civil Service (Medical): No    Lack of Transportation (Non-Medical): No  Physical Activity: Inactive (06/13/2018)   Exercise Vital Sign    Days of Exercise per Week: 0 days    Minutes of Exercise per Session: 0 min  Stress: Stress Concern Present (06/13/2018)   [employer] of Occupational Health - Occupational Stress Questionnaire    Feeling of Stress : To some extent  Social Connections: Moderately Integrated (06/13/2018)   Social Connection and Isolation Panel    Frequency of Communication with Friends and Family: More than three times a week    Frequency of Social Gatherings with Friends and Family: More than three times a week    Attends Religious Services: More than 4 times per year    Active Member of [employer] or Organizations: No    Attends Engineer, structural: Never    Marital Status: Married    Tobacco Counseling Counseling given: Not Answered   Clinical  Intake:  Pre-visit preparation completed: Yes  Pain : No/denies pain     BMI - recorded: 41 Nutritional Status: BMI > 30  Obese Nutritional Risks: None Diabetes: Yes  How often do you need to have someone help you when you read instructions, pamphlets, or other written materials from your doctor or pharmacy?: 1 - Never         Activities of Daily Living    07/24/2024   10:09 AM  In your present state of health, do you have any difficulty performing the following activities:  Hearing? 0  Vision? 0  Difficulty concentrating or making decisions? 0  Walking or climbing stairs? 1  Comment not steady  Dressing or bathing? 1  Doing errands, shopping? 0  Preparing Food and eating ? N  Using the Toilet? Y  In the past six months, have you accidently leaked urine? Y  Do you have problems with loss of bowel control? N  Managing your Medications? N  Managing your Finances? N  Housekeeping or managing your Housekeeping? N    Patient Care Team: Caro Harlene POUR, NP as PCP - General (Geriatric Medicine) Anderson Maude ORN, MD (Inactive) as Consulting Physician (Orthopedic Surgery) Ivin Kocher, MD as Consulting Physician (Dermatology) Avram Lupita BRAVO, MD as Consulting Physician (Gastroenterology) Waylan Cain, MD as Consulting Physician (Ophthalmology)  Indicate any recent Medical Services you may have received from other than [facility] providers in the past year (date may be approximate).     Assessment:   This is a routine wellness examination for Danielle Pierce.  Hearing/Vision screen Vision Screening - Comments:: Eye Dr. In [place] Last Visit: 08/2023   Goals Addressed   None    Depression Screen    07/24/2024    9:51 AM 06/16/2024    3:13 PM 02/11/2024   11:45 AM 01/31/2024    2:23 PM 11/02/2023   10:15 AM 09/14/2023    2:42 PM 07/16/2023    9:59 AM  PHQ 2/9 Scores  PHQ - 2 Score 0 0 0  0 0 0 0    Fall Risk    07/24/2024    9:51 AM 06/16/2024    3:13 PM  02/11/2024   11:45 AM 01/31/2024    2:23 PM 11/02/2023   10:02 AM  Fall Risk   Falls in the past year? 0 0 0 0 0  Number falls in past yr: 0 0 0 0 0  Injury with Fall? 0 0 0 0 0  Risk for fall due to : No Fall Risks Orthopedic patient No Fall Risks No Fall Risks No Fall Risks  Follow up Falls evaluation completed Falls evaluation completed Falls prevention discussed;Falls evaluation completed Education provided Falls evaluation completed    MEDICARE RISK AT HOME: Medicare Risk at Home Any stairs in or around the home?: Yes If so, are there any without handrails?: No Home free of loose throw rugs in walkways, pet beds, electrical cords, etc?: Yes Adequate lighting in your home to reduce risk of falls?: Yes Life alert?: No Use of a cane, walker or w/c?: No Grab bars in the bathroom?: No Shower chair or bench in shower?: No Elevated toilet seat or a handicapped toilet?: Yes  TIMED UP AND GO:  Was the test performed?  No    Cognitive Function:    07/16/2023   10:09 AM 06/24/2019    9:37 AM 06/13/2018    8:51 AM 11/08/2016   11:29 AM  MMSE - Mini Mental State Exam  Orientation to time 5 5 5 5    Orientation to Place 5 5 5 5    Registration 3 3 3 3    Attention/ Calculation 5 5 5 5    Recall 1 0 2 2   Language- name 2 objects 2 2 2 2    Language- repeat 1 1 1 1   Language- follow 3 step command 3 3 3 3    Language- read & follow direction 1 1 1 1    Write a sentence 1 1 1 1    Copy design 1 1 0 1   Total score 28 27 28 29       Data saved with a previous flowsheet row definition        07/24/2024    9:51 AM 07/13/2022   10:43 AM 07/07/2021   10:28 AM 07/02/2020    9:49 AM  6CIT Screen  What Year? 0 points 0 points 0 points 0 points  What month? 0 points 0 points 0 points 0 points  What time? 0 points 0 points 0 points 0 points  Count back from 20 0 points 0 points 0 points 0 points  Months in reverse 0 points 0 points 0 points 0 points  Repeat phrase  0 points 0 points 2 points   Total Score  0 points 0 points 2 points    Immunizations Immunization History  Administered Date(s) Administered   Fluad Quad(high Dose 65+) 06/24/2019, 08/19/2020, 07/29/2021, 07/24/2022   Fluad Trivalent(High Dose 65+) 07/16/2023   INFLUENZA, HIGH DOSE SEASONAL PF 09/17/2017, 06/17/2018   Moderna Covid-19 Vaccine Bivalent Booster 94yrs & up 06/15/2023   PFIZER Comirnaty(Gray Top)Covid-19 Tri-Sucrose Vaccine 01/31/2021   PFIZER(Purple Top)SARS-COV-2 Vaccination 11/08/2019, 11/29/2019, 07/30/2020   PPD Test 10/02/1997   Pfizer Covid-19 Vaccine Bivalent Booster 48yrs & up 07/15/2021   Pneumococcal Conjugate-13 05/10/2017   Pneumococcal Polysaccharide-23 06/13/2018   Td 04/30/1998   Tdap 04/19/2020    TDAP status: Up to date  Flu Vaccine status: Due, Education has been provided regarding the importance of this vaccine. Advised may receive this  vaccine at local pharmacy or Health Dept. Aware to provide a copy of the vaccination record if obtained from local pharmacy or Health Dept. Verbalized acceptance and understanding.  Pneumococcal vaccine status: Up to date  Covid-19 vaccine status: Information provided on how to obtain vaccines.   Qualifies for Shingles Vaccine? Yes   Zostavax completed No   Shingrix  Completed?: No.    Education has been provided regarding the importance of this vaccine. Patient has been advised to call insurance company to determine out of pocket expense if they have not yet received this vaccine. Advised may also receive vaccine at local pharmacy or Health Dept. Verbalized acceptance and understanding.  Screening Tests Health Maintenance  Topic Date Due   Zoster Vaccines- Shingrix  (1 of 2) Never done   Influenza Vaccine  05/02/2024   COVID-19 Vaccine (7 - 2025-26 season) 06/02/2024   OPHTHALMOLOGY EXAM  08/20/2024   HEMOGLOBIN A1C  10/22/2024   FOOT EXAM  11/01/2024   Diabetic kidney evaluation - Urine ACR  04/21/2025   Diabetic kidney evaluation -  eGFR measurement  06/27/2025   Medicare Annual Wellness (AWV)  07/24/2025   DEXA SCAN  08/01/2025   DTaP/Tdap/Td (3 - Td or Tdap) 04/19/2030   Pneumococcal Vaccine: 50+ Years  Completed   Meningococcal B Vaccine  Aged Out   Hepatitis C Screening  Discontinued    Health Maintenance  Health Maintenance Due  Topic Date Due   Zoster Vaccines- Shingrix  (1 of 2) Never done   Influenza Vaccine  05/02/2024   COVID-19 Vaccine (7 - 2025-26 season) 06/02/2024    Colorectal cancer screening: No longer required.   Mammogram status: No longer required due to age.  Bone Density status: Completed 08/02/2023. Results reflect: Bone density results: OSTEOPENIA. Repeat every 2 years.  Lung Cancer Screening: (Low Dose CT Chest recommended if Age 83-80 years, 20 pack-year currently smoking OR have quit w/in 15years.) does not qualify.   Lung Cancer Screening Referral: na  Additional Screening:  Hepatitis C Screening: does not qualify  Vision Screening: Recommended annual ophthalmology exams for early detection of glaucoma and other disorders of the eye. Is the patient up to date with their annual eye exam?  Yes  Who is the provider or what is the name of the office in which the patient attends annual eye exams? [facility] If pt is not established with a provider, would they like to be referred to a provider to establish care? No .   Dental Screening: Recommended annual dental exams for proper oral hygiene  Diabetic Foot Exam: Diabetic Foot Exam: Completed 11/02/2023  Community Resource Referral / Chronic Care Management: CRR required this visit?  No   CCM required this visit?  No     Plan:     I have personally reviewed and noted the following in the patient's chart:   Medical and social history Use of alcohol, tobacco or illicit drugs  Current medications and supplements including opioid prescriptions. Patient is not currently taking opioid prescriptions. Functional ability and  status Nutritional status Physical activity Advanced directives List of other physicians Hospitalizations, surgeries, and ER visits in previous 12 months Vitals Screenings to include cognitive, depression, and falls Referrals and appointments  In addition, I have reviewed and discussed with patient certain preventive protocols, quality metrics, and best practice recommendations. A written personalized care plan for preventive services as well as general preventive health recommendations were provided to patient.     Harlene MARLA An, NP   07/24/2024  After Visit Summary: (MyChart) Due to this being a telephonic visit, the after visit summary with patients personalized plan was offered to patient via MyChart

## 2024-07-24 NOTE — Progress Notes (Signed)
 This service is provided via telemedicine  No vital signs collected/recorded due to the encounter was a telemedicine visit.   Location of patient (ex: home, work):  Home  Patient consents to a telephone visit:  Yes  Location of the provider (ex: office, home):  Office [place].   Name of any referring provider:  na  Names of all persons participating in the telemedicine service and their role in the encounter:  Chay[place], Gantt Onetta Spainhower, CMA, Harlene An, NP  Time spent on call:  7:10

## 2024-07-24 NOTE — Patient Instructions (Signed)
  Danielle Pierce , Thank you for taking time to come for your Medicare Wellness Visit. I appreciate your ongoing commitment to your health goals. Please review the following plan we discussed and let me know if I can assist you in the future.   To get flu shot and COVID booster at local pharmacy To get shingles vaccine at local pharmacy    This is a list of the screening recommended for you and due dates:  Health Maintenance  Topic Date Due   Zoster (Shingles) Vaccine (1 of 2) Never done   Flu Shot  05/02/2024   COVID-19 Vaccine (7 - 2025-26 season) 06/02/2024   Eye exam for diabetics  08/20/2024   Hemoglobin A1C  10/22/2024   Complete foot exam   11/01/2024   Yearly kidney health urinalysis for diabetes  04/21/2025   Yearly kidney function blood test for diabetes  06/27/2025   Medicare Annual Wellness Visit  07/24/2025   DEXA scan (bone density measurement)  08/01/2025   DTaP/Tdap/Td vaccine (3 - Td or Tdap) 04/19/2030   Pneumococcal Vaccine for age over 63  Completed   Meningitis B Vaccine  Aged Out   Hepatitis C Screening  Discontinued

## 2024-07-25 ENCOUNTER — Ambulatory Visit (INDEPENDENT_AMBULATORY_CARE_PROVIDER_SITE_OTHER): Admitting: Nurse Practitioner

## 2024-07-25 ENCOUNTER — Encounter: Payer: Self-pay | Admitting: Nurse Practitioner

## 2024-07-25 VITALS — BP 160/92 | HR 60 | Temp 97.6°F | Resp 20 | Ht 64.0 in | Wt 240.6 lb

## 2024-07-25 DIAGNOSIS — Z23 Encounter for immunization: Secondary | ICD-10-CM

## 2024-07-25 DIAGNOSIS — N1831 Chronic kidney disease, stage 3a: Secondary | ICD-10-CM

## 2024-07-25 DIAGNOSIS — I10 Essential (primary) hypertension: Secondary | ICD-10-CM | POA: Diagnosis not present

## 2024-07-25 DIAGNOSIS — K5903 Drug induced constipation: Secondary | ICD-10-CM

## 2024-07-25 MED ORDER — AMLODIPINE BESYLATE 10 MG PO TABS: 10.0000 mg | ORAL_TABLET | Freq: Every day | ORAL | 1 refills | Status: DC

## 2024-07-25 NOTE — Progress Notes (Signed)
 Careteam: Patient Care Team: Danielle Harlene POUR, NP as PCP - General (Geriatric Medicine) Anderson Maude ORN, MD (Inactive) as Consulting Physician (Orthopedic Surgery) Ivin Kocher, MD as Consulting Physician (Dermatology) Avram Lupita BRAVO, MD as Consulting Physician (Gastroenterology) Waylan Cain, MD as Consulting Physician (Ophthalmology)  PLACE OF SERVICE:  [facility]  Advanced Directive information   No Known Allergies  Chief Complaint  Patient presents with   Follow-up    Follow up for blood pressure.   HPI: The patient is an 83 year old female presenting for follow-up regarding hypertension management after a medication change on 06/16/24. At the previous visit, losartan  was discontinued due to complaints of dizziness, gait instability, and inconsistent adherence. Amlodipine  5 mg daily was initiated, and the patient was advised to monitor her blood pressure at home and bring in a log. The most recent comprehensive metabolic panel was dated 06/27/24.  She was recently evaluated at an urgent care clinic for elevated blood pressure, where she was advised to increase her dose of amlodipine  to 1.5 tablets daily until follow-up with her primary care provider.  Today, the patient reports she forgot to bring her BP log but estimates her readings are typically in the 140s/80s range using a wrist monitor. She clarifies that she was not experiencing dizziness previously, but rather a sense of unsteadiness. Currently, she continues to feel intermittently unsteady and reports new-onset dizziness beginning approximately two days ago. She describes feeling lightheaded upon standing quickly and needing to pause before rising to avoid feeling like she might fall.  She denies new leg swelling, shortness of breath, chest pain, visual disturbances, and headaches. She does report mild eye burning.  Diet is suboptimal, though she denies excessive salt intake. She acknowledges inadequate daily  water consumption. Urinary and bowel habits are reported as normal. She notes that Ozempic  causes constipation and has taken an over-the-counter remedy. She expresses interest in starting a daily stool softener.  Review of Systems:  Review of Systems  Constitutional: Negative.   HENT: Negative.    Respiratory: Negative.  Negative for shortness of breath.   Cardiovascular:  Positive for leg swelling. Negative for chest pain.  Gastrointestinal:  Positive for constipation.  Musculoskeletal:  Positive for joint pain.  Neurological:  Positive for dizziness. Negative for headaches.  Psychiatric/Behavioral: Negative.     Past Medical History:  Diagnosis Date   Arthritis    Ankle   Asymptomatic varicose veins    Benign neoplasm of colon    Bilateral shoulder pain 07/02/14   Cataract    Bilateral - just watching   Cervicitis and endocervicitis 09/13/2011   Disorder of bone and cartilage, unspecified    Disorders of bursae and tendons in shoulder region, unspecified    External hemorrhoids without mention of complication    Fibrocystic breast    Generalized osteoarthrosis, unspecified site    GERD (gastroesophageal reflux disease)    diet controlled, No meds   Hiatal hernia    Hypertension    Impacted cerumen 11/07/2011   Obesity, unspecified    Other diseases of nasal cavity and sinuses(478.19)    Pain in joint, ankle and foot    Pain in joint, pelvic region and thigh    Pain in joint, shoulder region    Reflux esophagitis    SVD (spontaneous vaginal delivery)    x 2   Unspecified essential hypertension    Unspecified vitamin D  deficiency    Past Surgical History:  Procedure Laterality Date   COLONOSCOPY  2006  Dr.Orr   COLONOSCOPY  07/08/2013   Dr. Avram   FLEXIBLE SIGMOIDOSCOPY  1990   Hemorrhoids    MOUTH SURGERY  2005   DR LUTINS - tooth ext and gum surgery   SHOULDER ARTHROSCOPY W/ ACROMIAL REPAIR Right 07/02/14   Dr. Anderson   UPPER GASTROINTESTINAL ENDOSCOPY      normal   Social History:   reports that she has never smoked. She has never used smokeless tobacco. She reports current alcohol use. She reports that she does not use drugs.  Family History  Problem Relation Age of Onset   Hypertension Mother    Kidney disease Mother        Renal failure   Cancer Brother        Lymphoma   ADD / ADHD Son    Seizures Son    Hypertension Sister    Dementia Sister    Hypertension Sister    Hypertension Sister    Cancer - Other Sister    Early death Brother        Some type of accident   Colon cancer Father 80   Stomach cancer Neg Hx    Rectal cancer Neg Hx    Medications: Patient's Medications  New Prescriptions   No medications on file  Previous Medications   ACETAMINOPHEN-CODEINE (TYLENOL #3) 300-30 MG TABLET    Take 1 tablet by mouth every 6 (six) hours as needed.   ALBUTEROL  (VENTOLIN  HFA) 108 (90 BASE) MCG/ACT INHALER    Inhale 2 puffs into the lungs every 6 (six) hours as needed for wheezing or shortness of breath.   AMLODIPINE  (NORVASC ) 5 MG TABLET    Take 1 tablet (5 mg total) by mouth daily.   ASPIRIN 81 MG TABLET    Take 81 mg by mouth daily.   ATORVASTATIN  (LIPITOR) 20 MG TABLET    TAKE ONE TABLET BY MOUTH AT BEDTIME   CALCIUM  CARBONATE-VITAMIN D  (CALCIUM -VITAMIN D ) 500-200 MG-UNIT PER TABLET    Take 1 tablet by mouth daily.   CARBOXYMETHYLCELL-HYPROMELLOSE 0.25-0.3 % GEL    Apply 1 application to eye daily. to alleviate irritation.   CHOLECALCIFEROL (VITAMIN D -3 PO)    Take 1 tablet by mouth daily.   GUAIFENESIN -DEXTROMETHORPHAN (ROBITUSSIN DM) 100-10 MG/5ML SYRUP    Take 10 mLs by mouth every 6 (six) hours as needed for cough.   HYDROCORTISONE  (ANUSOL -HC) 25 MG SUPPOSITORY    Place 1 suppository (25 mg total) rectally daily as needed for hemorrhoids or anal itching.   POLYETHYLENE GLYCOL POWDER (GLYCOLAX/MIRALAX) 17 GM/SCOOP POWDER    Take 17 g by mouth daily. Dissolve 1 capful (17g) in 4-8 ounces of liquid and take by mouth daily.    PROTEIN POWD    Take 1 Scoop by mouth daily.   SEMAGLUTIDE ,0.25 OR 0.5MG /DOS, (OZEMPIC , 0.25 OR 0.5 MG/DOSE,) 2 MG/3ML SOPN    INJECT 0.25MG  INTO THE SKIN ONCE A WEEK   VITAMIN B-12 (CYANOCOBALAMIN) 500 MCG TABLET    Take 500 mcg by mouth daily.   VITAMIN C (ASCORBIC ACID) 500 MG TABLET    Take 500 mg by mouth daily.   VITAMIN E 400 UNIT CAPSULE    Take 400 Units by mouth daily.  Modified Medications   No medications on file  Discontinued Medications   No medications on file   Physical Exam:  There were no vitals filed for this visit. There is no height or weight on file to calculate BMI. Wt Readings from Last 3 Encounters:  06/16/24  243 lb 12.8 oz (110.6 kg)  05/02/24 248 lb 9.6 oz (112.8 kg)  04/24/24 245 lb 3.2 oz (111.2 kg)   Physical Exam Vitals reviewed.  Constitutional:      Appearance: Normal appearance. She is obese.  HENT:     Head: Normocephalic and atraumatic.  Eyes:     Conjunctiva/sclera: Conjunctivae normal.  Cardiovascular:     Rate and Rhythm: Normal rate and regular rhythm.     Pulses: Normal pulses.     Heart sounds: Normal heart sounds.     Comments: BLE edema at baseline per pt Pulmonary:     Effort: Pulmonary effort is normal.     Breath sounds: Examination of the right-upper field reveals rhonchi. Rhonchi present.  Abdominal:     General: Bowel sounds are normal.     Palpations: Abdomen is soft.  Musculoskeletal:     Right wrist: Decreased range of motion.     Right lower leg: 1+ Edema present.     Left lower leg: 1+ Edema present.     Comments: Surgical incision to right hand  Skin:    General: Skin is warm and dry.  Neurological:     General: No focal deficit present.     Mental Status: She is alert and oriented to person, place, and time.  Psychiatric:        Mood and Affect: Mood normal.        Behavior: Behavior normal.    Labs reviewed: Basic Metabolic Panel: Recent Labs    01/31/24 1456 04/21/24 1413 06/27/24 1937  NA 144  141 139  K 4.2 4.1 3.5  CL 108 105 108  CO2 30 28 23   GLUCOSE 79 100* 93  BUN 12 13 8   CREATININE 1.05* 1.04* 0.89  CALCIUM  9.5 9.5 9.1   Liver Function Tests: Recent Labs    01/31/24 1456 04/21/24 1413 06/27/24 1937  AST 23 25 29   ALT 14 17 15   ALKPHOS  --   --  92  BILITOT 0.7 0.7 0.7  PROT 6.3 6.5 6.6  ALBUMIN  --   --  3.9   No results for input(s): LIPASE, AMYLASE in the last 8760 hours. No results for input(s): AMMONIA in the last 8760 hours. CBC: Recent Labs    10/29/23 0928 01/31/24 1456 04/21/24 1413 06/27/24 1937  WBC 7.5 9.0 7.7 9.0  NEUTROABS 3,600 5,031 4,474  --   HGB 13.3 12.9 13.5 13.4  HCT 40.9 39.6 40.8 39.8  MCV 93.8 93.0 94.9 91.9  PLT 188 208 192 220   Lipid Panel: Recent Labs    04/21/24 1413  CHOL 159  HDL 59  LDLCALC 83  TRIG 79  CHOLHDL 2.7   TSH: No results for input(s): TSH in the last 8760 hours. A1C: Lab Results  Component Value Date   HGBA1C 6.1 (H) 04/21/2024   Assessment/Plan 1. Essential hypertension (Primary) - Blood pressure today: 160/92 mmHg. - Increase amlodipine  to 10 mg daily for improved BP control. - Monitor for potential side effects, including peripheral edema (ankle swelling). - Encourage patient to maintain a daily blood pressure log and bring it to the next visit. - Advise patient to rise slowly from sitting or lying positions to minimize dizziness. - Reinforce importance of hydration and adherence to a low-sodium diet.  2. Stage 3a chronic kidney disease (HCC) - Most recent CMP from September 2025 was within normal limits. - Plan to repeat CMP in 3 months to monitor renal function. - Continue  amlodipine  for blood pressure management. - Encourage increased oral fluid intake to support kidney function.  3. Drug-induced constipation - Constipation is a known side effect of GLP-1 agonists. - Recommend daily use of OTC stool softener (e.g., Colace). - Encourage increased dietary fiber and  physical activity (e.g., daily walking).  4. Immunization due - Administer high-dose influenza vaccine today. - Flu vaccine HIGH DOSE PF(Fluzone Trivalent)    Return in 2 weeks for blood pressure check and medication response assessment.  Waylan Rabon, RN DNP-AGPCNP Student I personally was present during the history, physical exam and medical decision-making activities of this service and have verified that the service and findings are accurately documented in the student's note  Kelechi Astarita K. Danielle BODILY [facility] [phone]

## 2024-07-25 NOTE — Patient Instructions (Addendum)
 1.Report to local pharmacy to receive Covid & Shingles vaccines.  2. Increase norvasc  5 mg tablet to 2 tablets to = 10 mg daily  3. Add colace 100 mg by mouth daily for stool softener

## 2024-08-02 ENCOUNTER — Other Ambulatory Visit: Payer: Self-pay | Admitting: Nurse Practitioner

## 2024-08-02 DIAGNOSIS — E1122 Type 2 diabetes mellitus with diabetic chronic kidney disease: Secondary | ICD-10-CM

## 2024-08-04 ENCOUNTER — Encounter: Payer: Self-pay | Admitting: Radiology

## 2024-08-12 NOTE — Progress Notes (Unsigned)
   Danielle Pierce - 83 y.o. female MRN [id number]  Date of birth: 12/09/1940  Office Visit Note: Visit Date: 08/13/2024 PCP: Caro Harlene POUR, NP Referred by: Caro Harlene POUR, NP  Subjective:  HPI: Danielle Pierce is a 83 y.o. female who presents today for follow up 6 weeks status post right wrist open carpal tunnel release.  Pertinent ROS were reviewed with the patient and found to be negative unless otherwise specified above in HPI.   Assessment & Plan: Visit Diagnoses: No diagnosis found.  Plan: ***  Follow-up: No follow-ups on file.   Meds & Orders: No orders of the defined types were placed in this encounter.  No orders of the defined types were placed in this encounter.    Procedures: No procedures performed       Objective:   Vital Signs: There were no vitals taken for this visit.  Ortho Exam ***  Imaging: No results found.   Nery Kalisz Afton Alderton, M.D. [facility], Hand Surgery

## 2024-08-13 ENCOUNTER — Telehealth: Payer: Self-pay | Admitting: Physical Medicine and Rehabilitation

## 2024-08-13 ENCOUNTER — Encounter: Payer: Self-pay | Admitting: Nurse Practitioner

## 2024-08-13 ENCOUNTER — Ambulatory Visit: Admitting: Orthopedic Surgery

## 2024-08-13 ENCOUNTER — Ambulatory Visit (INDEPENDENT_AMBULATORY_CARE_PROVIDER_SITE_OTHER): Payer: Self-pay | Admitting: Nurse Practitioner

## 2024-08-13 VITALS — BP 114/72 | HR 76 | Temp 97.6°F | Ht 64.0 in | Wt 239.0 lb

## 2024-08-13 DIAGNOSIS — I10 Essential (primary) hypertension: Secondary | ICD-10-CM | POA: Diagnosis not present

## 2024-08-13 DIAGNOSIS — N1831 Chronic kidney disease, stage 3a: Secondary | ICD-10-CM

## 2024-08-13 DIAGNOSIS — Z9889 Other specified postprocedural states: Secondary | ICD-10-CM

## 2024-08-13 DIAGNOSIS — E1122 Type 2 diabetes mellitus with diabetic chronic kidney disease: Secondary | ICD-10-CM

## 2024-08-13 DIAGNOSIS — M5030 Other cervical disc degeneration, unspecified cervical region: Secondary | ICD-10-CM

## 2024-08-13 DIAGNOSIS — Z7985 Long-term (current) use of injectable non-insulin antidiabetic drugs: Secondary | ICD-10-CM | POA: Diagnosis not present

## 2024-08-13 DIAGNOSIS — K5903 Drug induced constipation: Secondary | ICD-10-CM

## 2024-08-13 DIAGNOSIS — R2689 Other abnormalities of gait and mobility: Secondary | ICD-10-CM

## 2024-08-13 MED ORDER — LOSARTAN POTASSIUM 100 MG PO TABS: 100.0000 mg | ORAL_TABLET | Freq: Every day | ORAL | 1 refills | Status: AC

## 2024-08-13 NOTE — Telephone Encounter (Signed)
 Patient called and ask if she can get an appointment for injection in her shoulder. CB#[phone]

## 2024-08-13 NOTE — Patient Instructions (Signed)
 STOP NORVASC /amlodipine  Restart LOSARTAN  100 mg daily

## 2024-08-13 NOTE — Progress Notes (Signed)
 Careteam: Patient Care Team: Caro Harlene POUR, NP as PCP - General (Geriatric Medicine) Anderson Maude ORN, MD (Inactive) as Consulting Physician (Orthopedic Surgery) Ivin Kocher, MD as Consulting Physician (Dermatology) Avram Lupita BRAVO, MD as Consulting Physician (Gastroenterology) Waylan Cain, MD as Consulting Physician (Ophthalmology)  PLACE OF SERVICE:  [facility]  Advanced Directive information    No Known Allergies  Chief Complaint  Patient presents with   Medical Management of Chronic Issues    Bp check up yesterday pt stated it was 114/68 for the first time but bp has been fluctuating.  Pt also wanted to talk about both ankles swelling was informed that amlodipine  may be the cause of it and wanted to see if she can be put back on losartan .     HPI:  Discussed the use of AI scribe software for clinical note transcription with the patient, who gave verbal consent to proceed.  History of Present Illness Danielle Pierce is an 83 year old female with hypertension who presents for a two-week blood pressure check.  She was supposed to increase her Norvasc  (amlodipine ) to 10 mg but has continued taking 7.5 mg daily. Her home blood pressure readings have been around 143-141 mmHg, with a recent reading of 114/68 mmHg. She did not pick up the new prescription for 10 mg Norvasc  as it was not available at the pharmacy.  She has noticed swelling in her ankles and reports that she was told amlodipine  could cause swelling. The swelling is worse in the evening. She recalls being previously on losartan  and reports that she experienced dizziness both while taking it and after stopping it. She would like to restart losartan  as it was tolerated and does not feel like the dizziness was due to medication.   She feels unsteady when walking and reports having carpal tunnel syndrome and cervical arthritis. She experiences imbalance, particularly when walking, and has been doing  exercises and received a shot for her neck pain. She is seeing Dr. Eldonna for her neck issues and Dr. Georgina for her carpel tunnel surgery.   She is currently using semaglutide  (Ozempic ) 0.25 mg weekly for diabetes and reports mild constipation, which is managed with a stool softener. She experiences dizziness when standing up quickly but notes improvement in this symptom. She ensures adequate hydration by drinking water throughout the day.  No falls reported.    Review of Systems:  Review of Systems  Constitutional:  Negative for chills, fever and weight loss.  HENT:  Negative for tinnitus.   Respiratory:  Negative for cough, sputum production and shortness of breath.   Cardiovascular:  Positive for leg swelling. Negative for chest pain, palpitations and claudication.  Gastrointestinal:  Positive for constipation. Negative for abdominal pain, diarrhea and heartburn.  Genitourinary:  Negative for dysuria, frequency and urgency.  Musculoskeletal:  Negative for back pain, falls, joint pain and myalgias.  Skin: Negative.   Neurological:  Positive for sensory change and weakness. Negative for dizziness and headaches.  Psychiatric/Behavioral:  Negative for depression and memory loss. The patient does not have insomnia.     Past Medical History:  Diagnosis Date   Arthritis    Ankle   Asymptomatic varicose veins    Benign neoplasm of colon    Bilateral shoulder pain 07/02/14   Cataract    Bilateral - just watching   Cervicitis and endocervicitis 09/13/2011   Disorder of bone and cartilage, unspecified    Disorders of bursae and tendons in shoulder region,  unspecified    External hemorrhoids without mention of complication    Fibrocystic breast    Generalized osteoarthrosis, unspecified site    GERD (gastroesophageal reflux disease)    diet controlled, No meds   Hiatal hernia    Hypertension    Impacted cerumen 11/07/2011   Obesity, unspecified    Other diseases of nasal cavity and  sinuses(478.19)    Pain in joint, ankle and foot    Pain in joint, pelvic region and thigh    Pain in joint, shoulder region    Reflux esophagitis    SVD (spontaneous vaginal delivery)    x 2   Unspecified essential hypertension    Unspecified vitamin D  deficiency    Past Surgical History:  Procedure Laterality Date   COLONOSCOPY  2006   Dr.Orr   COLONOSCOPY  07/08/2013   Dr. Avram   FLEXIBLE SIGMOIDOSCOPY  1990   Hemorrhoids    MOUTH SURGERY  2005   DR LUTINS - tooth ext and gum surgery   SHOULDER ARTHROSCOPY W/ ACROMIAL REPAIR Right 07/02/14   Dr. Anderson   UPPER GASTROINTESTINAL ENDOSCOPY     normal   Social History:   reports that she has never smoked. She has never used smokeless tobacco. She reports current alcohol use. She reports that she does not use drugs.  Family History  Problem Relation Age of Onset   Hypertension Mother    Kidney disease Mother        Renal failure   Cancer Brother        Lymphoma   ADD / ADHD Son    Seizures Son    Hypertension Sister    Dementia Sister    Hypertension Sister    Hypertension Sister    Cancer - Other Sister    Early death Brother        Some type of accident   Colon cancer Father 61   Stomach cancer Neg Hx    Rectal cancer Neg Hx     Medications: Patient's Medications  New Prescriptions   LOSARTAN  (COZAAR ) 100 MG TABLET    Take 1 tablet (100 mg total) by mouth daily.  Previous Medications   ACETAMINOPHEN-CODEINE (TYLENOL #3) 300-30 MG TABLET    Take 1 tablet by mouth every 6 (six) hours as needed.   ALBUTEROL  (VENTOLIN  HFA) 108 (90 BASE) MCG/ACT INHALER    Inhale 2 puffs into the lungs every 6 (six) hours as needed for wheezing or shortness of breath.   AMLODIPINE  (NORVASC ) 10 MG TABLET    Take 1 tablet (10 mg total) by mouth daily.   ASPIRIN 81 MG TABLET    Take 81 mg by mouth daily.   ATORVASTATIN  (LIPITOR) 20 MG TABLET    TAKE ONE TABLET BY MOUTH AT BEDTIME   CALCIUM  CARBONATE-VITAMIN D  (CALCIUM -VITAMIN D )  500-200 MG-UNIT PER TABLET    Take 1 tablet by mouth daily.   CARBOXYMETHYLCELL-HYPROMELLOSE 0.25-0.3 % GEL    Apply 1 application to eye daily. to alleviate irritation.   CHOLECALCIFEROL (VITAMIN D -3 PO)    Take 1 tablet by mouth daily.   GUAIFENESIN -DEXTROMETHORPHAN (ROBITUSSIN DM) 100-10 MG/5ML SYRUP    Take 10 mLs by mouth every 6 (six) hours as needed for cough.   HYDROCORTISONE  (ANUSOL -HC) 25 MG SUPPOSITORY    Place 1 suppository (25 mg total) rectally daily as needed for hemorrhoids or anal itching.   POLYETHYLENE GLYCOL POWDER (GLYCOLAX/MIRALAX) 17 GM/SCOOP POWDER    Take 17 g by mouth daily. Dissolve  1 capful (17g) in 4-8 ounces of liquid and take by mouth daily.   PROTEIN POWD    Take 1 Scoop by mouth daily.   SEMAGLUTIDE ,0.25 OR 0.5MG /DOS, (OZEMPIC , 0.25 OR 0.5 MG/DOSE,) 2 MG/3ML SOPN    INJECT 0.25MG  INTO THE SKIN ONCE A WEEK   VITAMIN B-12 (CYANOCOBALAMIN) 500 MCG TABLET    Take 500 mcg by mouth daily.   VITAMIN C (ASCORBIC ACID) 500 MG TABLET    Take 500 mg by mouth daily.   VITAMIN E 400 UNIT CAPSULE    Take 400 Units by mouth daily.  Modified Medications   No medications on file  Discontinued Medications   No medications on file    Physical Exam:  Vitals:   08/13/24 0847  BP: 114/72  Pulse: 76  Temp: 97.6 F (36.4 C)  SpO2: 98%  Weight: 239 lb (108.4 kg)  Height: 5' 4 (1.626 m)   Body mass index is 41.02 kg/m. Wt Readings from Last 3 Encounters:  08/13/24 239 lb (108.4 kg)  07/25/24 240 lb 9.6 oz (109.1 kg)  06/16/24 243 lb 12.8 oz (110.6 kg)    Physical Exam Constitutional:      General: She is not in acute distress.    Appearance: She is well-developed. She is not diaphoretic.  HENT:     Head: Normocephalic and atraumatic.     Mouth/Throat:     Pharynx: No oropharyngeal exudate.  Eyes:     Conjunctiva/sclera: Conjunctivae normal.     Pupils: Pupils are equal, round, and reactive to light.  Cardiovascular:     Rate and Rhythm: Normal rate and regular  rhythm.     Heart sounds: Normal heart sounds.  Pulmonary:     Effort: Pulmonary effort is normal.     Breath sounds: Normal breath sounds.  Abdominal:     General: Bowel sounds are normal.     Palpations: Abdomen is soft.  Musculoskeletal:     Cervical back: Normal range of motion and neck supple.     Right lower leg: Edema present.     Left lower leg: Edema present.  Skin:    General: Skin is warm and dry.  Neurological:     Mental Status: She is alert.  Psychiatric:        Mood and Affect: Mood normal.     Labs reviewed: Basic Metabolic Panel: Recent Labs    01/31/24 1456 04/21/24 1413 06/27/24 1937  NA 144 141 139  K 4.2 4.1 3.5  CL 108 105 108  CO2 30 28 23   GLUCOSE 79 100* 93  BUN 12 13 8   CREATININE 1.05* 1.04* 0.89  CALCIUM  9.5 9.5 9.1   Liver Function Tests: Recent Labs    01/31/24 1456 04/21/24 1413 06/27/24 1937  AST 23 25 29   ALT 14 17 15   ALKPHOS  --   --  92  BILITOT 0.7 0.7 0.7  PROT 6.3 6.5 6.6  ALBUMIN  --   --  3.9   No results for input(s): LIPASE, AMYLASE in the last 8760 hours. No results for input(s): AMMONIA in the last 8760 hours. CBC: Recent Labs    10/29/23 0928 01/31/24 1456 04/21/24 1413 06/27/24 1937  WBC 7.5 9.0 7.7 9.0  NEUTROABS 3,600 5,031 4,474  --   HGB 13.3 12.9 13.5 13.4  HCT 40.9 39.6 40.8 39.8  MCV 93.8 93.0 94.9 91.9  PLT 188 208 192 220   Lipid Panel: Recent Labs    04/21/24 1413  CHOL 159  HDL 59  LDLCALC 83  TRIG 79  CHOLHDL 2.7   TSH: No results for input(s): TSH in the last 8760 hours. A1C: Lab Results  Component Value Date   HGBA1C 6.1 (H) 04/21/2024     Assessment/Plan Assessment & Plan Essential hypertension Amlodipine  caused leg swelling. Losartan  previously effective without side effects- thought this was causing dizziness but persisted after stopped but has since subsided.  - Discontinued amlodipine . - Restarted losartan  100 mg daily. - Advised low sodium diet. -  Instructed to monitor blood pressure at home. - Follow up in one month to reassess blood pressure.  Type 2 diabetes mellitus Weight stable with semaglutide . Hydration and caloric intake adequate. - Continue semaglutide  (Ozempic ) 0.25 mg weekly. - Ensure adequate hydration and caloric intake. Follow up A1c at next visit   Drug-induced constipation Stool softener effective. - Continue stool softener as needed.  Abnormal gait and mobility Unsteadiness possibly due to cervical arthritis and carpal tunnel syndrome. - she wants to discuss with orthopedic specialist about potential physical therapy for strengthening and balance. - Consider physical therapy consult for strengthening and balance improvement.  DDD, cervical spine Continues to follow up with ortho S/p injection and has had some home exercises     Return in about 1 month (around 09/12/2024) for blood pressure.  Steadman Prosperi K. Caro BODILY [facility] [phone]

## 2024-08-22 DIAGNOSIS — E119 Type 2 diabetes mellitus without complications: Secondary | ICD-10-CM | POA: Diagnosis not present

## 2024-08-22 DIAGNOSIS — H52203 Unspecified astigmatism, bilateral: Secondary | ICD-10-CM | POA: Diagnosis not present

## 2024-08-22 LAB — OPHTHALMOLOGY REPORT-SCANNED

## 2024-09-01 ENCOUNTER — Telehealth: Payer: Self-pay

## 2024-09-01 ENCOUNTER — Ambulatory Visit: Admitting: Nurse Practitioner

## 2024-09-01 NOTE — Telephone Encounter (Signed)
 Asking for another cervical injection. It lasted 3 months approx 50%. Pain in both arms and worse in Left. No falls or trauma. Current pain score 5

## 2024-09-02 ENCOUNTER — Other Ambulatory Visit: Payer: Self-pay | Admitting: Physical Medicine and Rehabilitation

## 2024-09-02 DIAGNOSIS — M5412 Radiculopathy, cervical region: Secondary | ICD-10-CM

## 2024-09-05 ENCOUNTER — Encounter: Payer: Self-pay | Admitting: Nurse Practitioner

## 2024-09-08 ENCOUNTER — Encounter: Payer: Self-pay | Admitting: Nurse Practitioner

## 2024-09-08 ENCOUNTER — Ambulatory Visit: Payer: Self-pay | Admitting: Nurse Practitioner

## 2024-09-08 VITALS — BP 136/84 | HR 52 | Temp 97.9°F | Ht 64.0 in

## 2024-09-08 DIAGNOSIS — N1831 Chronic kidney disease, stage 3a: Secondary | ICD-10-CM | POA: Diagnosis not present

## 2024-09-08 DIAGNOSIS — E559 Vitamin D deficiency, unspecified: Secondary | ICD-10-CM | POA: Diagnosis not present

## 2024-09-08 DIAGNOSIS — I10 Essential (primary) hypertension: Secondary | ICD-10-CM

## 2024-09-08 DIAGNOSIS — E785 Hyperlipidemia, unspecified: Secondary | ICD-10-CM

## 2024-09-08 DIAGNOSIS — K5903 Drug induced constipation: Secondary | ICD-10-CM

## 2024-09-08 DIAGNOSIS — R0683 Snoring: Secondary | ICD-10-CM | POA: Diagnosis not present

## 2024-09-08 DIAGNOSIS — G5603 Carpal tunnel syndrome, bilateral upper limbs: Secondary | ICD-10-CM | POA: Diagnosis not present

## 2024-09-08 DIAGNOSIS — E1122 Type 2 diabetes mellitus with diabetic chronic kidney disease: Secondary | ICD-10-CM | POA: Diagnosis not present

## 2024-09-08 NOTE — Patient Instructions (Addendum)
 1.) Visit your local pharmacy to receive your covid and shingles boosters   Use once a day. Fill to top of white section in cap, which is marked to indicate the correct dose (17 g). Stir and dissolve in any 4 to 8 ounces of beverage (cold, hot or room temperature), then drink.

## 2024-09-08 NOTE — Progress Notes (Signed)
 Careteam: Patient Care Team: Caro Harlene POUR, NP as PCP - General (Geriatric Medicine) Anderson Maude ORN, MD (Inactive) as Consulting Physician (Orthopedic Surgery) Ivin Kocher, MD as Consulting Physician (Dermatology) Avram Lupita BRAVO, MD as Consulting Physician (Gastroenterology) Waylan Cain, MD as Consulting Physician (Ophthalmology)  PLACE OF SERVICE:  [facility]  Advanced Directive information    No Known Allergies  Chief Complaint  Patient presents with   Medical Management of Chronic Issues    4 month follow-up. Discussed need for covid booster and shingrix  (local pharmacy)   Medication Problem    Discuss which B/P medication patient is to be taking.     HPI:  Discussed the use of AI scribe software for clinical note transcription with the patient, who gave verbal consent to proceed.  History of Present Illness Danielle Pierce is an 83 year old female with hypertension and diabetes who presents for a four-month follow-up visit.  Her blood pressure has been fluctuating between 140 and 160 mmHg, sometimes after taking her medication in the late evening. She is currently taking losartan  100 mg daily and has discontinued amlodipine  due to leg swelling. She monitors her blood pressure at home.  She is on Ozempic  for diabetes management and has experienced some weight loss. She experiences constipation as a side effect, which she manages with stool softeners.   She has a history of carpal tunnel syndrome and has undergone surgery on one hand. She continues to experience numbness and is following up with her orthopedic specialist.   She mentions low energy and unsteadiness. She reports that others have said nerve issues could cause unsteady walking. No falls have occurred.  Her sleep pattern is irregular, often staying up late and waking early, resulting in insufficient sleep. She occasionally naps during the day. She experiences loud snoring, which has been  noted by others, but has not been evaluated for sleep apnea.  She takes vitamin C daily for immune support and is on vitamin D  and B12 supplements. She has a history of vitamin D  deficiency.    Review of Systems:  Review of Systems  Constitutional:  Negative for chills, fever and weight loss.  HENT:  Negative for tinnitus.   Respiratory:  Negative for cough, sputum production and shortness of breath.   Cardiovascular:  Negative for chest pain, palpitations and leg swelling.  Gastrointestinal:  Negative for abdominal pain, constipation, diarrhea and heartburn.  Genitourinary:  Negative for dysuria, frequency and urgency.  Musculoskeletal:  Negative for back pain, falls, joint pain and myalgias.  Skin: Negative.   Neurological:  Positive for tingling. Negative for dizziness and headaches.  Psychiatric/Behavioral:  Negative for depression and memory loss. The patient does not have insomnia.     Past Medical History:  Diagnosis Date   Arthritis    Ankle   Asymptomatic varicose veins    Benign neoplasm of colon    Bilateral shoulder pain 07/02/14   Cataract    Bilateral - just watching   Cervicitis and endocervicitis 09/13/2011   Disorder of bone and cartilage, unspecified    Disorders of bursae and tendons in shoulder region, unspecified    External hemorrhoids without mention of complication    Fibrocystic breast    Generalized osteoarthrosis, unspecified site    GERD (gastroesophageal reflux disease)    diet controlled, No meds   Hiatal hernia    Hypertension    Impacted cerumen 11/07/2011   Obesity, unspecified    Other diseases of nasal cavity and  sinuses(478.19)    Pain in joint, ankle and foot    Pain in joint, pelvic region and thigh    Pain in joint, shoulder region    Reflux esophagitis    SVD (spontaneous vaginal delivery)    x 2   Unspecified essential hypertension    Unspecified vitamin D  deficiency    Past Surgical History:  Procedure Laterality Date    CARPAL TUNNEL RELEASE Right 07/2024   COLONOSCOPY  2006   Dr.Orr   COLONOSCOPY  07/08/2013   Dr. Avram   FLEXIBLE SIGMOIDOSCOPY  1990   Hemorrhoids    MOUTH SURGERY  2005   DR LUTINS - tooth ext and gum surgery   SHOULDER ARTHROSCOPY W/ ACROMIAL REPAIR Right 07/02/2014   Dr. Anderson   UPPER GASTROINTESTINAL ENDOSCOPY     normal   Social History:   reports that she has never smoked. She has never used smokeless tobacco. She reports current alcohol use. She reports that she does not use drugs.  Family History  Problem Relation Age of Onset   Hypertension Mother    Kidney disease Mother        Renal failure   Cancer Brother        Lymphoma   ADD / ADHD Son    Seizures Son    Hypertension Sister    Dementia Sister    Hypertension Sister    Hypertension Sister    Cancer - Other Sister    Early death Brother        Some type of accident   Colon cancer Father 25   Stomach cancer Neg Hx    Rectal cancer Neg Hx     Medications: Patient's Medications  New Prescriptions   No medications on file  Previous Medications   ACETAMINOPHEN  (TYLENOL ) 650 MG CR TABLET    Take 650 mg by mouth as needed for pain.   ALBUTEROL  (VENTOLIN  HFA) 108 (90 BASE) MCG/ACT INHALER    Inhale 2 puffs into the lungs every 6 (six) hours as needed for wheezing or shortness of breath.   ASPIRIN 81 MG TABLET    Take 81 mg by mouth daily.   ATORVASTATIN  (LIPITOR) 20 MG TABLET    TAKE ONE TABLET BY MOUTH AT BEDTIME   CALCIUM  CARBONATE-VITAMIN D  (CALCIUM -VITAMIN D ) 500-200 MG-UNIT PER TABLET    Take 1 tablet by mouth daily.   CARBOXYMETHYLCELL-HYPROMELLOSE 0.25-0.3 % GEL    Apply 1 application to eye daily. to alleviate irritation.   CHOLECALCIFEROL (VITAMIN D -3 PO)    Take 1 tablet by mouth daily.   HYDROCORTISONE  (ANUSOL -HC) 25 MG SUPPOSITORY    Place 1 suppository (25 mg total) rectally daily as needed for hemorrhoids or anal itching.   LOSARTAN  (COZAAR ) 100 MG TABLET    Take 1 tablet (100 mg total) by  mouth daily.   POLYETHYLENE GLYCOL POWDER (GLYCOLAX /MIRALAX ) 17 GM/SCOOP POWDER    Take 17 g by mouth daily. Dissolve 1 capful (17g) in 4-8 ounces of liquid and take by mouth daily.   PROTEIN POWD    Take 1 Scoop by mouth as needed. Patient decides when to use   SEMAGLUTIDE ,0.25 OR 0.5MG /DOS, (OZEMPIC , 0.25 OR 0.5 MG/DOSE,) 2 MG/3ML SOPN    INJECT 0.25MG  INTO THE SKIN ONCE A WEEK   VITAMIN B-12 (CYANOCOBALAMIN) 500 MCG TABLET    Take 500 mcg by mouth daily.   VITAMIN C (ASCORBIC ACID) 500 MG TABLET    Take 500 mg by mouth daily.   VITAMIN E  400 UNIT CAPSULE    Take 400 Units by mouth daily.  Modified Medications   No medications on file  Discontinued Medications   ACETAMINOPHEN -CODEINE  (TYLENOL  #3) 300-30 MG TABLET    Take 1 tablet by mouth every 6 (six) hours as needed.   AMLODIPINE  (NORVASC ) 10 MG TABLET    Take 1 tablet (10 mg total) by mouth daily.   GUAIFENESIN -DEXTROMETHORPHAN (ROBITUSSIN DM) 100-10 MG/5ML SYRUP    Take 10 mLs by mouth every 6 (six) hours as needed for cough.    Physical Exam:  Vitals:   09/05/24 1212  BP: 136/84  Pulse: (!) 52  Temp: 97.9 F (36.6 C)  SpO2: 99%  Height: 5' 4 (1.626 m)   Body mass index is 41.02 kg/m. Wt Readings from Last 3 Encounters:  08/13/24 239 lb (108.4 kg)  07/25/24 240 lb 9.6 oz (109.1 kg)  06/16/24 243 lb 12.8 oz (110.6 kg)    Physical Exam Constitutional:      General: She is not in acute distress.    Appearance: She is well-developed. She is not diaphoretic.  HENT:     Head: Normocephalic and atraumatic.     Mouth/Throat:     Pharynx: No oropharyngeal exudate.  Eyes:     Conjunctiva/sclera: Conjunctivae normal.     Pupils: Pupils are equal, round, and reactive to light.  Cardiovascular:     Rate and Rhythm: Normal rate and regular rhythm.     Heart sounds: Normal heart sounds.  Pulmonary:     Effort: Pulmonary effort is normal.     Breath sounds: Normal breath sounds.  Abdominal:     General: Bowel sounds are  normal.     Palpations: Abdomen is soft.  Musculoskeletal:     Cervical back: Normal range of motion and neck supple.     Right lower leg: No edema.     Left lower leg: No edema.  Skin:    General: Skin is warm and dry.  Neurological:     Mental Status: She is alert.  Psychiatric:        Mood and Affect: Mood normal.     Labs reviewed: Basic Metabolic Panel: Recent Labs    01/31/24 1456 04/21/24 1413 06/27/24 1937  NA 144 141 139  K 4.2 4.1 3.5  CL 108 105 108  CO2 30 28 23   GLUCOSE 79 100* 93  BUN 12 13 8   CREATININE 1.05* 1.04* 0.89  CALCIUM  9.5 9.5 9.1   Liver Function Tests: Recent Labs    01/31/24 1456 04/21/24 1413 06/27/24 1937  AST 23 25 29   ALT 14 17 15   ALKPHOS  --   --  92  BILITOT 0.7 0.7 0.7  PROT 6.3 6.5 6.6  ALBUMIN  --   --  3.9   No results for input(s): LIPASE, AMYLASE in the last 8760 hours. No results for input(s): AMMONIA in the last 8760 hours. CBC: Recent Labs    10/29/23 0928 01/31/24 1456 04/21/24 1413 06/27/24 1937  WBC 7.5 9.0 7.7 9.0  NEUTROABS 3,600 5,031 4,474  --   HGB 13.3 12.9 13.5 13.4  HCT 40.9 39.6 40.8 39.8  MCV 93.8 93.0 94.9 91.9  PLT 188 208 192 220   Lipid Panel: Recent Labs    04/21/24 1413  CHOL 159  HDL 59  LDLCALC 83  TRIG 79  CHOLHDL 2.7   TSH: No results for input(s): TSH in the last 8760 hours. A1C: Lab Results  Component Value Date  HGBA1C 6.1 (H) 04/21/2024     Assessment/Plan Assessment and Plan Assessment & Plan Essential hypertension Blood pressure improved with losartan . Occasional evening readings of 140-160 mmHg. - Continue losartan  100 mg daily. - Ensure proper blood pressure measurement technique. - Send readings if consistently over 140 mmHg.  Type 2 diabetes mellitus with diabetic chronic kidney disease stage 3a Diabetes managed with Ozempic , aiding weight loss. CKD stage 3a requires monitoring. - Continue Ozempic . - Ordered A1c test. - Ensure up-to-date eye  and diabetic foot exams. - Maintain low sugar and low carbohydrate diet. - Advised hydration and avoidance of NSAIDs.  Morbid obesity BMI 41. Weight management with Ozempic  and dietary changes. - Continue Ozempic . - Encouraged dietary changes and increased physical activity.  Drug-induced constipation Constipation from Ozempic  managed with stool softeners - Add Miralax , one capful daily, in a lower sugar beverage.  Hyperlipidemia LDL 83 mg/dL. Goal LDL <70 mg/dL due to diabetes. Current treatment Lipitor 20 mg. - Ordered cholesterol test. - Consider increasing Lipitor to 40 mg if LDL not at goal.  Snores  Loud snoring and daytime fatigue suggest OSA. No prior sleep study. - Referred to pulmonary for OSA evaluation. - Recommended good sleep hygiene.  Carpal tunnel syndrome, bilateral (status post right carpal tunnel release) Status post right release. Left hand symptoms persist. - Continue follow-up with orthopedic for left hand symptoms.  Vitamin D  deficiency Levels need monitoring. - Ordered vitamin D  level test. - Continue vitamin D  supplementation.  Return in about 6 months (around 03/09/2025) for routine follow up.  Bertis Hustead K. Caro BODILY [facility] [phone]

## 2024-09-09 ENCOUNTER — Ambulatory Visit: Payer: Self-pay | Admitting: Nurse Practitioner

## 2024-09-09 LAB — LIPID PANEL
Cholesterol: 157 mg/dL (ref <200)
HDL: 60 mg/dL (ref >=50)
LDL Cholesterol (Calc): 83 mg/dL
Non-HDL Cholesterol (Calc): 97 mg/dL (ref <130)
Total CHOL/HDL Ratio: 2.6 (calc) (ref <5.0)
Triglycerides: 63 mg/dL (ref <150)

## 2024-09-09 LAB — COMPREHENSIVE METABOLIC PANEL WITH GFR
AG Ratio: 2.2 (calc) (ref 1.0–2.5)
ALT: 15 U/L (ref 6–29)
AST: 25 U/L (ref 10–35)
Albumin: 4.4 g/dL (ref 3.6–5.1)
Alkaline phosphatase (APISO): 100 U/L (ref 37–153)
BUN: 12 mg/dL (ref 7–25)
CO2: 26 mmol/L (ref 20–32)
Calcium: 9.5 mg/dL (ref 8.6–10.4)
Chloride: 107 mmol/L (ref 98–110)
Creat: 0.95 mg/dL (ref 0.60–0.95)
Globulin: 2 g/dL (ref 1.9–3.7)
Glucose, Bld: 98 mg/dL (ref 65–99)
Potassium: 4.3 mmol/L (ref 3.5–5.3)
Sodium: 141 mmol/L (ref 135–146)
Total Bilirubin: 0.5 mg/dL (ref 0.2–1.2)
Total Protein: 6.4 g/dL (ref 6.1–8.1)
eGFR: 60 mL/min/1.73m2 (ref >=60)

## 2024-09-09 LAB — CBC WITH DIFFERENTIAL/PLATELET
Absolute Lymphocytes: 2402 {cells}/uL (ref 850–3900)
Absolute Monocytes: 616 {cells}/uL (ref 200–950)
Basophils Absolute: 32 {cells}/uL (ref 0–200)
Basophils Relative: 0.4 %
Eosinophils Absolute: 198 {cells}/uL (ref 15–500)
Eosinophils Relative: 2.5 %
HCT: 37.4 % (ref 35.9–46.0)
Hemoglobin: 12.2 g/dL (ref 11.7–15.5)
MCH: 30.7 pg (ref 27.0–33.0)
MCHC: 32.6 g/dL (ref 31.6–35.4)
MCV: 94 fL (ref 81.4–101.7)
MPV: 11.1 fL (ref 7.5–12.5)
Monocytes Relative: 7.8 %
Neutro Abs: 4653 {cells}/uL (ref 1500–7800)
Neutrophils Relative %: 58.9 %
Platelets: 216 Thousand/uL (ref 140–400)
RBC: 3.98 Million/uL (ref 3.80–5.10)
RDW: 14.2 % (ref 11.0–15.0)
Total Lymphocyte: 30.4 %
WBC: 7.9 Thousand/uL (ref 3.8–10.8)

## 2024-09-09 LAB — HEMOGLOBIN A1C
Hgb A1c MFr Bld: 5.7 % — ABNORMAL HIGH (ref <5.7)
Mean Plasma Glucose: 117 mg/dL
eAG (mmol/L): 6.5 mmol/L

## 2024-09-09 LAB — VITAMIN D 25 HYDROXY (VIT D DEFICIENCY, FRACTURES): Vit D, 25-Hydroxy: 28 ng/mL — ABNORMAL LOW (ref 30–100)

## 2024-09-19 NOTE — Progress Notes (Unsigned)
" ° °  Danielle Pierce - 83 y.o. female MRN [id number]  Date of birth: 06-25-1941  Office Visit Note: Visit Date: 09/22/2024 PCP: Caro Harlene POUR, NP Referred by: Caro Harlene POUR, NP  Subjective:  HPI: Danielle Pierce is a 83 y.o. female who presents today for follow up 12 weeks status post right wrist open carpal tunnel release.  Pertinent ROS were reviewed with the patient and found to be negative unless otherwise specified above in HPI.   Assessment & Plan: Visit Diagnoses: No diagnosis found.  Plan: ***  Follow-up: No follow-ups on file.   Meds & Orders: No orders of the defined types were placed in this encounter.  No orders of the defined types were placed in this encounter.    Procedures: No procedures performed       Objective:   Vital Signs: There were no vitals taken for this visit.  Ortho Exam ***  Imaging: No results found.   Gabreille Dardis Afton Alderton, M.D. [facility], Hand Surgery  "

## 2024-09-22 ENCOUNTER — Ambulatory Visit: Admitting: Orthopedic Surgery

## 2024-09-22 DIAGNOSIS — Z9889 Other specified postprocedural states: Secondary | ICD-10-CM | POA: Diagnosis not present

## 2024-09-22 DIAGNOSIS — G5603 Carpal tunnel syndrome, bilateral upper limbs: Secondary | ICD-10-CM

## 2024-09-22 DIAGNOSIS — G5602 Carpal tunnel syndrome, left upper limb: Secondary | ICD-10-CM

## 2024-09-30 ENCOUNTER — Ambulatory Visit (INDEPENDENT_AMBULATORY_CARE_PROVIDER_SITE_OTHER): Admitting: Pulmonary Disease

## 2024-09-30 ENCOUNTER — Encounter: Payer: Self-pay | Admitting: Pulmonary Disease

## 2024-09-30 VITALS — BP 156/88 | HR 73 | Ht 64.0 in | Wt 239.0 lb

## 2024-09-30 DIAGNOSIS — R0683 Snoring: Secondary | ICD-10-CM

## 2024-09-30 DIAGNOSIS — Z6841 Body Mass Index (BMI) 40.0 and over, adult: Secondary | ICD-10-CM

## 2024-09-30 NOTE — Progress Notes (Signed)
 "  Synopsis: Referred in 09/2024 for snoring and sleepiness by Caro Harlene POUR, NP  Subjective:   PATIENT ID: Danielle Pierce GENDER: female DOB: [birth year], MRN: [id number]  Chief Complaint  Patient presents with   Consult    Patient was referred by pcp.Snoring     HPI  Danielle Pierce is a 83 y.o. patient with hypertension, T2DM and obesity (BMI 41) who presents today for evaluation of snoring and excessive daytime sleepiness.  Sleep habits: - Snoring: Yes - Bedtime: Midnight - 2 am - Sleep latency: 20 minutes - Number of awakenings per night: 1x/night - Wake time: 9 am - EDS symptoms: Yes - Sleep aids: None - Stimulant use: None - H/o MVAs / drowsy driving: None -- she reports that even during a long 3 hr drive she does about once per month she does not experience sleepiness, yet she does get sleepy at home on sofa during daytime.  Comorbidities: - Hypertension: Yes - Diabetes: Yes - Obesity: Yes  Other: - GLP-1 use: Yes - semaglutide .  Pulm Questionnaires:     09/30/2024    1:00 PM  Results of the Epworth flowsheet  Sitting and reading 3  Watching TV 2  Sitting, inactive in a public place (e.g. a theatre or a meeting) 2  As a passenger in a car for an hour without a break 1  Lying down to rest in the afternoon when circumstances permit 0  Sitting and talking to someone 0  Sitting quietly after a lunch without alcohol 1  In a car, while stopped for a few minutes in traffic 0  Total score 9     Past Medical History:  Diagnosis Date   Arthritis    Ankle   Asymptomatic varicose veins    Benign neoplasm of colon    Bilateral shoulder pain 07/02/14   Cataract    Bilateral - just watching   Cervicitis and endocervicitis 09/13/2011   Disorder of bone and cartilage, unspecified    Disorders of bursae and tendons in shoulder region, unspecified    External hemorrhoids without mention of complication    Fibrocystic breast    Generalized  osteoarthrosis, unspecified site    GERD (gastroesophageal reflux disease)    diet controlled, No meds   Hiatal hernia    Hypertension    Impacted cerumen 11/07/2011   Obesity, unspecified    Other diseases of nasal cavity and sinuses(478.19)    Pain in joint, ankle and foot    Pain in joint, pelvic region and thigh    Pain in joint, shoulder region    Reflux esophagitis    SVD (spontaneous vaginal delivery)    x 2   Unspecified essential hypertension    Unspecified vitamin D  deficiency      Family History  Problem Relation Age of Onset   Hypertension Mother    Kidney disease Mother        Renal failure   Cancer Brother        Lymphoma   ADD / ADHD Son    Seizures Son    Hypertension Sister    Dementia Sister    Hypertension Sister    Hypertension Sister    Cancer - Other Sister    Early death Brother        Some type of accident   Colon cancer Father 12   Stomach cancer Neg Hx    Rectal cancer Neg Hx      Past Surgical History:  Procedure Laterality Date   CARPAL TUNNEL RELEASE Right 07/2024   COLONOSCOPY  2006   Dr.Orr   COLONOSCOPY  07/08/2013   Dr. Avram   FLEXIBLE SIGMOIDOSCOPY  1990   Hemorrhoids    MOUTH SURGERY  2005   DR LUTINS - tooth ext and gum surgery   SHOULDER ARTHROSCOPY W/ ACROMIAL REPAIR Right 07/02/2014   Dr. Anderson   UPPER GASTROINTESTINAL ENDOSCOPY     normal    Social History   Socioeconomic History   Marital status: Widowed    Spouse name: Not on file   Number of children: Not on file   Years of education: Not on file   Highest education level: Not on file  Occupational History   Occupation: retired Camera Operator  Tobacco Use   Smoking status: Never   Smokeless tobacco: Never  Vaping Use   Vaping status: Never Used  Substance and Sexual Activity   Alcohol use: Yes    Comment: occasional mixed drink    Drug use: No   Sexual activity: Not Currently    Birth control/protection: Post-menopausal  Other  Topics Concern   Not on file  Social History Narrative   Married    Never smoked   Alcohlol none   Exercise treadmill 3 times a week    Social Drivers of Health   Tobacco Use: Low Risk (09/30/2024)   Patient History    Smoking Tobacco Use: Never    Smokeless Tobacco Use: Never    Passive Exposure: Not on file  Financial Resource Strain: Not on file  Food Insecurity: Not on file  Transportation Needs: Not on file  Physical Activity: Not on file  Stress: Not on file  Social Connections: Not on file  Intimate Partner Violence: Not on file  Depression (PHQ2-9): Low Risk (07/24/2024)   Depression (PHQ2-9)    PHQ-2 Score: 0  Alcohol Screen: Not on file  Housing: Not on file  Utilities: Not on file  Health Literacy: Not on file     Allergies[1]   Outpatient Medications Prior to Visit  Medication Sig Dispense Refill   acetaminophen  (TYLENOL ) 650 MG CR tablet Take 650 mg by mouth as needed for pain.     albuterol  (VENTOLIN  HFA) 108 (90 Base) MCG/ACT inhaler Inhale 2 puffs into the lungs every 6 (six) hours as needed for wheezing or shortness of breath. 8 g 0   aspirin 81 MG tablet Take 81 mg by mouth daily.     atorvastatin  (LIPITOR) 20 MG tablet TAKE ONE TABLET BY MOUTH AT BEDTIME 90 tablet 3   Calcium  Carbonate-Vitamin D  (CALCIUM -VITAMIN D ) 500-200 MG-UNIT per tablet Take 1 tablet by mouth daily.     Carboxymethylcell-Hypromellose 0.25-0.3 % GEL Apply 1 application to eye daily. to alleviate irritation.     Cholecalciferol (VITAMIN D -3 PO) Take 1 tablet by mouth daily.     hydrocortisone  (ANUSOL -HC) 25 MG suppository Place 1 suppository (25 mg total) rectally daily as needed for hemorrhoids or anal itching. 30 suppository 0   losartan  (COZAAR ) 100 MG tablet Take 1 tablet (100 mg total) by mouth daily. 90 tablet 1   polyethylene glycol powder (GLYCOLAX /MIRALAX ) 17 GM/SCOOP powder Take 17 g by mouth daily. Dissolve 1 capful (17g) in 4-8 ounces of liquid and take by mouth daily. 510  g 1   Protein POWD Take 1 Scoop by mouth as needed. Patient decides when to use     Semaglutide ,0.25 or 0.5MG /DOS, (OZEMPIC , 0.25 OR 0.5 MG/DOSE,) 2 MG/3ML SOPN  INJECT 0.25MG  INTO THE SKIN ONCE A WEEK 23 mL 1   vitamin B-12 (CYANOCOBALAMIN) 500 MCG tablet Take 500 mcg by mouth daily.     vitamin C (ASCORBIC ACID) 500 MG tablet Take 500 mg by mouth daily.     vitamin E 400 UNIT capsule Take 400 Units by mouth daily.     No facility-administered medications prior to visit.    ROS   Objective:  Physical Exam Constitutional:      Appearance: Normal appearance. She is obese.  HENT:     Head: Normocephalic and atraumatic.     Mouth/Throat:     Mouth: Mucous membranes are moist.     Pharynx: Oropharynx is clear. No oropharyngeal exudate or posterior oropharyngeal erythema.     Comments: MP IV. No tongue scalloping. Eyes:     General: No scleral icterus.    Conjunctiva/sclera: Conjunctivae normal.  Cardiovascular:     Rate and Rhythm: Normal rate and regular rhythm.  Pulmonary:     Effort: Pulmonary effort is normal. No respiratory distress.  Musculoskeletal:     Right lower leg: No edema.     Left lower leg: No edema.     Comments: 1+ non-pitting edema present in bilateral LE, but no pitting edema.  Neurological:     Mental Status: She is alert and oriented to person, place, and time.  Psychiatric:        Mood and Affect: Mood normal.        Behavior: Behavior normal.        Thought Content: Thought content normal.      Vitals:   09/30/24 1318 09/30/24 1401  BP: (!) 160/88 (!) 156/88  Pulse: 73   TempSrc: Oral   SpO2: 98%   Weight: 239 lb (108.4 kg)   Height: 5' 4 (1.626 m)    98% on RA BMI Readings from Last 3 Encounters:  09/30/24 41.02 kg/m  09/05/24 41.02 kg/m  08/13/24 41.02 kg/m   Wt Readings from Last 3 Encounters:  09/30/24 239 lb (108.4 kg)  08/13/24 239 lb (108.4 kg)  07/25/24 240 lb 9.6 oz (109.1 kg)     CBC    Component Value Date/Time    WBC 7.9 09/08/2024 1107   RBC 3.98 09/08/2024 1107   HGB 12.2 09/08/2024 1107   HCT 37.4 09/08/2024 1107   PLT 216 09/08/2024 1107   MCV 94.0 09/08/2024 1107   MCH 30.7 09/08/2024 1107   MCHC 32.6 09/08/2024 1107   RDW 14.2 09/08/2024 1107   RDW 14.9 03/07/2013 1120   LYMPHSABS 2,253 04/23/2023 0947   LYMPHSABS 2.9 03/07/2013 1120   EOSABS 198 09/08/2024 1107   EOSABS 0.1 03/07/2013 1120   BASOSABS 32 09/08/2024 1107   BASOSABS 0.0 03/07/2013 1120    Chest Imaging: None  Pulmonary Functions Testing Results:     No data to display         Echocardiogram: None  Sleep Studies: None    Assessment & Plan:     ICD-10-CM   1. Snoring  R06.83 Home sleep test    2. Morbid obesity due to excess calories (HCC)  E66.01 Home sleep test      Discussion:  STOPBANG score is 5/8. Mallampati IV airway. (+) HTN. BMI 41. Sleepy. High risk for moderate-to-severe OSA.  HST ordered. Counseled patient against drowsy driving.  While some apprehension about CPAP, she is open to trying it if HST shows sleep apnea. She finds reassurance in fact that her daughter  uses a CPAP device and seems to benefit from it.  Regarding obesity -- which is the main suspected driver of her snoring / sleep pathology -- she is on semaglutide  at present. Depending on OSA severity (if diagnosed), switching to Zepbound could be considered. Will re-evaluate at follow up.  Late bedtime may represent delayed circadian phase vs secondary effect of OSA. Observation only at this time.   Current Medications[2]   Follow up: Office will call patient once result is available and schedule appropriate follow up at that time.  Time spent on day of this encounter (includes time spent face-to-face with the patient as well as time spent the same day as the encounter reviewing existing data and notes, and/or documenting my findings and the plan of care): 30 minutes  Lamar Dales, MD Pulmonary, Critical Care & Sleep  [facility] 09/30/2024 2:03 PM     [1] No Known Allergies [2]  Current Outpatient Medications:    acetaminophen  (TYLENOL ) 650 MG CR tablet, Take 650 mg by mouth as needed for pain., Disp: , Rfl:    albuterol  (VENTOLIN  HFA) 108 (90 Base) MCG/ACT inhaler, Inhale 2 puffs into the lungs every 6 (six) hours as needed for wheezing or shortness of breath., Disp: 8 g, Rfl: 0   aspirin 81 MG tablet, Take 81 mg by mouth daily., Disp: , Rfl:    atorvastatin  (LIPITOR) 20 MG tablet, TAKE ONE TABLET BY MOUTH AT BEDTIME, Disp: 90 tablet, Rfl: 3   Calcium  Carbonate-Vitamin D  (CALCIUM -VITAMIN D ) 500-200 MG-UNIT per tablet, Take 1 tablet by mouth daily., Disp: , Rfl:    Carboxymethylcell-Hypromellose 0.25-0.3 % GEL, Apply 1 application to eye daily. to alleviate irritation., Disp: , Rfl:    Cholecalciferol (VITAMIN D -3 PO), Take 1 tablet by mouth daily., Disp: , Rfl:    hydrocortisone  (ANUSOL -HC) 25 MG suppository, Place 1 suppository (25 mg total) rectally daily as needed for hemorrhoids or anal itching., Disp: 30 suppository, Rfl: 0   losartan  (COZAAR ) 100 MG tablet, Take 1 tablet (100 mg total) by mouth daily., Disp: 90 tablet, Rfl: 1   polyethylene glycol powder (GLYCOLAX /MIRALAX ) 17 GM/SCOOP powder, Take 17 g by mouth daily. Dissolve 1 capful (17g) in 4-8 ounces of liquid and take by mouth daily., Disp: 510 g, Rfl: 1   Protein POWD, Take 1 Scoop by mouth as needed. Patient decides when to use, Disp: , Rfl:    Semaglutide ,0.25 or 0.5MG /DOS, (OZEMPIC , 0.25 OR 0.5 MG/DOSE,) 2 MG/3ML SOPN, INJECT 0.25MG  INTO THE SKIN ONCE A WEEK, Disp: 23 mL, Rfl: 1   vitamin B-12 (CYANOCOBALAMIN) 500 MCG tablet, Take 500 mcg by mouth daily., Disp: , Rfl:    vitamin C (ASCORBIC ACID) 500 MG tablet, Take 500 mg by mouth daily., Disp: , Rfl:    vitamin E 400 UNIT capsule, Take 400 Units by mouth daily., Disp: , Rfl:   "

## 2024-09-30 NOTE — Patient Instructions (Signed)
  You will receive a call to schedule your Sleep Study.  ----------   There is no need to schedule a follow up visit today. Our office will call you with the results and next steps, and you will schedule your next visit at that time.  ----------

## 2024-10-06 ENCOUNTER — Other Ambulatory Visit: Payer: Self-pay

## 2024-10-06 ENCOUNTER — Ambulatory Visit: Admitting: Physical Medicine and Rehabilitation

## 2024-10-06 VITALS — BP 161/88 | HR 75

## 2024-10-06 DIAGNOSIS — M5412 Radiculopathy, cervical region: Secondary | ICD-10-CM

## 2024-10-06 MED ORDER — METHYLPREDNISOLONE ACETATE 40 MG/ML IJ SUSP
40.0000 mg | Freq: Once | INTRAMUSCULAR | Status: AC
  Administered 2024-10-06: 40 mg

## 2024-10-06 NOTE — Progress Notes (Signed)
 Pain Scale   Average Pain 7 Patient advising she has chronic neck pain radiating to bilateral shoulder and arms, patient advising her pain is constant.        +Driver, -BT, -Dye Allergies.

## 2024-10-06 NOTE — Progress Notes (Signed)
 "  Danielle Pierce - 84 y.o. female MRN [id number]  Date of birth: December 09, 1940  Office Visit Note: Visit Date: 10/06/2024 PCP: Caro Harlene POUR, NP Referred by: Caro Harlene POUR, NP  Subjective: Chief Complaint  Patient presents with   Neck - Pain   HPI:  Danielle Pierce is a 84 y.o. female who comes in today for planned repeat Left C7-T1  Cervical Interlaminar epidural steroid injection with fluoroscopic guidance.  The patient has failed conservative care including home exercise, medications, time and activity modification.  This injection will be diagnostic and hopefully therapeutic.  Please see requesting physician notes for further details and justification. Patient received more than 50% pain relief from prior injection. Underwent CTR on the right with continued symptoms and plans for left CTR. Prior electrodiagnostic study showed moderate severe Right and moderate Left.   Referring: Duwaine Pouch, FNP   ROS Otherwise per HPI.  Assessment & Plan: Visit Diagnoses:    ICD-10-CM   1. Cervical radiculopathy  M54.12 XR C-ARM NO REPORT    Epidural Steroid injection    methylPREDNISolone  acetate (DEPO-MEDROL ) injection 40 mg      Plan: No additional findings.   Meds & Orders:  Meds ordered this encounter  Medications   methylPREDNISolone  acetate (DEPO-MEDROL ) injection 40 mg    Orders Placed This Encounter  Procedures   XR C-ARM NO REPORT   Epidural Steroid injection    Follow-up: Return for visit to requesting provider as needed.   Procedures: No procedures performed  Cervical Epidural Steroid Injection - Interlaminar Approach with Fluoroscopic Guidance  Patient: Danielle Pierce      Date of Birth: [birth year] MRN: [id number] PCP: Caro Harlene POUR, NP      Visit Date: 10/06/2024   Universal Protocol:    Date/Time: 1/5/20269:57 AM  Consent Given By: the patient  Position: PRONE  Additional Comments: Vital signs were monitored before and after the  procedure. Patient was prepped and draped in the usual sterile fashion. The correct patient, procedure, and site was verified.   Injection Procedure Details:   Procedure diagnoses: Cervical radiculopathy [M54.12]    Meds Administered:  Meds ordered this encounter  Medications   methylPREDNISolone  acetate (DEPO-MEDROL ) injection 40 mg     Laterality: Left  Location/Site: C7-T1  Needle: 3.5 in., 20 ga. Tuohy  Needle Placement: Paramedian epidural space  Findings:  -Comments: Excellent flow of contrast into the epidural space.  Procedure Details: Using a paramedian approach from the side mentioned above, the region overlying the inferior lamina was localized under fluoroscopic visualization and the soft tissues overlying this structure were infiltrated with 4 ml. of 1% Lidocaine  without Epinephrine. A # 20 gauge, Tuohy needle was inserted into the epidural space using a paramedian approach.  The epidural space was localized using loss of resistance along with contralateral oblique bi-planar fluoroscopic views.  After negative aspirate for air, blood, and CSF, a 2 ml. volume of Isovue-250 was injected into the epidural space and the flow of contrast was observed. Radiographs were obtained for documentation purposes.   The injectate was administered into the level noted above.  Additional Comments:  The patient tolerated the procedure well Dressing: 2 x 2 sterile gauze and Band-Aid    Post-procedure details: Patient was observed during the procedure. Post-procedure instructions were reviewed.  Patient left the clinic in stable condition.    Clinical History: Narrative & Impression CLINICAL DATA:  Weakness in the neck, shoulders and hands for approximately 4 weeks. No known injury.  EXAM: MRI CERVICAL SPINE WITHOUT CONTRAST   TECHNIQUE: Multiplanar, multisequence MR imaging of the cervical spine was performed. No intravenous contrast was administered.   COMPARISON:   Plain film cervical spine 02/08/2024.   FINDINGS: Alignment: There is straightening of the normal cervical lordosis and trace anterolisthesis C7 on T1.   Vertebrae: No fracture, evidence of discitis, or bone lesion.   Cord: Mild, hazy edema is seen within the cord at the C3-4 level.   Posterior Fossa, vertebral arteries, paraspinal tissues: Negative.   Disc levels:   C2-3: Small central disc protrusion and mild to moderate facet degenerative change. The central canal and foramina remain open.   C3-4: Disc osteophyte complex with a superimposed central disc protrusion which appears to be calcified. The cord is severely flattened. There is moderately severe to severe bilateral foraminal narrowing, worse on the left. Facet arthropathy is also worse on the left with mild marrow edema seen in the left facets.   C4-5: Disc osteophyte complex and uncovertebral spurring. The ventral cord is flattened. Moderately severe to severe foraminal narrowing is worse on the left.   C5-6: Disc osteophyte complex flattens the ventral cord. Uncovertebral and facet arthropathy cause moderately severe to severe foraminal narrowing, worse on the right.   C6-7: Shallow disc bulge and uncovertebral spurring. The central canal is open. Mild bilateral foraminal narrowing is present.   C7-T1: The disc is uncovered with a superimposed central and right paracentral protrusion. The ventral thecal sac is nearly effaced. Mild right foraminal narrowing is seen. The left foramen is open. Bilateral facet arthropathy is present with secondary mild marrow edema in the right facets.   IMPRESSION: 1. Severe central canal stenosis at C3-4 where a central disc protrusion appears to be calcified. The cord is severely flattened and there is mild edema in the cord at this level. 2. Flattening of the ventral cord at C4-5 and C5-6. 3. Moderately severe to severe foraminal narrowing bilaterally at C3-4, C4-5 and  C5-6. 4. Central and right paracentral protrusion at C7-T1 nearly effaces the ventral thecal sac. 5. Facet arthropathy at C3-4 and C7-T1 with secondary mild marrow edema in the facets.     Electronically Signed   By: Debby Prader M.D.   On: 04/03/2024 10:41     Objective:  VS:  HT:    WT:   BMI:     BP:(!) 161/88  HR:75bpm  TEMP: ( )  RESP:  Physical Exam Vitals and nursing note reviewed.  Constitutional:      General: She is not in acute distress.    Appearance: Normal appearance. She is obese. She is not ill-appearing.  HENT:     Head: Normocephalic and atraumatic.     Right Ear: External ear normal.     Left Ear: External ear normal.  Eyes:     Extraocular Movements: Extraocular movements intact.  Cardiovascular:     Rate and Rhythm: Normal rate.     Pulses: Normal pulses.  Musculoskeletal:     Cervical back: Tenderness present. No rigidity.     Right lower leg: No edema.     Left lower leg: No edema.     Comments: Patient has good strength in the upper extremities including 5 out of 5 strength in wrist extension long finger flexion and APB.  There is no atrophy of the hands intrinsically.  There is a negative Hoffmann's test.   Lymphadenopathy:     Cervical: No cervical adenopathy.  Skin:    Findings: No  erythema, lesion or rash.  Neurological:     General: No focal deficit present.     Mental Status: She is alert and oriented to person, place, and time.     Sensory: No sensory deficit.     Motor: No weakness or abnormal muscle tone.     Coordination: Coordination normal.  Psychiatric:        Mood and Affect: Mood normal.        Behavior: Behavior normal.      Imaging: No results found. "

## 2024-10-06 NOTE — Procedures (Signed)
 Cervical Epidural Steroid Injection - Interlaminar Approach with Fluoroscopic Guidance  Patient: Danielle Pierce      Date of Birth: 1941-09-07 MRN: [id number] PCP: Caro Harlene POUR, NP      Visit Date: 10/06/2024   Universal Protocol:    Date/Time: 1/5/20269:57 AM  Consent Given By: the patient  Position: PRONE  Additional Comments: Vital signs were monitored before and after the procedure. Patient was prepped and draped in the usual sterile fashion. The correct patient, procedure, and site was verified.   Injection Procedure Details:   Procedure diagnoses: Cervical radiculopathy [M54.12]    Meds Administered:  Meds ordered this encounter  Medications   methylPREDNISolone  acetate (DEPO-MEDROL ) injection 40 mg     Laterality: Left  Location/Site: C7-T1  Needle: 3.5 in., 20 ga. Tuohy  Needle Placement: Paramedian epidural space  Findings:  -Comments: Excellent flow of contrast into the epidural space.  Procedure Details: Using a paramedian approach from the side mentioned above, the region overlying the inferior lamina was localized under fluoroscopic visualization and the soft tissues overlying this structure were infiltrated with 4 ml. of 1% Lidocaine  without Epinephrine. A # 20 gauge, Tuohy needle was inserted into the epidural space using a paramedian approach.  The epidural space was localized using loss of resistance along with contralateral oblique bi-planar fluoroscopic views.  After negative aspirate for air, blood, and CSF, a 2 ml. volume of Isovue-250 was injected into the epidural space and the flow of contrast was observed. Radiographs were obtained for documentation purposes.   The injectate was administered into the level noted above.  Additional Comments:  The patient tolerated the procedure well Dressing: 2 x 2 sterile gauze and Band-Aid    Post-procedure details: Patient was observed during the procedure. Post-procedure instructions were  reviewed.  Patient left the clinic in stable condition.

## 2024-10-15 ENCOUNTER — Ambulatory Visit: Admitting: Orthopedic Surgery

## 2024-10-30 ENCOUNTER — Other Ambulatory Visit: Payer: Self-pay

## 2024-10-30 DIAGNOSIS — G5603 Carpal tunnel syndrome, bilateral upper limbs: Secondary | ICD-10-CM

## 2024-11-10 ENCOUNTER — Ambulatory Visit: Admitting: Orthopedic Surgery

## 2024-11-12 ENCOUNTER — Encounter: Admitting: Rehabilitative and Restorative Service Providers"

## 2024-11-20 ENCOUNTER — Encounter: Admitting: Orthopedic Surgery

## 2024-12-04 ENCOUNTER — Encounter: Admitting: Rehabilitative and Restorative Service Providers"

## 2024-12-11 ENCOUNTER — Encounter: Admitting: Orthopedic Surgery

## 2025-03-09 ENCOUNTER — Ambulatory Visit: Admitting: Nurse Practitioner

## 2025-07-28 ENCOUNTER — Ambulatory Visit: Payer: Self-pay | Admitting: Nurse Practitioner
